# Patient Record
Sex: Male | Born: 1960 | Race: White | Hispanic: No | Marital: Single | Smoking: Current every day smoker
Health system: Southern US, Community
[De-identification: ages and names within clinical notes are randomized; demographics above are authoritative.]

## PROBLEM LIST (undated history)

## (undated) DIAGNOSIS — E785 Hyperlipidemia, unspecified: Secondary | ICD-10-CM

## (undated) DIAGNOSIS — E119 Type 2 diabetes mellitus without complications: Secondary | ICD-10-CM

## (undated) DIAGNOSIS — Z720 Tobacco use: Secondary | ICD-10-CM

## (undated) DIAGNOSIS — R0602 Shortness of breath: Secondary | ICD-10-CM

## (undated) DIAGNOSIS — E162 Hypoglycemia, unspecified: Secondary | ICD-10-CM

## (undated) DIAGNOSIS — I2510 Atherosclerotic heart disease of native coronary artery without angina pectoris: Secondary | ICD-10-CM

## (undated) DIAGNOSIS — I10 Essential (primary) hypertension: Secondary | ICD-10-CM

## (undated) DIAGNOSIS — E559 Vitamin D deficiency, unspecified: Secondary | ICD-10-CM

## (undated) DIAGNOSIS — F329 Major depressive disorder, single episode, unspecified: Secondary | ICD-10-CM

## (undated) DIAGNOSIS — R45851 Suicidal ideations: Secondary | ICD-10-CM

## (undated) DIAGNOSIS — F1911 Other psychoactive substance abuse, in remission: Secondary | ICD-10-CM

## (undated) DIAGNOSIS — F32A Depression, unspecified: Secondary | ICD-10-CM

## (undated) DIAGNOSIS — R609 Edema, unspecified: Secondary | ICD-10-CM

## (undated) DIAGNOSIS — J4489 Other specified chronic obstructive pulmonary disease: Secondary | ICD-10-CM

## (undated) DIAGNOSIS — K219 Gastro-esophageal reflux disease without esophagitis: Secondary | ICD-10-CM

## (undated) DIAGNOSIS — J449 Chronic obstructive pulmonary disease, unspecified: Secondary | ICD-10-CM

## (undated) DIAGNOSIS — R079 Chest pain, unspecified: Secondary | ICD-10-CM

## (undated) HISTORY — DX: Essential (primary) hypertension: I10

## (undated) HISTORY — DX: Shortness of breath: R06.02

## (undated) HISTORY — PX: LOOP RECORDER IMPLANT: SHX5954

## (undated) HISTORY — DX: Edema, unspecified: R60.9

## (undated) HISTORY — DX: Atherosclerotic heart disease of native coronary artery without angina pectoris: I25.10

## (undated) HISTORY — DX: Other psychoactive substance abuse, in remission: F19.11

## (undated) HISTORY — DX: Vitamin D deficiency, unspecified: E55.9

## (undated) HISTORY — PX: OTHER SURGICAL HISTORY: SHX169

## (undated) HISTORY — DX: Suicidal ideations: R45.851

## (undated) HISTORY — DX: Chronic obstructive pulmonary disease, unspecified: J44.9

## (undated) HISTORY — PX: CHOLECYSTECTOMY: SHX55

## (undated) HISTORY — DX: Tobacco use: Z72.0

## (undated) HISTORY — DX: Hyperlipidemia, unspecified: E78.5

## (undated) HISTORY — DX: Chest pain, unspecified: R07.9

## (undated) HISTORY — DX: Major depressive disorder, single episode, unspecified: F32.9

## (undated) HISTORY — DX: Gastro-esophageal reflux disease without esophagitis: K21.9

## (undated) HISTORY — DX: Other specified chronic obstructive pulmonary disease: J44.89

## (undated) HISTORY — DX: Depression, unspecified: F32.A

## (undated) HISTORY — DX: Hypoglycemia, unspecified: E16.2

---

## 1999-05-09 ENCOUNTER — Ambulatory Visit (HOSPITAL_COMMUNITY): Admission: RE | Admit: 1999-05-09 | Discharge: 1999-05-09 | Payer: Self-pay | Admitting: Neurosurgery

## 1999-05-09 ENCOUNTER — Encounter: Payer: Self-pay | Admitting: Neurosurgery

## 1999-05-19 ENCOUNTER — Encounter: Payer: Self-pay | Admitting: Neurosurgery

## 1999-05-21 ENCOUNTER — Encounter: Payer: Self-pay | Admitting: Neurosurgery

## 1999-05-21 ENCOUNTER — Inpatient Hospital Stay (HOSPITAL_COMMUNITY): Admission: RE | Admit: 1999-05-21 | Discharge: 1999-05-22 | Payer: Self-pay | Admitting: Neurosurgery

## 1999-11-20 ENCOUNTER — Ambulatory Visit (HOSPITAL_COMMUNITY): Admission: RE | Admit: 1999-11-20 | Discharge: 1999-11-20 | Payer: Self-pay | Admitting: Neurosurgery

## 1999-11-20 ENCOUNTER — Encounter: Payer: Self-pay | Admitting: Neurosurgery

## 2000-02-23 ENCOUNTER — Ambulatory Visit (HOSPITAL_COMMUNITY): Admission: RE | Admit: 2000-02-23 | Discharge: 2000-02-23 | Payer: Self-pay | Admitting: Neurosurgery

## 2000-02-23 ENCOUNTER — Encounter: Payer: Self-pay | Admitting: Neurosurgery

## 2000-03-07 ENCOUNTER — Encounter: Payer: Self-pay | Admitting: Neurosurgery

## 2000-03-07 ENCOUNTER — Inpatient Hospital Stay (HOSPITAL_COMMUNITY): Admission: RE | Admit: 2000-03-07 | Discharge: 2000-03-10 | Payer: Self-pay | Admitting: Neurosurgery

## 2001-05-25 ENCOUNTER — Emergency Department (HOSPITAL_COMMUNITY): Admission: EM | Admit: 2001-05-25 | Discharge: 2001-05-25 | Payer: Self-pay | Admitting: Emergency Medicine

## 2001-05-25 ENCOUNTER — Encounter: Payer: Self-pay | Admitting: Emergency Medicine

## 2002-01-14 ENCOUNTER — Encounter: Payer: Self-pay | Admitting: *Deleted

## 2002-01-14 ENCOUNTER — Ambulatory Visit (HOSPITAL_COMMUNITY): Admission: RE | Admit: 2002-01-14 | Discharge: 2002-01-14 | Payer: Self-pay | Admitting: *Deleted

## 2002-01-22 ENCOUNTER — Inpatient Hospital Stay (HOSPITAL_COMMUNITY): Admission: EM | Admit: 2002-01-22 | Discharge: 2002-01-25 | Payer: Self-pay | Admitting: Psychiatry

## 2002-03-08 ENCOUNTER — Encounter: Payer: Self-pay | Admitting: Neurosurgery

## 2002-03-12 ENCOUNTER — Inpatient Hospital Stay (HOSPITAL_COMMUNITY): Admission: RE | Admit: 2002-03-12 | Discharge: 2002-03-15 | Payer: Self-pay | Admitting: Neurosurgery

## 2002-03-12 ENCOUNTER — Encounter: Payer: Self-pay | Admitting: Neurosurgery

## 2005-06-28 ENCOUNTER — Emergency Department (HOSPITAL_COMMUNITY): Admission: EM | Admit: 2005-06-28 | Discharge: 2005-06-28 | Payer: Self-pay | Admitting: Emergency Medicine

## 2005-08-21 ENCOUNTER — Emergency Department (HOSPITAL_COMMUNITY): Admission: EM | Admit: 2005-08-21 | Discharge: 2005-08-21 | Payer: Self-pay | Admitting: Emergency Medicine

## 2005-10-01 ENCOUNTER — Ambulatory Visit (HOSPITAL_COMMUNITY): Admission: RE | Admit: 2005-10-01 | Discharge: 2005-10-01 | Payer: Self-pay | Admitting: Neurosurgery

## 2005-12-31 ENCOUNTER — Ambulatory Visit (HOSPITAL_COMMUNITY): Admission: RE | Admit: 2005-12-31 | Discharge: 2005-12-31 | Payer: Self-pay | Admitting: Neurosurgery

## 2006-01-06 ENCOUNTER — Ambulatory Visit (HOSPITAL_BASED_OUTPATIENT_CLINIC_OR_DEPARTMENT_OTHER): Admission: RE | Admit: 2006-01-06 | Discharge: 2006-01-07 | Payer: Self-pay | Admitting: Orthopaedic Surgery

## 2006-07-05 ENCOUNTER — Inpatient Hospital Stay (HOSPITAL_COMMUNITY): Admission: RE | Admit: 2006-07-05 | Discharge: 2006-07-07 | Payer: Self-pay | Admitting: Neurosurgery

## 2006-12-01 ENCOUNTER — Emergency Department (HOSPITAL_COMMUNITY): Admission: EM | Admit: 2006-12-01 | Discharge: 2006-12-01 | Payer: Self-pay | Admitting: Emergency Medicine

## 2006-12-19 ENCOUNTER — Ambulatory Visit (HOSPITAL_COMMUNITY): Admission: RE | Admit: 2006-12-19 | Discharge: 2006-12-19 | Payer: Self-pay | Admitting: *Deleted

## 2006-12-20 ENCOUNTER — Inpatient Hospital Stay (HOSPITAL_COMMUNITY): Admission: EM | Admit: 2006-12-20 | Discharge: 2006-12-23 | Payer: Self-pay | Admitting: Emergency Medicine

## 2006-12-22 ENCOUNTER — Encounter (INDEPENDENT_AMBULATORY_CARE_PROVIDER_SITE_OTHER): Payer: Self-pay | Admitting: General Surgery

## 2007-02-22 ENCOUNTER — Emergency Department (HOSPITAL_COMMUNITY): Admission: EM | Admit: 2007-02-22 | Discharge: 2007-02-22 | Payer: Self-pay | Admitting: Emergency Medicine

## 2007-03-22 ENCOUNTER — Observation Stay (HOSPITAL_COMMUNITY): Admission: EM | Admit: 2007-03-22 | Discharge: 2007-03-23 | Payer: Self-pay | Admitting: Emergency Medicine

## 2007-04-04 ENCOUNTER — Emergency Department (HOSPITAL_COMMUNITY): Admission: EM | Admit: 2007-04-04 | Discharge: 2007-04-04 | Payer: Self-pay | Admitting: Emergency Medicine

## 2007-09-10 ENCOUNTER — Emergency Department (HOSPITAL_COMMUNITY): Admission: EM | Admit: 2007-09-10 | Discharge: 2007-09-10 | Payer: Self-pay | Admitting: Emergency Medicine

## 2007-12-21 ENCOUNTER — Emergency Department (HOSPITAL_COMMUNITY): Admission: EM | Admit: 2007-12-21 | Discharge: 2007-12-21 | Payer: Self-pay | Admitting: Emergency Medicine

## 2008-04-28 ENCOUNTER — Emergency Department (HOSPITAL_COMMUNITY): Admission: EM | Admit: 2008-04-28 | Discharge: 2008-04-29 | Payer: Self-pay | Admitting: Emergency Medicine

## 2008-07-21 ENCOUNTER — Emergency Department (HOSPITAL_COMMUNITY): Admission: EM | Admit: 2008-07-21 | Discharge: 2008-07-21 | Payer: Self-pay | Admitting: Emergency Medicine

## 2010-01-24 ENCOUNTER — Emergency Department (HOSPITAL_COMMUNITY): Admission: EM | Admit: 2010-01-24 | Discharge: 2010-01-24 | Payer: Self-pay | Admitting: Emergency Medicine

## 2010-04-17 ENCOUNTER — Encounter: Payer: Self-pay | Admitting: Neurosurgery

## 2010-04-19 ENCOUNTER — Encounter: Payer: Self-pay | Admitting: *Deleted

## 2010-06-09 LAB — BASIC METABOLIC PANEL
BUN: 8 mg/dL (ref 6–23)
CO2: 22 mEq/L (ref 19–32)
Calcium: 9.1 mg/dL (ref 8.4–10.5)
Chloride: 101 mEq/L (ref 96–112)
Creatinine, Ser: 0.9 mg/dL (ref 0.4–1.5)
GFR calc Af Amer: >60 mL/min (ref >60)
GFR calc non Af Amer: >60 mL/min (ref >60)
Glucose, Bld: 101 mg/dL — ABNORMAL HIGH (ref 70–99)
Potassium: 4 mEq/L (ref 3.5–5.1)
Sodium: 131 mEq/L — ABNORMAL LOW (ref 135–145)

## 2010-06-09 LAB — CBC
HCT: 49.2 % (ref 39.0–52.0)
Hemoglobin: 16.7 g/dL (ref 13.0–17.0)
MCH: 31.2 pg (ref 26.0–34.0)
MCHC: 33.9 g/dL (ref 30.0–36.0)
MCV: 92.2 fL (ref 78.0–100.0)
Platelets: 209 10*3/uL (ref 150–400)
RBC: 5.34 MIL/uL (ref 4.22–5.81)
RDW: 13.2 % (ref 11.5–15.5)
WBC: 11.1 10*3/uL — ABNORMAL HIGH (ref 4.0–10.5)

## 2010-06-09 LAB — TROPONIN I: Troponin I: 0.04 ng/mL (ref 0.00–0.06)

## 2010-06-09 LAB — DIFFERENTIAL
Basophils Absolute: 0.1 10*3/uL (ref 0.0–0.1)
Basophils Relative: 1 % (ref 0–1)
Eosinophils Absolute: 0.2 10*3/uL (ref 0.0–0.7)
Eosinophils Relative: 2 % (ref 0–5)
Lymphocytes Relative: 29 % (ref 12–46)
Lymphs Abs: 3.2 10*3/uL (ref 0.7–4.0)
Monocytes Absolute: 0.9 10*3/uL (ref 0.1–1.0)
Monocytes Relative: 8 % (ref 3–12)
Neutro Abs: 6.7 10*3/uL (ref 1.7–7.7)
Neutrophils Relative %: 61 % (ref 43–77)

## 2010-06-09 LAB — CK TOTAL AND CKMB (NOT AT ARMC)
CK, MB: 2.8 ng/mL (ref 0.3–4.0)
Relative Index: 1.4 (ref 0.0–2.5)
Total CK: 204 U/L (ref 7–232)

## 2010-07-13 LAB — BASIC METABOLIC PANEL
BUN: 8 mg/dL (ref 6–23)
CO2: 26 mEq/L (ref 19–32)
Calcium: 9.4 mg/dL (ref 8.4–10.5)
Chloride: 112 mEq/L (ref 96–112)
Creatinine, Ser: 0.88 mg/dL (ref 0.4–1.5)
GFR calc Af Amer: >60 mL/min (ref >60)
GFR calc non Af Amer: >60 mL/min (ref >60)
Glucose, Bld: 107 mg/dL — ABNORMAL HIGH (ref 70–99)
Potassium: 3.5 mEq/L (ref 3.5–5.1)
Sodium: 143 mEq/L (ref 135–145)

## 2010-07-13 LAB — CBC
HCT: 45.1 % (ref 39.0–52.0)
Hemoglobin: 15.4 g/dL (ref 13.0–17.0)
MCHC: 34.1 g/dL (ref 30.0–36.0)
MCV: 96.3 fL (ref 78.0–100.0)
Platelets: 203 10*3/uL (ref 150–400)
RBC: 4.69 MIL/uL (ref 4.22–5.81)
RDW: 13.6 % (ref 11.5–15.5)
WBC: 10.3 10*3/uL (ref 4.0–10.5)

## 2010-07-13 LAB — DIFFERENTIAL
Basophils Absolute: 0 10*3/uL (ref 0.0–0.1)
Basophils Relative: 0 % (ref 0–1)
Eosinophils Absolute: 0.3 10*3/uL (ref 0.0–0.7)
Eosinophils Relative: 3 % (ref 0–5)
Lymphocytes Relative: 30 % (ref 12–46)
Lymphs Abs: 3.1 10*3/uL (ref 0.7–4.0)
Monocytes Absolute: 0.6 10*3/uL (ref 0.1–1.0)
Monocytes Relative: 6 % (ref 3–12)
Neutro Abs: 6.2 10*3/uL (ref 1.7–7.7)
Neutrophils Relative %: 61 % (ref 43–77)

## 2010-07-13 LAB — POCT CARDIAC MARKERS
CKMB, poc: 1.9 ng/mL (ref 1.0–8.0)
CKMB, poc: 3.5 ng/mL (ref 1.0–8.0)
Myoglobin, poc: 101 ng/mL (ref 12–200)
Myoglobin, poc: 70.3 ng/mL (ref 12–200)
Troponin i, poc: <0.05 ng/mL (ref 0.00–0.09)
Troponin i, poc: <0.05 ng/mL (ref 0.00–0.09)

## 2010-08-10 NOTE — Discharge Summary (Signed)
NAME:  Michael Chambers, Michael Chambers                ACCOUNT NO.:  [id number]   MEDICAL RECORD NO.:  [id number]          PATIENT TYPE:  INP   LOCATION:  A326                          FACILITY:  [facility]   PHYSICIAN:  Barbaraann Barthel, M.D. DATE OF BIRTH:  1961/03/03   DATE OF ADMISSION:  12/20/2006  DATE OF DISCHARGE:  09/27/2008LH                               DISCHARGE SUMMARY   DIAGNOSIS:  Acute cholecystitis with cholelithiasis.   SECONDARY DIAGNOSES:  1. Anxiety.  2. Chronic back pain which is status post multiple back surgeries.      The patient is medically disabled from this.  3. Tobacco abuse.  4. History of asthma.  He uses albuterol inhaler.  5. Hypertension.   PROCEDURE:  Laparoscopic cholecystectomy on December 22, 2006.   HISTORY OF PRESENT ILLNESS:  This is a 50 year old white male who came  into the emergency room with at least a 49-month history of right upper  quadrant pain, nausea and vomiting.  This has been increasing over the  last several weeks.  He had a sonogram prior to his admission which  showed stones, and he had increasing pain which had necessitated his  admission to the emergency room.  He was admitted.  Hydration was begun.  Antibiotics were initiated, and his condition acquiesced enough so that  we took him to surgery with resolving pain on December 22, 2006 at  which time a laparoscopic cholecystectomy was carried out without any  problems.  Postoperatively, his course was completely benign.  At the  time of discharge, he was tolerating p.o. well.  He was moving about  well.  He was voiding without dysuria.  He had no leg pain or shortness  of breath, and he was doing well.   LABORATORY DATA:  White count on December 20, 2006 was 8.6 with an H&H  of 16.2 and 41.1, with 68% neutrophils.  Liver function studies were  grossly within normal limits.  At the time of discharge, his H&H was  14.4 and 43.0.  His liver function studies remained within normal  limits.   Postoperatively, he did quite well and was discharged.   DISCHARGE INSTRUCTIONS:  He was told to resume his preoperative  medications as per the medication reconciliation sheet.  He is  discharged with Darvocet N 100 1 tablet every 4 hours as needed for  pain.  He was told to avoid any aspirin products.  His other medications  include Lexapro 10 mg p.o. daily, Tiazac 300 mg p.o. daily, Zocor 80 mg  1/2 tablet daily, Valium 10 mg daily, Percocet 5/325 (this was held),  and albuterol inhaler 90 mcg inhaler and nebulizers as needed.  He is to  take Darvocet, as mentioned above, instead of Percocet for pain.   We will follow him up perioperatively.  He is told to come to my office  on Thursday, December 28, 2006 at 10 o'clock or go to the emergency room  should there be any acute changes.  Other instructions include cleaning  his wound with alcohol 3 times a day and dressing his drain site as  needed.  He is discharged on a full liquid and soft diet.  He is told  to increase his activity as tolerated.  He is permitted walk indoors and  outdoors and up and down the stairs.  He is told to do no heavy lifting,  no driving, and no vigorous sexual activity.  We will follow him  perioperatively after which he is to return to his medical doctor.      Barbaraann Barthel, M.D.  Electronically Signed     WB/MEDQ  D:  12/23/2006  T:  12/24/2006  Job:  [id number]   cc:   Alvin Critchley, M.D.  [address]  [place], [place] [address]

## 2010-08-10 NOTE — Op Note (Signed)
NAME:  Michael Chambers, Michael Chambers                ACCOUNT NO.:  [id number]   MEDICAL RECORD NO.:  [id number]          PATIENT TYPE:  INP   LOCATION:  A326                          FACILITY:  [facility]   PHYSICIAN:  Barbaraann Barthel, M.D. DATE OF BIRTH:  April 05, 1960   DATE OF PROCEDURE:  12/22/2006  DATE OF DISCHARGE:                               OPERATIVE REPORT   SURGEON:  Dr. Malvin Johns.   PREOPERATIVE DIAGNOSIS:  Acute cholecystitis, cholelithiasis.   POSTOPERATIVE DIAGNOSIS:  Acute cholecystitis, cholelithiasis.   PROCEDURE:  Laparoscopic cholecystectomy.   SPECIMEN:  Gallbladder with stones.   NOTE:  This a 50 year old male who was admitted through the emergency  room with right upper quadrant pain with nausea and vomiting.  Sonogram  revealed the presence of stones.  He was admitted, and he was hydrated,  and antibiotics were initiated.  He was taken to surgery.  After  discussing the complications not limited to but including bleeding,  infection, damage to bile ducts, perforation of organs and transitory  diarrhea, and the possibility that open cholecystectomy may be required.  Informed consent was obtained.   GROSS OPERATIVE FINDINGS:  The patient had a very elongated cystic duct,  very small.  This was not cannulated.  The gallbladder was distended.  At least 4 large stones, one as large as a marble and the other ones a  little less size then this were encountered.  The right upper quadrant  apart from dense adhesions about the Hartmann's pouch was grossly within  normal limits.   TECHNIQUE:  The patient was placed in supine position.  After the  adequate administration of general anesthesia via endotracheal  intubation, his entire abdomen was prepped with Betadine solution and  draped in usual manner.  A Foley catheter was aseptically inserted.  An  incision was carried out over the superior aspect of the umbilicus down  to the fascia which was elevated with a sharp towel clip, and a  Veress  needle was inserted and confirmed in position with a saline drop test.  We then insufflated the abdomen with approximately 4 liters of CO2 and  then grasped the gallbladder, took down its adhesions, identified the  cystic duct and triply silver clipped this as well as the cystic artery  and removed this from the liver bed uneventfully.  There was a small  amount of spillage.  This was easily controlled, and we removed the  gallbladder and the stones with the Endosac device.  We then checked for  hemostasis, irrigated again.  I elected to leave a Jackson-Pratt drain  in the liver bed and Surgicel as well in the liver bed.  We then  desufflated the abdomen and closed the incisions using 0 Polysorb in the  area of the umbilicus and the epigastrium and used 1/2% Sensorcaine for  all port sites.  The drain exited from one of the 5-mm drain sites in  the  right upper quadrant laterally.  The Jackson-Pratt drain was sutured in  place with 3-0 nylon.  A sterile dressing was applied.  Prior to  closure, all sponge, needle  and instrument counts were found to be  correct.  Estimated blood loss was minimal.  The patient received 750 mL  crystalloids intraoperatively.  There were no complications.      Barbaraann Barthel, M.D.  Electronically Signed     WB/MEDQ  D:  12/22/2006  T:  12/22/2006  Job:  [id number]   cc:   Barbaraann Barthel, M.D.  Fax: [phone]   Alvin Critchley, M.D.  [address]  [place], [place] [address]
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
## 2010-08-13 NOTE — Discharge Summary (Signed)
[place]. [facility]  Patient:    Michael Chambers                       MRN: [id number] Adm. Date:  [id number] Disc. Date: [id number] Attending:  Emeterio Reeve                           Discharge Summary  REASON FOR ADMISSION:  Herniated disk L3-4, left.  FINAL DIAGNOSIS:  Herniated disk L3-4, left.  HISTORY OF PRESENT ILLNESS AND HOSPITAL COURSE:  Michael Chambers is a 50 year old man who is a patient of Dr. Rolan Bucco who was found to have a disk herniation at L3-4 on  the left.  He was admitted on same-day-as-procedure basis and underwent uncomplicated L3-4 hemi/semilaminectomy and diskectomy.  Postoperatively, he had good relief of his left leg pain.  He was up and ambulating well and was doing ell on the morning of February 24 and was discharged home in stable and satisfactory condition having tolerated the operation and hospitalization well.  DISCHARGE MEDICATIONS: 1. Vicodin 5/500 one to two every 4 hours as needed for pain. 2. Flexeril 10 mg up to 3 times daily if needed for spasm.  ACTIVITY:  No lifting, bending, twisting, or driving.  WOUND CARE:  Keep wound clean and dry.  Okay to shower but not to soak his incision.  FOLLOWUP:  In 10 to 14 days with Dr. Channing Mutters for suture removal.  Call on Monday for an appointment. DD:  05/22/99 TD:  05/23/99 Job: [id number]
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
## 2010-08-13 NOTE — Op Note (Signed)
[place]. [facility]  Patient:    Michael Chambers, Michael Chambers                       MRN: [id number] Proc. Date: 05/21/99 Adm. Date:  [id number] Disc. Date: [id number] Attending:  Emeterio Reeve                           Operative Report  PREOPERATIVE DIAGNOSIS:  Herniated disk at L3-4, left.  POSTOPERATIVE DIAGNOSIS:  Herniated disk at L3-4, left.  OPERATION PROCEDURE:  L3-4 laminectomy and diskectomy.  SURGEON:  Payton Doughty, M.D.  SERVICE:  Neurosurgery.  COMPLICATIONS:  None.  BODY OF TEXT:  A 50 year old gentleman with a left L4 radiculopathy and herniated disk at L3-4.  He was taken to the operating room.  Smooth anesthesia and intubated.  Placed prone on the operating table.  Following shave, prep, and drape in usual sterile fashion, his old skin incision was extended cephalic by approximately 3 cm.  The lamina of L3 was exposed, and and intraoperative x-ray was obtained to confirm correctness of level.  Hemi/semi-laminectomy of L3 was then  carried out to the top ligamentum flavum, which was removed in a retrograde fashion.  The L4 root was identified, and lateral dissection revealed a large, herniated disk, which was removed without difficulty.  Following removal of the  disk, and the nerve root was carefully explored.  Then the disk space was explored to make sure that there ______  marginally clinging fragments.  Following this,  the wound was irrigated.  Hemostasis assured.  The nerve root was covered with Depo-Medrol soaked fat.  The fascia was reapproximated with #0 Vicryl in interrupted fashion.  The subcutaneous tissue was reapproximated with #0 Vicryl in interrupted fashion.  The subcuticular tissues were reapproximated with 3-0 Vicryl in interrupted fashion.  The skin was closed with 3-0 nylon in a running locking fashion.  Betadine towel dressing was applied and made occlusive with OpSite. he patient was returned to the recovery room in  good condition. DD:  06/25/99 TD:  06/25/99 Job: [id number]

## 2010-08-13 NOTE — Op Note (Signed)
   NAME:  Michael Chambers, Michael Chambers NO.:  [id number]   MEDICAL RECORD NO.:  [id number]                   PATIENT TYPE:  INP   LOCATION:  3031                                 FACILITY:  [facility]   PHYSICIAN:  Payton Doughty, M.D.                   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   OPERATION PERFORMED:  Lumbar 3-4 nonsegmental pedicle screw fixation and  posterolateral arthrodesis.   SURGEON:  Payton Doughty, M.D.   ASSISTANT:  Annie Sable, M.D.   SERVICE:  Neurosurgery.   ANESTHESIA:  General endotracheal.   PREPARATION:  Sterile prep with Betadine and alcohol wipe.   COMPLICATIONS:  None.   DESCRIPTION OF OPERATION:  This is a 50 year old gentleman with a nonunion  at L3-4.  He was brought to the operating room, anesthetized and intubated,  and placed prone on the operating table.  The flank was shaved, prepped and  draped in the usual sterile fashion.  The skin was incised from mid L4 to  mid L2, and the transverse processes of  L3 and L4 were exposed bilaterally  in the subperiosteal plane along with the superior pedicles of L3 and L4.  Using the standard landmarks pedicle screws were placed in the pedicles of  L3 and L4.  Intraoperative x-ray confirmed good placement of pedicle screws.  The linking rods were placed and the caps placed.  The transversae processes  were decorticated with the high-speed drill, and DBX and autologous bone  were mixed and placed over them for the posterolateral arthrodesis.  The  fascia was reapproximated with 0-Vicryl in interrupted fashion.  Subcutaneous tissue was reapproximated with 0-Vicryl in interrupted fashion.  Subcuticular tissues were reapproximated with 2-0 Vicryl in interrupted  fashion and the skin was closed with  3-0 nylon in running locked fashion.  A  bacitracin Telfa dressing was applied and made occlusive with Op-Site.  The  patient was taken to the recovery room in good condition.                                               Payton Doughty, M.D.    MWR/MEDQ  D:  03/12/2002  T:  03/12/2002  Job:  [id number]
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
## 2010-08-13 NOTE — Op Note (Signed)
NAME:  Michael Chambers, Michael Chambers NO.:  [id number]   MEDICAL RECORD NO.:  [id number]          PATIENT TYPE:  INP   LOCATION:  3004                         FACILITY:  [facility]   PHYSICIAN:  Payton Doughty, M.D.      DATE OF BIRTH:  1960/07/15   DATE OF PROCEDURE:  07/05/2006  DATE OF DISCHARGE:                               OPERATIVE REPORT   PREOPERATIVE DIAGNOSIS:  Spondylosis, L2-3.   POSTOPERATIVE DIAGNOSIS:  Spondylosis, L2-3.   OPERATIVE PROCEDURE:  Bilateral L2-3 laminotomy and foraminotomy.   SURGEON:  Payton Doughty, M.D.   ANESTHESIA:  General endotracheal ________.   COMPLICATIONS:  None.   NURSE ASSISTANT:  [place].   DOCTOR ASSISTANT:  Coletta Memos, M.D.   This is a 50 year old gentleman who has had prior fusions from L3 to S1  and now has spondylosis at L2-3, taken to the operating room and  sequentially anesthetized and intubated, placed prone on the operating  room table.  Following standard prep and drape in the usual sterile  fashion.  The skin was incised in the midline starting to middle of L1  down to the middle of L3 and the lamina of L2 were exposed bilaterally  in the subperiosteal plane out to the pedicle screws that were in the  pedicle of L2.  Intraoperative x-ray confirmed correct level.  Laminotomy and foraminotomy was carried out with a high speed drill and  the [place].  This was carried out to the top of the ligamentum flavum.  It was removed in a retrograde fashion.  A large portion of the pars  intra-articularis was left to assure that would not fracture.  This  allowed decompression of the 2 and 3 roots as they traversed this area.  The 2 foramen was opened.  The L3 nerve root was decompressed in the  lateral recess.  Following complete decompression, the wound was  irrigated and hemostasis assured, and the laminotomy defect filled with  Depo-Medrol subfat.  Successive layers of 0 Vicryl, 2-0 Vicryl and 3-0  nylon was used to close.   Betadine and Telfa dressing was applied, re-  occlusive to the operative site, and the patient returned to the  recovery room in good condition.           ______________________________  Payton Doughty, M.D.     MWR/MEDQ  D:  07/05/2006  T:  07/05/2006  Job:  [id number]
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
## 2010-08-13 NOTE — Op Note (Signed)
NAME:  Michael Chambers, Michael Chambers                ACCOUNT NO.:  [id number]   MEDICAL RECORD NO.:  [id number]          PATIENT TYPE:  AMB   LOCATION:  DSC                          FACILITY:  [facility]   PHYSICIAN:  Claude Manges. Whitfield, M.D.DATE OF BIRTH:  1960-05-18   DATE OF PROCEDURE:  01/06/2006  DATE OF DISCHARGE:  01/06/2006                                 OPERATIVE REPORT   PREOPERATIVE DIAGNOSES:  1. Impingement of left shoulder with rotator cuff tear.  2. Degenerative joint disease of acromioclavicular joint.   POSTOPERATIVE DIAGNOSES:  1. Impingement of left shoulder with rotator cuff tear.  2. Degenerative joint disease of acromioclavicular joint.   PROCEDURE:  1,  Arthroscopic debridement of left shoulder.  1. Arthroscopic subacromial decompression.  2. Mini open rotator cuff tear repair.  3. Arthroscopic distal left clavicle resection.   SURGEON:  Claude Manges. Cleophas Dunker, M.D.   ASSISTANT:  Arlys John D. Petrarca, P.A.-C.   ANESTHESIA:  General orotracheal without interscalene nerve block.   COMPLICATIONS:  None.   HISTORY:  A 50 year old gentleman who was recently seen the office for  evaluation of left shoulder pain.  He had positive impingement and  tenderness over the greater tuberosity.  He had not responded the time and  anti-inflammatory medicines.  An MRI scan was performed revealing a rotator  cuff tear involving the supraspinatus with retraction.  There was a type II  acromion with clinical diagnosis of impingement.  He is now to have an  arthroscopic evaluation.  He does have some mild degenerative change at the  [facility] joint that is to be evaluated at the time of surgery.   PROCEDURE:  With the patient comfortable on the operating table and under  general orotracheal anesthesia, the patient was placed in a semi-sitting  position with the shoulder frame.  The left shoulder was then prepped with  DuraPrep from the base of the neck circumferentially below the elbow.  Sterile draping  was performed.   A marking pen was used to outline the [facility] joint, the coracoid and the  acromion.  At a point a fingerbreadth posterior and medial to the posterior  angle of the acromion, a small stab wound was made.  The arthroscope was  then easily placed into the shoulder joint posteriorly.  Diagnostic  arthroscopy revealed a diffuse beefy-red synovitis.  There appeared to be  some tearing of the biceps tendon with some frayed fibers and there was an  obvious rotator cuff tear; it was such that I could visualize through the  cuff tear.  The labrum appeared to be intact; I did not see any loose  bodies.   A second portal was then established anteriorly with the ribbed cannula.  Shaving of the synovitis was performed and then completed with the  Arthrocare wand; any frayed portions of the biceps tendon were easily  debrided.  At least 75% of the tendon was intact.  There was very minimal  chondromalacia and I again did not see any loose bodies.   The arthroscope was then placed in the subacromial space posteriorly with a  cannula in  the subacromial space anteriorly and an arthroscopic subacromial  decompression was performed using the Arthrocare wand, the shaver and the 6-  mm bur through a third portal established in the lateral subacromial space.  There was abundant bursal tissue.  As I evaluated the distal clavicle, it  appeared to be impinging.  The majority of it seemed to be below the level  of the acromion and with some areas of chondromalacia, I felt the best  approach was a distal clavicle resection; this was accomplished with a 6-mm  bur with a very nice resection.   There was obvious overhang of the anterior acromion impinging on the cuff  and this had a very nice decompression with a 6-mm bur.  Any bleeding was  controlled with the Arthrocare wand.  At that point, I could visualize a  rotator cuff tear, but there did not appear to be any impingement.   The arthroscope was  then removed, as was the cannula, and I proceeded with a  mini open rotator cuff tear repair.   Just over an inch incision was made along the anterolateral aspect of the  shoulder and via sharp dissection, carried down through subcutaneous tissue.  The deltoid fascia was incised and the fibers were separated along their  longitudinal line bluntly and such that I could enter the subacromial space.  A self-retaining retractor was inserted.  There was still some bursal tissue  remaining which I resected, at which point I could more easily evaluate the  rotator cuff tear; it did involve the supraspinatus.  It was torn about a  centimeter and a half along the humeral head and extended back about the  same distance in a V-shaped fashion.  The edges were sharply debrided.  I  visualize the biceps tendon; there were some areas that were frayed, which I  debrided, but the majority was intact.  I established some bleeding bone  along the humeral head in the area of the cuff tear and then performed a  rotator cuff repair with interrupted 0 Ethibond and then a single Mitek  anchor with an excellent repair and again, no evidence of impingement to  finger palpation.  The wound was then copiously irrigated with saline  solution.  Marcaine with epinephrine was injected into the wound edges.  The  deltoid fascia was closed with a running 0 Vicryl, the subcuticular with 2-0  Vicryl, the skin closed with Steri-Strips.  A sterile bulky dressing was  applied followed by sling.   PLAN:  Recovery Care, Percocet for pain, office in 1 week.      Claude Manges. Cleophas Dunker, M.D.  Electronically Signed     PWW/MEDQ  D:  01/06/2006  T:  01/09/2006  Job:  [id number]

## 2010-08-13 NOTE — Op Note (Signed)
[place]  Patient:    Michael Chambers, Michael Chambers                       MRN: [id number] Proc. Date: 03/07/00 Adm. Date:  [id number] Attending:  Emeterio Reeve                           Operative Report  PREOPERATIVE DIAGNOSIS:  Spondylosis at L3-4.  POSTOPERATIVE DIAGNOSIS:  Spondylosis at L3-4.  PROCEDURE:  L3-4 laminectomy and diskectomy, posterior lumbar interbody fusion with Ray threaded fusion cage.  SURGEON:  Payton Doughty, M.D.  ANESTHESIA:  General endotracheal.  PREPARATION:  Sterile Betadine prep with alcohol wipe.  COMPLICATIONS:  None.  INDICATION:  A 50 year old gentleman with severe spondylosis at L3-4 and positive discography.  DESCRIPTION OF PROCEDURE:  He was taken to the operating room, smoothly anesthetized and intubated, and placed prone on the operating table. Following shave, prep, and drape in the usual sterile fashion, skin was infiltrated with 1% lidocaine with 1:400,000 epinephrine, and the L3-4 diskectomy incision was reopened.  The lamina of L3 was identified bilaterally by its laminectomy defect on the left side.  Intraoperative x-ray was taken to confirm correctness of the level.  The pars interarticularis and remaining lamina on the left as well as the superior facet and the pars interarticularis, lamina, and inferior facet on the right were removed.  The superior facet of L4 was also removed with a drill and the bone set aside for grafting.  The 3 root and the 4 root were dissected free bilaterally.  On the right side, there was a lot of scarring around the 3 root and the 4 root and a large annular defect right in front of it.  Diskectomy was carried out.  Ray threaded fusion cages were then placed, 14 x 26 mm, bilaterally. Intraoperative x-ray showed good placement of cages.  The wound was irrigated and hemostasis assured and the cages packed with bone harvested from the facet joints.  They were capped.  The fascia  was reapproximated with 0 Vicryl in interrupted fashion, subcutaneous tissues reapproximated with 0 Vicryl in interrupted fashion.  Subcuticular tissues reapproximated with 3-0 Vicryl in interrupted fashion, and the skin was closed with 3-0 nylon in a running locked fashion.  Betadine and Telfa dressing was applied and made occlusive with OpSite.  The patient then returned to the recovery room in good condition. DD:  03/07/00 TD:  03/07/00 Job: [id number]
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
## 2010-08-16 ENCOUNTER — Encounter: Payer: Self-pay | Admitting: Cardiology

## 2010-08-16 DIAGNOSIS — I2510 Atherosclerotic heart disease of native coronary artery without angina pectoris: Secondary | ICD-10-CM

## 2010-08-17 ENCOUNTER — Encounter: Payer: Self-pay | Admitting: Cardiology

## 2010-08-17 ENCOUNTER — Ambulatory Visit (INDEPENDENT_AMBULATORY_CARE_PROVIDER_SITE_OTHER): Payer: 59 | Admitting: Cardiology

## 2010-08-17 ENCOUNTER — Encounter: Payer: Self-pay | Admitting: *Deleted

## 2010-08-17 VITALS — BP 91/55 | HR 71 | Resp 18 | Ht 69.0 in | Wt 212.4 lb

## 2010-08-17 DIAGNOSIS — F17200 Nicotine dependence, unspecified, uncomplicated: Secondary | ICD-10-CM | POA: Insufficient documentation

## 2010-08-17 DIAGNOSIS — Z720 Tobacco use: Secondary | ICD-10-CM | POA: Insufficient documentation

## 2010-08-17 DIAGNOSIS — R079 Chest pain, unspecified: Secondary | ICD-10-CM | POA: Insufficient documentation

## 2010-08-17 DIAGNOSIS — I10 Essential (primary) hypertension: Secondary | ICD-10-CM | POA: Insufficient documentation

## 2010-08-17 DIAGNOSIS — E785 Hyperlipidemia, unspecified: Secondary | ICD-10-CM | POA: Insufficient documentation

## 2010-08-17 DIAGNOSIS — R0602 Shortness of breath: Secondary | ICD-10-CM | POA: Insufficient documentation

## 2010-08-17 MED ORDER — AMLODIPINE BESYLATE 10 MG PO TABS: ORAL_TABLET | ORAL | Status: DC

## 2010-08-17 MED ORDER — NICOTINE 14 MG/24HR TD PT24: 1.0000 | MEDICATED_PATCH | TRANSDERMAL | Status: AC

## 2010-08-17 MED ORDER — NICOTINE 14 MG/24HR TD PT24: 1.0000 | MEDICATED_PATCH | TRANSDERMAL | Status: DC

## 2010-08-17 NOTE — Assessment & Plan Note (Signed)
His blood pressure is actually running low and he will reduce his amlodipine 5 mg daily.

## 2010-08-17 NOTE — Assessment & Plan Note (Signed)
Greater than 3 minutes of counseling. I will write a prescription for nicotine patches.

## 2010-08-17 NOTE — Assessment & Plan Note (Signed)
The patient has chest pain with coronary calcification advanced for his age and an abnormal stress test. Cardiac catheterization is indicated. I reviewed extensively the risks and benefits of this and he agrees to proceed.
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
## 2010-08-17 NOTE — Assessment & Plan Note (Signed)
He will need a fasting lipid profile.  However, I will wait until he has been on pravastatin for about 3 months.

## 2010-08-17 NOTE — Patient Instructions (Signed)
   Left heart catherization scheduled for Thursday, June 7   14mg  Nicotine patch   Decrease Amlodipine 5mg  daily Follow up will be given at time of discharge from cath

## 2010-08-26 LAB — PROTIME-INR

## 2010-09-02 ENCOUNTER — Inpatient Hospital Stay (HOSPITAL_BASED_OUTPATIENT_CLINIC_OR_DEPARTMENT_OTHER)
Admission: RE | Admit: 2010-09-02 | Discharge: 2010-09-02 | Disposition: A | Discharge status: Home / Self Care | Payer: PRIVATE HEALTH INSURANCE | Source: Ambulatory Visit | Attending: Cardiology | Admitting: Cardiology

## 2010-09-02 ENCOUNTER — Encounter: Payer: Self-pay | Admitting: *Deleted

## 2010-09-02 DIAGNOSIS — I2510 Atherosclerotic heart disease of native coronary artery without angina pectoris: Secondary | ICD-10-CM | POA: Insufficient documentation

## 2010-09-02 DIAGNOSIS — R079 Chest pain, unspecified: Secondary | ICD-10-CM | POA: Insufficient documentation

## 2010-09-02 DIAGNOSIS — R9430 Abnormal result of cardiovascular function study, unspecified: Secondary | ICD-10-CM

## 2010-09-02 HISTORY — PX: CARDIAC CATHETERIZATION: SHX172

## 2010-09-13 ENCOUNTER — Other Ambulatory Visit: Payer: Self-pay | Admitting: *Deleted

## 2010-09-13 ENCOUNTER — Encounter: Payer: Self-pay | Admitting: Cardiovascular Disease

## 2010-09-13 ENCOUNTER — Ambulatory Visit (INDEPENDENT_AMBULATORY_CARE_PROVIDER_SITE_OTHER): Payer: PRIVATE HEALTH INSURANCE | Admitting: Cardiovascular Disease

## 2010-09-13 DIAGNOSIS — Z720 Tobacco use: Secondary | ICD-10-CM

## 2010-09-13 DIAGNOSIS — F17200 Nicotine dependence, unspecified, uncomplicated: Secondary | ICD-10-CM

## 2010-09-13 DIAGNOSIS — E785 Hyperlipidemia, unspecified: Secondary | ICD-10-CM

## 2010-09-13 DIAGNOSIS — Z79899 Other long term (current) drug therapy: Secondary | ICD-10-CM

## 2010-09-13 DIAGNOSIS — R599 Enlarged lymph nodes, unspecified: Secondary | ICD-10-CM

## 2010-09-13 DIAGNOSIS — R591 Generalized enlarged lymph nodes: Secondary | ICD-10-CM

## 2010-09-13 DIAGNOSIS — R079 Chest pain, unspecified: Secondary | ICD-10-CM

## 2010-09-13 NOTE — Patient Instructions (Addendum)
Your physician wants you to follow-up in: 4 months. You will receive a reminder letter in the mail one-two months in advance. If you don't receive a letter, please call our office to schedule the follow-up appointment. Your physician recommends that you continue on your current medications as directed. Please refer to the Current Medication list given to you today. Your physician recommends that you go to the [facility] for a FASTING lipid profile and liver function labs. Do not eat or drink after midnight. If the results of your test are normal or stable, you will receive a letter. If they are abnormal, the nurse will contact you by phone.

## 2010-09-14 ENCOUNTER — Encounter: Payer: Self-pay | Admitting: Cardiovascular Disease

## 2010-09-14 DIAGNOSIS — R591 Generalized enlarged lymph nodes: Secondary | ICD-10-CM | POA: Insufficient documentation

## 2010-09-14 NOTE — Assessment & Plan Note (Signed)
The patient had an incidental finding on CT of the chest which showed thoracic lymphadenopathy with no masses. A followup CT scan was recommended in 3-6 months. I will have the patient come back for followup in 4 months from now. A CT scan of the chest will need to be ordered at that time. The patient is a smoker and have family history of cancer

## 2010-09-14 NOTE — Assessment & Plan Note (Signed)
Recent cardiac catheterization showed no evidence of obstructive coronary artery disease. He hasn't had any further chest pain. He was found moderate three-vessel coronary artery disease with significant plaque burden and calcifications. Thus, aggressive medical therapy is recommended. I had a prolonged discussion with him about this. Lifestyle changes including diet and exercise were discussed. Continue aspirin 81 mg once daily.

## 2010-09-14 NOTE — Assessment & Plan Note (Signed)
I had  a discussion with him about the importance of smoking cessation. He is trying to quit and using a patch.
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
## 2010-09-14 NOTE — Assessment & Plan Note (Signed)
Patient will need aggressive treatment of his lipids. He probably will need a more potent statin. Will request fasting lipid and liver profile.

## 2010-09-15 ENCOUNTER — Telehealth: Payer: Self-pay | Admitting: *Deleted

## 2010-09-15 NOTE — Telephone Encounter (Signed)
Message left on nurse voicemail to call. Nurse returned call and patient stated that he needed prescription for chantix sent to his pharmacy because the smoking patches he has now are breaking him out in a rash. Please advise.

## 2010-09-15 NOTE — Cardiovascular Report (Signed)
  NAME:  Michael Chambers, SENSING NO.:  [id number]  MEDICAL RECORD NO.:  [id number]  LOCATION:                                 FACILITY:  PHYSICIAN:  Rollene Rotunda, MD, FACCDATE OF BIRTH:  06/06/1970  DATE OF PROCEDURE:  09/02/2010 DATE OF DISCHARGE:                           CARDIAC CATHETERIZATION   PRIMARY CARE PHYSICIAN:  None.  CARDIOLOGIST:  Rollene Rotunda, MD, [facility]  PROCEDURE:  Left heart catheterization/coronary arteriography.  INDICATIONS:  The patient with chest pain.  He did have some questionable mild ischemia versus attenuation artifact in the inferior wall and basilar anterior septum.  PROCEDURE NOTE:  Left heart catheterization performed with right femoral artery.  The area was cannulated using the anterior wall puncture.  A number 4-French arterial sheath was inserted via the modified Seldinger technique.  Preformed Judkins and pigtail catheter were utilized.  The patient tolerated the procedure well and left the lab in stable condition.  HEMODYNAMICS:  Left main normal, LAD heavy calcification.  There was long proximal 40-50% stenosis and mid 40% stenosis.  This was a large vessel wrapping the apex.  There was a mid-diagonal, which was small and normal.  Ramus intermediate was small diffuse disease.  There was 30-40% stenosis.  The circumflex in the AV groove had moderate calcification with luminal irregularities.  Obtuse marginal 1 was small with ostial 40% stenosis, obtuse marginal 2 was moderate size and normal.  Obtuse marginal 3 was small with a long mid 50% stenosis.  Posterolateral had luminal irregularities.  The right coronary artery had mid 60% stenosis. The PDA was moderate size and normal.  LEFT VENTRICULOGRAM:  The left ventriculogram was obtained in the RAO projection.  The EF was 65% with normal wall motion.  CONCLUSION:  Nonobstructive coronary artery disease.  Preserved ejection fraction.  Diffuse plaque with  calcification.  PLAN:  The patient does not need acute intervention at this point. However, he needs aggressive primary risk reduction.  In particular, I discussed with him the need to stop smoking.     Rollene Rotunda, MD, [facility]     JH/MEDQ  D:  09/02/2010  T:  09/03/2010  Job:  [id number]  Electronically Signed by Rollene Rotunda MD [facility] on 09/15/2010 11:45:44 AM

## 2010-09-15 NOTE — Telephone Encounter (Signed)
Pt notified to d/w primary MD. Pt verbalized understanding.

## 2010-09-21 DIAGNOSIS — R079 Chest pain, unspecified: Secondary | ICD-10-CM

## 2010-09-28 ENCOUNTER — Encounter: Payer: Self-pay | Admitting: *Deleted

## 2010-10-04 ENCOUNTER — Telehealth: Payer: Self-pay | Admitting: *Deleted

## 2010-10-04 MED ORDER — ATORVASTATIN CALCIUM 40 MG PO TABS: 40.0000 mg | ORAL_TABLET | Freq: Every day | ORAL | Status: DC

## 2010-10-04 NOTE — Telephone Encounter (Signed)
Pt notified of results and verbalized understanding  

## 2010-10-04 NOTE — Telephone Encounter (Signed)
Message copied by Arlyss Gandy on Mon Oct 04, 2010  4:03 PM ------      Message from: Arlyss Gandy      Created: Wed Sep 22, 2010  3:53 PM       Pt currently inpt at [facility]

## 2010-10-04 NOTE — Telephone Encounter (Signed)
See handwritten note on report....  Left message to call back on voicemail.

## 2010-10-04 NOTE — Telephone Encounter (Signed)
Message copied by Arlyss Gandy on Mon Oct 04, 2010  3:39 PM ------      Message from: Arlyss Gandy      Created: Wed Sep 22, 2010  3:53 PM       Pt currently inpt at [facility]

## 2010-12-15 LAB — DIFFERENTIAL
Basophils Absolute: 0
Basophils Relative: 0
Eosinophils Absolute: 0.1
Eosinophils Relative: 1
Lymphocytes Relative: 24
Lymphs Abs: 2.9
Monocytes Absolute: 1
Monocytes Relative: 9
Neutro Abs: 7.9 — ABNORMAL HIGH
Neutrophils Relative %: 66

## 2010-12-15 LAB — B-NATRIURETIC PEPTIDE (CONVERTED LAB): Pro B Natriuretic peptide (BNP): <30

## 2010-12-15 LAB — BLOOD GAS, ARTERIAL
Acid-base deficit: 0.6
Bicarbonate: 23.6
FIO2: 21
O2 Saturation: 97.4
Patient temperature: 37
TCO2: 20.1
pCO2 arterial: 39.1
pH, Arterial: 7.399
pO2, Arterial: 90

## 2010-12-15 LAB — CBC
HCT: 48.7
Hemoglobin: 16.2
MCHC: 33.3
MCV: 95.2
Platelets: 201
RBC: 5.12
RDW: 14.2
WBC: 11.9 — ABNORMAL HIGH

## 2010-12-15 LAB — D-DIMER, QUANTITATIVE: D-Dimer, Quant: 0.26

## 2010-12-15 LAB — POCT CARDIAC MARKERS
CKMB, poc: 1.3
Myoglobin, poc: 51.9
Operator id: 207241
Troponin i, poc: <0.05

## 2010-12-31 LAB — POCT CARDIAC MARKERS
CKMB, poc: 1.3
CKMB, poc: 1.7
Myoglobin, poc: 48.1
Myoglobin, poc: 57.2
Operator id: 207241
Operator id: 207241
Troponin i, poc: <0.05
Troponin i, poc: <0.05

## 2010-12-31 LAB — DIFFERENTIAL
Basophils Absolute: 0
Basophils Relative: 0
Eosinophils Absolute: 0.2
Eosinophils Relative: 2
Lymphocytes Relative: 33
Lymphs Abs: 3.7
Monocytes Absolute: 0.6
Monocytes Relative: 5
Neutro Abs: 6.7
Neutrophils Relative %: 60

## 2010-12-31 LAB — CK TOTAL AND CKMB (NOT AT ARMC)
CK, MB: 2.9
CK, MB: 4.6 — ABNORMAL HIGH
Relative Index: 2.4
Relative Index: 2.6 — ABNORMAL HIGH
Total CK: 120
Total CK: 178

## 2010-12-31 LAB — HEPATIC FUNCTION PANEL
ALT: 36
AST: 46 — ABNORMAL HIGH
Albumin: 3.8
Alkaline Phosphatase: 59
Bilirubin, Direct: 0.1
Indirect Bilirubin: 0.6
Total Bilirubin: 0.7
Total Protein: 6

## 2010-12-31 LAB — HEMOGLOBIN A1C
Hgb A1c MFr Bld: 5.4
Mean Plasma Glucose: 115

## 2010-12-31 LAB — PROTIME-INR
INR: 1
Prothrombin Time: 13.3

## 2010-12-31 LAB — CBC
HCT: 47.7
Hemoglobin: 15.9
MCHC: 33.2
MCV: 95.6
Platelets: 214
RBC: 4.99
RDW: 13.3
WBC: 11.3 — ABNORMAL HIGH

## 2010-12-31 LAB — BASIC METABOLIC PANEL
BUN: 7
CO2: 26
Calcium: 8.7
Chloride: 106
Creatinine, Ser: 0.96
GFR calc Af Amer: >60
GFR calc non Af Amer: >60
Glucose, Bld: 90
Potassium: 3.9
Sodium: 137

## 2010-12-31 LAB — APTT: aPTT: 27

## 2010-12-31 LAB — LIPASE, BLOOD: Lipase: 21

## 2010-12-31 LAB — TROPONIN I
Troponin I: 0.03
Troponin I: 0.04

## 2010-12-31 LAB — TSH: TSH: 0.757

## 2010-12-31 LAB — AMYLASE: Amylase: 73

## 2011-01-06 LAB — DIFFERENTIAL
Basophils Absolute: 0
Basophils Absolute: 0
Basophils Relative: 0
Basophils Relative: 0
Eosinophils Absolute: 0.1
Eosinophils Absolute: 0.1
Eosinophils Relative: 1
Eosinophils Relative: 2
Lymphocytes Relative: 23
Lymphocytes Relative: 24
Lymphs Abs: 2.1
Lymphs Abs: 2.3
Monocytes Absolute: 0.5
Monocytes Absolute: 0.7
Monocytes Relative: 6
Monocytes Relative: 7
Neutro Abs: 5.8
Neutro Abs: 6.8
Neutrophils Relative %: 68
Neutrophils Relative %: 68

## 2011-01-06 LAB — CBC
HCT: 43
HCT: 47.1
Hemoglobin: 14.4
Hemoglobin: 16.2
MCHC: 33.3
MCHC: 34.4
MCV: 91.6
MCV: 93.6
Platelets: 172
Platelets: 208
RBC: 4.6
RBC: 5.14
RDW: 14.1 — ABNORMAL HIGH
RDW: 14.4 — ABNORMAL HIGH
WBC: 8.6
WBC: 9.9

## 2011-01-06 LAB — HEPATIC FUNCTION PANEL
ALT: 30
AST: 27
Albumin: 3.1 — ABNORMAL LOW
Alkaline Phosphatase: 59
Bilirubin, Direct: 0.1
Indirect Bilirubin: 0.5
Total Bilirubin: 0.6
Total Protein: 4.7 — ABNORMAL LOW

## 2011-01-06 LAB — COMPREHENSIVE METABOLIC PANEL
ALT: 19
AST: 17
Albumin: 3.7
Alkaline Phosphatase: 78
BUN: 5 — ABNORMAL LOW
CO2: 26
Calcium: 8.6
Chloride: 107
Creatinine, Ser: 0.82
GFR calc Af Amer: >60
GFR calc non Af Amer: >60
Glucose, Bld: 87
Potassium: 3.7
Sodium: 140
Total Bilirubin: 0.8
Total Protein: 6

## 2011-01-06 LAB — BASIC METABOLIC PANEL
BUN: 3 — ABNORMAL LOW
CO2: 27
Calcium: 8.4
Chloride: 108
Creatinine, Ser: 0.79
GFR calc Af Amer: >60
GFR calc non Af Amer: >60
Glucose, Bld: 113 — ABNORMAL HIGH
Potassium: 4
Sodium: 140

## 2011-01-06 LAB — AMYLASE: Amylase: 60

## 2011-01-06 LAB — LIPASE, BLOOD: Lipase: 27

## 2011-01-07 LAB — DIFFERENTIAL
Basophils Absolute: 0
Basophils Relative: 0
Eosinophils Absolute: 0.1
Eosinophils Relative: 1
Lymphocytes Relative: 34
Lymphs Abs: 3.6 — ABNORMAL HIGH
Monocytes Absolute: 0.6
Monocytes Relative: 6
Neutro Abs: 6.3
Neutrophils Relative %: 59

## 2011-01-07 LAB — URINALYSIS, ROUTINE W REFLEX MICROSCOPIC
Bilirubin Urine: NEGATIVE
Glucose, UA: NEGATIVE
Hgb urine dipstick: NEGATIVE
Ketones, ur: NEGATIVE
Nitrite: NEGATIVE
Protein, ur: NEGATIVE
Specific Gravity, Urine: 1.01
Urobilinogen, UA: 1
pH: 7

## 2011-01-07 LAB — BASIC METABOLIC PANEL WITH GFR
BUN: 8
CO2: 26
Calcium: 8.8
Chloride: 108
Creatinine, Ser: 0.86
GFR calc non Af Amer: >60
Glucose, Bld: 111 — ABNORMAL HIGH
Potassium: 4.2
Sodium: 139

## 2011-01-07 LAB — CBC
HCT: 45.4
Hemoglobin: 15.4
MCHC: 33.8
MCV: 92.3
Platelets: 213
RBC: 4.92
RDW: 14.3 — ABNORMAL HIGH
WBC: 10.7 — ABNORMAL HIGH

## 2011-01-07 LAB — POCT CARDIAC MARKERS
CKMB, poc: 2.3
Myoglobin, poc: 69
Operator id: 132501
Troponin i, poc: <0.05

## 2011-01-18 ENCOUNTER — Ambulatory Visit (INDEPENDENT_AMBULATORY_CARE_PROVIDER_SITE_OTHER): Payer: PRIVATE HEALTH INSURANCE | Admitting: Cardiovascular Disease

## 2011-01-18 ENCOUNTER — Encounter: Payer: Self-pay | Admitting: Cardiovascular Disease

## 2011-01-18 DIAGNOSIS — Z79899 Other long term (current) drug therapy: Secondary | ICD-10-CM

## 2011-01-18 DIAGNOSIS — R591 Generalized enlarged lymph nodes: Secondary | ICD-10-CM

## 2011-01-18 DIAGNOSIS — F17200 Nicotine dependence, unspecified, uncomplicated: Secondary | ICD-10-CM

## 2011-01-18 DIAGNOSIS — Z720 Tobacco use: Secondary | ICD-10-CM

## 2011-01-18 DIAGNOSIS — I2510 Atherosclerotic heart disease of native coronary artery without angina pectoris: Secondary | ICD-10-CM | POA: Insufficient documentation

## 2011-01-18 DIAGNOSIS — R0602 Shortness of breath: Secondary | ICD-10-CM

## 2011-01-18 DIAGNOSIS — E785 Hyperlipidemia, unspecified: Secondary | ICD-10-CM

## 2011-01-18 DIAGNOSIS — R599 Enlarged lymph nodes, unspecified: Secondary | ICD-10-CM

## 2011-01-18 MED ORDER — ATORVASTATIN CALCIUM 40 MG PO TABS: 40.0000 mg | ORAL_TABLET | Freq: Every day | ORAL | Status: DC

## 2011-01-18 NOTE — Assessment & Plan Note (Signed)
Continue medical therapy. He is currently not having any chest pain. I discussed with him the importance of lifestyle changes and especially smoking cessation. He is having headache likely do to isosorbide. This will be stopped today. Continue other medications.

## 2011-01-18 NOTE — Patient Instructions (Signed)
Your physician wants you to follow-up in: 6 months. You will receive a reminder letter in the mail one-two months in advance. If you don't receive a letter, please call our office to schedule the follow-up appointment. Stop Imdur (isosorbide) Your physician recommends that you go to the [facility] for a FASTING lipid profile and liver function labs. Do not eat or drink after midnight.  If the results of your test are normal or stable, you will receive a letter. If they are abnormal, the nurse will contact you by phone.

## 2011-01-18 NOTE — Assessment & Plan Note (Signed)
The patient was noted to have thoracic lymphadenopathy and a followup CT scan was recommended. Will request this especially that he has a prolonged history of smoking.
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
## 2011-01-18 NOTE — Assessment & Plan Note (Signed)
Discussed with the patient the importance of smoking cessation.

## 2011-01-18 NOTE — Assessment & Plan Note (Signed)
He is now on atorvastatin 40 mg once daily. Will request a fasting lipid and liver profile. Goal LDL should be less than 70.

## 2011-01-20 ENCOUNTER — Telehealth: Payer: Self-pay | Admitting: *Deleted

## 2011-01-20 DIAGNOSIS — Z79899 Other long term (current) drug therapy: Secondary | ICD-10-CM

## 2011-01-20 DIAGNOSIS — E785 Hyperlipidemia, unspecified: Secondary | ICD-10-CM

## 2011-01-20 NOTE — Telephone Encounter (Signed)
Pt notified of results and verbalized understanding. Pt is compliant with Lipitor. Dr. Kirke Corin will be notified.

## 2011-01-20 NOTE — Telephone Encounter (Signed)
Message copied by Arlyss Gandy on Thu Jan 20, 2011  4:15 PM ------      Message from: Lorine Bears A      Created: Thu Jan 20, 2011  3:19 PM       His cholesterol is terrible. Has he really been taking Lipitor regularly. I doubt that. Please check with him.

## 2011-01-20 NOTE — Telephone Encounter (Signed)
Left message to call back on voicemail regarding lab results. 

## 2011-01-21 ENCOUNTER — Other Ambulatory Visit: Payer: Self-pay | Admitting: Cardiovascular Disease

## 2011-01-21 ENCOUNTER — Telehealth: Payer: Self-pay | Admitting: *Deleted

## 2011-01-21 DIAGNOSIS — R599 Enlarged lymph nodes, unspecified: Secondary | ICD-10-CM

## 2011-01-21 NOTE — Telephone Encounter (Signed)
Auth # [id number]  Exp 03/07/11

## 2011-01-21 NOTE — Telephone Encounter (Signed)
CT CHEST w contrast set for 01-27-2011 @ [facility] Checking percert

## 2011-01-21 NOTE — Telephone Encounter (Signed)
CT Chest w Contrast has been scheduled for Thursday, January 27, 2011 @ [facility]. Tried Mr.Carnero telephone # with no answer.  Copy of order was mailed to his home address today.

## 2011-01-21 NOTE — Telephone Encounter (Signed)
Michael Chambers called the office and I verified with him the date and time of his CT Chest.

## 2011-01-26 MED ORDER — ATORVASTATIN CALCIUM 80 MG PO TABS: 80.0000 mg | ORAL_TABLET | Freq: Every day | ORAL | Status: DC

## 2011-01-26 NOTE — Telephone Encounter (Signed)
Per Dr. Kirke Corin: Increase Lipitor to 80 mg daily. Ask him to follow a healthy diet. Repeat fasting lipid and liver profile in 6-8 weeks  Pt notified and verbalized understanding.

## 2011-04-01 ENCOUNTER — Encounter: Payer: Self-pay | Admitting: *Deleted

## 2011-04-06 ENCOUNTER — Telehealth: Payer: Self-pay | Admitting: *Deleted

## 2011-04-06 ENCOUNTER — Other Ambulatory Visit: Payer: Self-pay | Admitting: Cardiovascular Disease

## 2011-04-06 DIAGNOSIS — R599 Enlarged lymph nodes, unspecified: Secondary | ICD-10-CM

## 2011-04-06 DIAGNOSIS — R0602 Shortness of breath: Secondary | ICD-10-CM

## 2011-04-06 NOTE — Telephone Encounter (Signed)
CT OF CHEST without contrast scheduled for 04-07-2011 @ [facility] Checking percert

## 2011-04-06 NOTE — Telephone Encounter (Signed)
Auth # [id number] exp 05/21/11

## 2011-04-12 ENCOUNTER — Telehealth: Payer: Self-pay | Admitting: *Deleted

## 2011-04-12 NOTE — Telephone Encounter (Signed)
Pt notified of results and verbalized understanding.  Pt also notified to have labs done for FLP/LFT. Pt verbalized understanding.

## 2011-04-12 NOTE — Telephone Encounter (Signed)
Message copied by Arlyss Gandy on Tue Apr 12, 2011 11:35 AM ------      Message from: Lorine Bears A      Created: Fri Apr 08, 2011 11:02 AM       Inform patient that CT scan of chest was ok. No masses. The lymph nodes are unchanged.

## 2011-04-26 ENCOUNTER — Telehealth: Payer: Self-pay | Admitting: *Deleted

## 2011-04-26 DIAGNOSIS — E785 Hyperlipidemia, unspecified: Secondary | ICD-10-CM

## 2011-04-26 DIAGNOSIS — Z79899 Other long term (current) drug therapy: Secondary | ICD-10-CM

## 2011-04-26 NOTE — Telephone Encounter (Signed)
Message copied by Arlyss Gandy on Tue Apr 26, 2011  4:17 PM ------      Message from: Rande Brunt      Created: Tue Apr 26, 2011 11:49 AM       LDL currently 119, down from 141 in Oct. Is pt on Lipitor 40 or 80 mg daily? Dr Jari Sportsman office note indicated 40. Please verify. If only taking 40, then increase to 80 mg daily. If already taking 80 daily, then continue. In either case, plan will be to repeat a FLP/LFT profile in 12 weeks from now, to reassess.

## 2011-04-26 NOTE — Telephone Encounter (Signed)
Left message to call back on voicemail regarding results.  

## 2011-05-03 NOTE — Telephone Encounter (Signed)
Left message on sister's voicemail asking for return call.

## 2011-05-03 NOTE — Telephone Encounter (Signed)
Notes Recorded by Gene Serpe, PA on 04/28/2011 at 1:21 PM Suggest starting Crestor 20 mg daily, then check FLP/LFTs in 12 weeks. Notes Recorded by Hoover Brunette, LPN on 04/27/8655 at 9:17 AM Patient walked into office. Notified of below. Stated he had to stop that medicine due to nausea & vomiting. Stopped about 1 month ago. Will call sister Gerrit Heck) [phone] with any further instructions as he does not have anymore minutes on his cell phone.

## 2011-05-10 NOTE — Telephone Encounter (Signed)
Left message on sister's voicemail for pt to call back regarding results.

## 2011-05-12 MED ORDER — ROSUVASTATIN CALCIUM 20 MG PO TABS: 20.0000 mg | ORAL_TABLET | Freq: Every day | ORAL | Status: DC

## 2011-05-12 NOTE — Telephone Encounter (Signed)
Pt's sister notified and verbalized understanding.

## 2011-11-21 DIAGNOSIS — R079 Chest pain, unspecified: Secondary | ICD-10-CM

## 2015-07-02 DIAGNOSIS — E785 Hyperlipidemia, unspecified: Secondary | ICD-10-CM | POA: Diagnosis not present

## 2015-07-02 DIAGNOSIS — F329 Major depressive disorder, single episode, unspecified: Secondary | ICD-10-CM | POA: Diagnosis not present

## 2015-07-02 DIAGNOSIS — E559 Vitamin D deficiency, unspecified: Secondary | ICD-10-CM | POA: Diagnosis not present

## 2015-07-02 DIAGNOSIS — Z720 Tobacco use: Secondary | ICD-10-CM | POA: Diagnosis not present

## 2015-07-02 DIAGNOSIS — R739 Hyperglycemia, unspecified: Secondary | ICD-10-CM | POA: Diagnosis not present

## 2015-07-02 DIAGNOSIS — N400 Enlarged prostate without lower urinary tract symptoms: Secondary | ICD-10-CM | POA: Diagnosis not present

## 2015-08-07 ENCOUNTER — Emergency Department (HOSPITAL_COMMUNITY)
Admission: EM | Admit: 2015-08-07 | Discharge: 2015-08-07 | Disposition: A | Discharge status: Home / Self Care | Payer: Medicare Other | Attending: Emergency Medicine | Admitting: Emergency Medicine

## 2015-08-07 ENCOUNTER — Emergency Department (HOSPITAL_COMMUNITY): Payer: Medicare Other

## 2015-08-07 ENCOUNTER — Encounter (HOSPITAL_COMMUNITY): Payer: Self-pay | Admitting: Emergency Medicine

## 2015-08-07 DIAGNOSIS — E785 Hyperlipidemia, unspecified: Secondary | ICD-10-CM | POA: Insufficient documentation

## 2015-08-07 DIAGNOSIS — I2510 Atherosclerotic heart disease of native coronary artery without angina pectoris: Secondary | ICD-10-CM | POA: Insufficient documentation

## 2015-08-07 DIAGNOSIS — M6281 Muscle weakness (generalized): Secondary | ICD-10-CM | POA: Diagnosis not present

## 2015-08-07 DIAGNOSIS — F329 Major depressive disorder, single episode, unspecified: Secondary | ICD-10-CM | POA: Insufficient documentation

## 2015-08-07 DIAGNOSIS — J449 Chronic obstructive pulmonary disease, unspecified: Secondary | ICD-10-CM | POA: Diagnosis not present

## 2015-08-07 DIAGNOSIS — F17210 Nicotine dependence, cigarettes, uncomplicated: Secondary | ICD-10-CM | POA: Diagnosis not present

## 2015-08-07 DIAGNOSIS — M545 Low back pain: Secondary | ICD-10-CM | POA: Diagnosis not present

## 2015-08-07 DIAGNOSIS — M7989 Other specified soft tissue disorders: Secondary | ICD-10-CM | POA: Diagnosis not present

## 2015-08-07 DIAGNOSIS — Z716 Tobacco abuse counseling: Secondary | ICD-10-CM | POA: Insufficient documentation

## 2015-08-07 DIAGNOSIS — M5432 Sciatica, left side: Secondary | ICD-10-CM | POA: Diagnosis not present

## 2015-08-07 DIAGNOSIS — I10 Essential (primary) hypertension: Secondary | ICD-10-CM | POA: Insufficient documentation

## 2015-08-07 DIAGNOSIS — R29898 Other symptoms and signs involving the musculoskeletal system: Secondary | ICD-10-CM

## 2015-08-07 DIAGNOSIS — R531 Weakness: Secondary | ICD-10-CM | POA: Diagnosis not present

## 2015-08-07 DIAGNOSIS — Z79899 Other long term (current) drug therapy: Secondary | ICD-10-CM | POA: Diagnosis not present

## 2015-08-07 DIAGNOSIS — M5442 Lumbago with sciatica, left side: Secondary | ICD-10-CM | POA: Diagnosis not present

## 2015-08-07 DIAGNOSIS — M79605 Pain in left leg: Secondary | ICD-10-CM | POA: Diagnosis not present

## 2015-08-07 LAB — CBC WITH DIFFERENTIAL/PLATELET
Basophils Absolute: 0 10*3/uL (ref 0.0–0.1)
Basophils Relative: 0 %
Eosinophils Absolute: 0.3 10*3/uL (ref 0.0–0.7)
Eosinophils Relative: 3 %
HCT: 45.7 % (ref 39.0–52.0)
Hemoglobin: 15.2 g/dL (ref 13.0–17.0)
Lymphocytes Relative: 28 %
Lymphs Abs: 2.5 10*3/uL (ref 0.7–4.0)
MCH: 31.5 pg (ref 26.0–34.0)
MCHC: 33.3 g/dL (ref 30.0–36.0)
MCV: 94.8 fL (ref 78.0–100.0)
Monocytes Absolute: 0.9 10*3/uL (ref 0.1–1.0)
Monocytes Relative: 10 %
Neutro Abs: 5.4 10*3/uL (ref 1.7–7.7)
Neutrophils Relative %: 59 %
Platelets: 225 10*3/uL (ref 150–400)
RBC: 4.82 MIL/uL (ref 4.22–5.81)
RDW: 13.9 % (ref 11.5–15.5)
WBC: 9.1 10*3/uL (ref 4.0–10.5)

## 2015-08-07 LAB — URINALYSIS, ROUTINE W REFLEX MICROSCOPIC
Bilirubin Urine: NEGATIVE
Glucose, UA: NEGATIVE mg/dL
Hgb urine dipstick: NEGATIVE
Ketones, ur: NEGATIVE mg/dL
Leukocytes, UA: NEGATIVE
Nitrite: NEGATIVE
Protein, ur: NEGATIVE mg/dL
Specific Gravity, Urine: 1.025 (ref 1.005–1.030)
pH: 5.5 (ref 5.0–8.0)

## 2015-08-07 LAB — BASIC METABOLIC PANEL
Anion gap: 6 (ref 5–15)
BUN: 22 mg/dL — ABNORMAL HIGH (ref 6–20)
CO2: 22 mmol/L (ref 22–32)
Calcium: 8.4 mg/dL — ABNORMAL LOW (ref 8.9–10.3)
Chloride: 107 mmol/L (ref 101–111)
Creatinine, Ser: 1.03 mg/dL (ref 0.61–1.24)
GFR calc Af Amer: >60 mL/min (ref >60)
GFR calc non Af Amer: >60 mL/min (ref >60)
Glucose, Bld: 108 mg/dL — ABNORMAL HIGH (ref 65–99)
Potassium: 4.2 mmol/L (ref 3.5–5.1)
Sodium: 135 mmol/L (ref 135–145)

## 2015-08-07 MED ORDER — METHYLPREDNISOLONE 4 MG PO TBPK: ORAL_TABLET | ORAL | Status: DC

## 2015-08-07 MED ORDER — HYDROCODONE-ACETAMINOPHEN 5-325 MG PO TABS
1.0000 | ORAL_TABLET | Freq: Once | ORAL | Status: AC
  Administered 2015-08-07: 1 via ORAL
  Filled 2015-08-07: qty 1

## 2015-08-07 MED ORDER — IBUPROFEN 400 MG PO TABS: 400.0000 mg | ORAL_TABLET | Freq: Three times a day (TID) | ORAL | Status: DC

## 2015-08-07 NOTE — ED Notes (Signed)
Unable to locate handheld doppler. Dr Wyvonnia Dusky notified

## 2015-08-07 NOTE — ED Notes (Signed)
Pt states his left leg has been hurting and swelling for about 3 days.

## 2015-08-07 NOTE — ED Notes (Signed)
Patient walked to bathroom. Patient said he feels well. No complaints or concerns.

## 2015-08-07 NOTE — Discharge Instructions (Signed)
Sciatica There is no evidence of stroke or blood clot in the leg. Follow up with the neurosurgeon regarding the pinched nerve in your back. Return to the ED if you develop new or worsening symptoms. Sciatica is pain, weakness, numbness, or tingling along the path of the sciatic nerve. The nerve starts in the lower back and runs down the back of each leg. The nerve controls the muscles in the lower leg and in the back of the knee, while also providing sensation to the back of the thigh, lower leg, and the sole of your foot. Sciatica is a symptom of another medical condition. For instance, nerve damage or certain conditions, such as a herniated disk or bone spur on the spine, pinch or put pressure on the sciatic nerve. This causes the pain, weakness, or other sensations normally associated with sciatica. Generally, sciatica only affects one side of the body. CAUSES   Herniated or slipped disc.  Degenerative disk disease.  A pain disorder involving the narrow muscle in the buttocks (piriformis syndrome).  Pelvic injury or fracture.  Pregnancy.  Tumor (rare). SYMPTOMS  Symptoms can vary from mild to very severe. The symptoms usually travel from the low back to the buttocks and down the back of the leg. Symptoms can include:  Mild tingling or dull aches in the lower back, leg, or hip.  Numbness in the back of the calf or sole of the foot.  Burning sensations in the lower back, leg, or hip.  Sharp pains in the lower back, leg, or hip.  Leg weakness.  Severe back pain inhibiting movement. These symptoms may get worse with coughing, sneezing, laughing, or prolonged sitting or standing. Also, being overweight may worsen symptoms. DIAGNOSIS  Your caregiver will perform a physical exam to look for common symptoms of sciatica. He or she may ask you to do certain movements or activities that would trigger sciatic nerve pain. Other tests may be performed to find the cause of the sciatica. These may  include:  Blood tests.  X-rays.  Imaging tests, such as an MRI or CT scan. TREATMENT  Treatment is directed at the cause of the sciatic pain. Sometimes, treatment is not necessary and the pain and discomfort goes away on its own. If treatment is needed, your caregiver may suggest:  Over-the-counter medicines to relieve pain.  Prescription medicines, such as anti-inflammatory medicine, muscle relaxants, or narcotics.  Applying heat or ice to the painful area.  Steroid injections to lessen pain, irritation, and inflammation around the nerve.  Reducing activity during periods of pain.  Exercising and stretching to strengthen your abdomen and improve flexibility of your spine. Your caregiver may suggest losing weight if the extra weight makes the back pain worse.  Physical therapy.  Surgery to eliminate what is pressing or pinching the nerve, such as a bone spur or part of a herniated disk. HOME CARE INSTRUCTIONS   Only take over-the-counter or prescription medicines for pain or discomfort as directed by your caregiver.  Apply ice to the affected area for 20 minutes, 3-4 times a day for the first 48-72 hours. Then try heat in the same way.  Exercise, stretch, or perform your usual activities if these do not aggravate your pain.  Attend physical therapy sessions as directed by your caregiver.  Keep all follow-up appointments as directed by your caregiver.  Do not wear high heels or shoes that do not provide proper support.  Check your mattress to see if it is too soft. A  firm mattress may lessen your pain and discomfort. SEEK IMMEDIATE MEDICAL CARE IF:   You lose control of your bowel or bladder (incontinence).  You have increasing weakness in the lower back, pelvis, buttocks, or legs.  You have redness or swelling of your back.  You have a burning sensation when you urinate.  You have pain that gets worse when you lie down or awakens you at night.  Your pain is worse  than you have experienced in the past.  Your pain is lasting longer than 4 weeks.  You are suddenly losing weight without reason. MAKE SURE YOU:  Understand these instructions.  Will watch your condition.  Will get help right away if you are not doing well or get worse.   This information is not intended to replace advice given to you by your health care provider. Make sure you discuss any questions you have with your health care provider.   Document Released: 03/08/2001 Document Revised: 12/03/2014 Document Reviewed: 07/24/2011 Elsevier Interactive Patient Education [employer].
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
## 2015-08-11 DIAGNOSIS — M5126 Other intervertebral disc displacement, lumbar region: Secondary | ICD-10-CM | POA: Diagnosis not present

## 2015-09-08 ENCOUNTER — Emergency Department (HOSPITAL_COMMUNITY)
Admission: EM | Admit: 2015-09-08 | Discharge: 2015-09-08 | Disposition: A | Discharge status: Home / Self Care | Payer: Medicare Other | Attending: Emergency Medicine | Admitting: Emergency Medicine

## 2015-09-08 ENCOUNTER — Emergency Department (HOSPITAL_COMMUNITY): Payer: Medicare Other

## 2015-09-08 ENCOUNTER — Encounter (HOSPITAL_COMMUNITY): Payer: Self-pay | Admitting: Emergency Medicine

## 2015-09-08 DIAGNOSIS — E785 Hyperlipidemia, unspecified: Secondary | ICD-10-CM | POA: Diagnosis not present

## 2015-09-08 DIAGNOSIS — I2510 Atherosclerotic heart disease of native coronary artery without angina pectoris: Secondary | ICD-10-CM | POA: Insufficient documentation

## 2015-09-08 DIAGNOSIS — I10 Essential (primary) hypertension: Secondary | ICD-10-CM | POA: Diagnosis not present

## 2015-09-08 DIAGNOSIS — R42 Dizziness and giddiness: Secondary | ICD-10-CM | POA: Diagnosis not present

## 2015-09-08 DIAGNOSIS — R55 Syncope and collapse: Secondary | ICD-10-CM | POA: Diagnosis present

## 2015-09-08 DIAGNOSIS — J449 Chronic obstructive pulmonary disease, unspecified: Secondary | ICD-10-CM | POA: Diagnosis not present

## 2015-09-08 DIAGNOSIS — F329 Major depressive disorder, single episode, unspecified: Secondary | ICD-10-CM | POA: Insufficient documentation

## 2015-09-08 DIAGNOSIS — T670XXA Heatstroke and sunstroke, initial encounter: Secondary | ICD-10-CM | POA: Insufficient documentation

## 2015-09-08 DIAGNOSIS — Z79899 Other long term (current) drug therapy: Secondary | ICD-10-CM | POA: Insufficient documentation

## 2015-09-08 DIAGNOSIS — I9589 Other hypotension: Secondary | ICD-10-CM | POA: Insufficient documentation

## 2015-09-08 DIAGNOSIS — F17210 Nicotine dependence, cigarettes, uncomplicated: Secondary | ICD-10-CM | POA: Diagnosis not present

## 2015-09-08 DIAGNOSIS — R404 Transient alteration of awareness: Secondary | ICD-10-CM | POA: Diagnosis not present

## 2015-09-08 DIAGNOSIS — I959 Hypotension, unspecified: Secondary | ICD-10-CM | POA: Diagnosis not present

## 2015-09-08 DIAGNOSIS — T6701XA Heatstroke and sunstroke, initial encounter: Secondary | ICD-10-CM

## 2015-09-08 LAB — BASIC METABOLIC PANEL
Anion gap: 5 (ref 5–15)
BUN: 18 mg/dL (ref 6–20)
CO2: 23 mmol/L (ref 22–32)
Calcium: 8.5 mg/dL — ABNORMAL LOW (ref 8.9–10.3)
Chloride: 109 mmol/L (ref 101–111)
Creatinine, Ser: 0.99 mg/dL (ref 0.61–1.24)
GFR calc Af Amer: >60 mL/min (ref >60)
GFR calc non Af Amer: >60 mL/min (ref >60)
Glucose, Bld: 109 mg/dL — ABNORMAL HIGH (ref 65–99)
Potassium: 3.8 mmol/L (ref 3.5–5.1)
Sodium: 137 mmol/L (ref 135–145)

## 2015-09-08 LAB — URINALYSIS, ROUTINE W REFLEX MICROSCOPIC
Bilirubin Urine: NEGATIVE
Glucose, UA: NEGATIVE mg/dL
Ketones, ur: NEGATIVE mg/dL
Leukocytes, UA: NEGATIVE
Nitrite: NEGATIVE
Protein, ur: NEGATIVE mg/dL
Specific Gravity, Urine: 1.02 (ref 1.005–1.030)
pH: 5.5 (ref 5.0–8.0)

## 2015-09-08 LAB — CBC
HCT: 42.8 % (ref 39.0–52.0)
Hemoglobin: 14.3 g/dL (ref 13.0–17.0)
MCH: 31.7 pg (ref 26.0–34.0)
MCHC: 33.4 g/dL (ref 30.0–36.0)
MCV: 94.9 fL (ref 78.0–100.0)
Platelets: 222 10*3/uL (ref 150–400)
RBC: 4.51 MIL/uL (ref 4.22–5.81)
RDW: 13.8 % (ref 11.5–15.5)
WBC: 10.3 10*3/uL (ref 4.0–10.5)

## 2015-09-08 LAB — I-STAT TROPONIN, ED: Troponin i, poc: 0.01 ng/mL (ref 0.00–0.08)

## 2015-09-08 LAB — URINE MICROSCOPIC-ADD ON

## 2015-09-08 LAB — I-STAT CG4 LACTIC ACID, ED: Lactic Acid, Venous: 1.17 mmol/L (ref 0.5–2.0)

## 2015-09-08 LAB — TROPONIN I: Troponin I: <0.03 ng/mL (ref <0.031)

## 2015-09-08 LAB — CBG MONITORING, ED: Glucose-Capillary: 104 mg/dL — ABNORMAL HIGH (ref 65–99)

## 2015-09-08 MED ORDER — SODIUM CHLORIDE 0.9 % IV BOLUS (SEPSIS)
1000.0000 mL | Freq: Once | INTRAVENOUS | Status: AC
  Administered 2015-09-08: 1000 mL via INTRAVENOUS

## 2015-09-08 NOTE — ED Notes (Signed)
Ambulated Pt walked around room fine did ok standing on his own
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
## 2015-09-08 NOTE — ED Notes (Signed)
Patient stood at bedside to use urinal with this nurse at his side. Patient denies dizziness or any other symptoms. Patient steady upon standing.

## 2015-09-08 NOTE — ED Notes (Signed)
Patient's family member called inquiring about patient's status. Patient states family member is to be called when ready to go home to transport him home. Name is Eustaquio Maize, number is [phone].

## 2015-09-08 NOTE — Discharge Instructions (Signed)
Stay well hydrated with water. Return to the emergency department if you develop chest pain, shortness of breath, you pass out, fevers or other concerns. Follow up for ultrasound of your heart with your primary doctor.   If you were given medicines take as directed.  If you are on coumadin or contraceptives realize their levels and effectiveness is altered by many different medicines.  If you have any reaction (rash, tongues swelling, other) to the medicines stop taking and see a physician.    If your blood pressure was elevated in the ER make sure you follow up for management with a primary doctor or return for chest pain, shortness of breath or stroke symptoms.  Please follow up as directed and return to the ER or see a physician for new or worsening symptoms.  Thank you. Filed Vitals:   09/08/15 1330 09/08/15 1400 09/08/15 1415 09/08/15 1430  BP: 129/84 150/86 147/83 134/83  Pulse: 83 82 85 84  Temp:      TempSrc:      Resp: 14 19 21 16   Height:      Weight:      SpO2: 98% 98% 95% 96%

## 2015-09-08 NOTE — ED Notes (Signed)
Per EMS: Pt found collapsed in his garden, disoriented on ems arrival, pt has only had coffee to drink this morning.  Pt diaphoretic on arrival, alert and oriented.   166/92, 102hr, 96%,

## 2015-10-26 DIAGNOSIS — F17210 Nicotine dependence, cigarettes, uncomplicated: Secondary | ICD-10-CM | POA: Diagnosis not present

## 2015-10-26 DIAGNOSIS — Z6838 Body mass index (BMI) 38.0-38.9, adult: Secondary | ICD-10-CM | POA: Diagnosis not present

## 2015-10-26 DIAGNOSIS — I10 Essential (primary) hypertension: Secondary | ICD-10-CM | POA: Diagnosis not present

## 2015-10-26 DIAGNOSIS — F329 Major depressive disorder, single episode, unspecified: Secondary | ICD-10-CM | POA: Diagnosis not present

## 2015-10-26 DIAGNOSIS — Z299 Encounter for prophylactic measures, unspecified: Secondary | ICD-10-CM | POA: Diagnosis not present

## 2015-10-26 DIAGNOSIS — Z720 Tobacco use: Secondary | ICD-10-CM | POA: Diagnosis not present

## 2015-10-26 DIAGNOSIS — G459 Transient cerebral ischemic attack, unspecified: Secondary | ICD-10-CM | POA: Diagnosis not present

## 2015-10-26 DIAGNOSIS — M549 Dorsalgia, unspecified: Secondary | ICD-10-CM | POA: Diagnosis not present

## 2015-11-23 DIAGNOSIS — M5126 Other intervertebral disc displacement, lumbar region: Secondary | ICD-10-CM | POA: Diagnosis not present

## 2015-11-26 ENCOUNTER — Other Ambulatory Visit: Payer: Self-pay | Admitting: Neurosurgery

## 2015-11-26 DIAGNOSIS — M5126 Other intervertebral disc displacement, lumbar region: Secondary | ICD-10-CM

## 2015-12-11 ENCOUNTER — Ambulatory Visit
Admission: RE | Admit: 2015-12-11 | Discharge: 2015-12-11 | Disposition: A | Discharge status: Home / Self Care | Payer: Medicare Other | Source: Ambulatory Visit | Attending: Neurosurgery | Admitting: Neurosurgery

## 2015-12-11 DIAGNOSIS — M5126 Other intervertebral disc displacement, lumbar region: Secondary | ICD-10-CM

## 2015-12-11 DIAGNOSIS — M545 Low back pain: Secondary | ICD-10-CM | POA: Diagnosis not present

## 2015-12-11 MED ORDER — METHYLPREDNISOLONE ACETATE 40 MG/ML INJ SUSP (RADIOLOG
120.0000 mg | Freq: Once | INTRAMUSCULAR | Status: AC
  Administered 2015-12-11: 120 mg via EPIDURAL

## 2015-12-11 MED ORDER — IOPAMIDOL (ISOVUE-M 200) INJECTION 41%
1.0000 mL | Freq: Once | INTRAMUSCULAR | Status: AC
  Administered 2015-12-11: 1 mL via EPIDURAL

## 2015-12-11 NOTE — Discharge Instructions (Signed)

## 2015-12-25 DIAGNOSIS — Z1211 Encounter for screening for malignant neoplasm of colon: Secondary | ICD-10-CM | POA: Diagnosis not present

## 2015-12-25 DIAGNOSIS — Z6837 Body mass index (BMI) 37.0-37.9, adult: Secondary | ICD-10-CM | POA: Diagnosis not present

## 2015-12-25 DIAGNOSIS — E7800 Pure hypercholesterolemia, unspecified: Secondary | ICD-10-CM | POA: Diagnosis not present

## 2015-12-25 DIAGNOSIS — Z7189 Other specified counseling: Secondary | ICD-10-CM | POA: Diagnosis not present

## 2015-12-25 DIAGNOSIS — Z125 Encounter for screening for malignant neoplasm of prostate: Secondary | ICD-10-CM | POA: Diagnosis not present

## 2015-12-25 DIAGNOSIS — R5383 Other fatigue: Secondary | ICD-10-CM | POA: Diagnosis not present

## 2015-12-25 DIAGNOSIS — Z79899 Other long term (current) drug therapy: Secondary | ICD-10-CM | POA: Diagnosis not present

## 2015-12-25 DIAGNOSIS — Z299 Encounter for prophylactic measures, unspecified: Secondary | ICD-10-CM | POA: Diagnosis not present

## 2015-12-25 DIAGNOSIS — Z0000 Encounter for general adult medical examination without abnormal findings: Secondary | ICD-10-CM | POA: Diagnosis not present

## 2015-12-25 DIAGNOSIS — Z1389 Encounter for screening for other disorder: Secondary | ICD-10-CM | POA: Diagnosis not present

## 2016-01-22 DIAGNOSIS — M5126 Other intervertebral disc displacement, lumbar region: Secondary | ICD-10-CM | POA: Diagnosis not present

## 2016-01-26 ENCOUNTER — Other Ambulatory Visit: Payer: Self-pay | Admitting: Nurse Practitioner

## 2016-01-26 DIAGNOSIS — M5126 Other intervertebral disc displacement, lumbar region: Secondary | ICD-10-CM

## 2016-02-05 DIAGNOSIS — Z6838 Body mass index (BMI) 38.0-38.9, adult: Secondary | ICD-10-CM | POA: Diagnosis not present

## 2016-02-05 DIAGNOSIS — F329 Major depressive disorder, single episode, unspecified: Secondary | ICD-10-CM | POA: Diagnosis not present

## 2016-02-05 DIAGNOSIS — Z299 Encounter for prophylactic measures, unspecified: Secondary | ICD-10-CM | POA: Diagnosis not present

## 2016-02-05 DIAGNOSIS — M7732 Calcaneal spur, left foot: Secondary | ICD-10-CM | POA: Diagnosis not present

## 2016-02-05 DIAGNOSIS — I10 Essential (primary) hypertension: Secondary | ICD-10-CM | POA: Diagnosis not present

## 2016-02-05 DIAGNOSIS — R195 Other fecal abnormalities: Secondary | ICD-10-CM | POA: Diagnosis not present

## 2016-02-12 ENCOUNTER — Other Ambulatory Visit: Payer: Self-pay | Admitting: Nurse Practitioner

## 2016-02-12 ENCOUNTER — Ambulatory Visit
Admission: RE | Admit: 2016-02-12 | Discharge: 2016-02-12 | Disposition: A | Discharge status: Home / Self Care | Payer: Medicare Other | Source: Ambulatory Visit | Attending: Nurse Practitioner | Admitting: Nurse Practitioner

## 2016-02-12 DIAGNOSIS — M5126 Other intervertebral disc displacement, lumbar region: Secondary | ICD-10-CM

## 2016-02-12 DIAGNOSIS — M545 Low back pain: Secondary | ICD-10-CM | POA: Diagnosis not present

## 2016-02-12 MED ORDER — METHYLPREDNISOLONE ACETATE 40 MG/ML INJ SUSP (RADIOLOG
120.0000 mg | Freq: Once | INTRAMUSCULAR | Status: AC
  Administered 2016-02-12: 120 mg via EPIDURAL

## 2016-02-12 MED ORDER — IOPAMIDOL (ISOVUE-M 200) INJECTION 41%
1.0000 mL | Freq: Once | INTRAMUSCULAR | Status: AC
  Administered 2016-02-12: 1 mL via EPIDURAL

## 2016-02-12 NOTE — Discharge Instructions (Signed)

## 2016-02-16 ENCOUNTER — Encounter (INDEPENDENT_AMBULATORY_CARE_PROVIDER_SITE_OTHER): Payer: Self-pay | Admitting: Internal Medicine

## 2016-02-29 ENCOUNTER — Encounter (INDEPENDENT_AMBULATORY_CARE_PROVIDER_SITE_OTHER): Payer: Self-pay | Admitting: Internal Medicine

## 2016-02-29 ENCOUNTER — Encounter (INDEPENDENT_AMBULATORY_CARE_PROVIDER_SITE_OTHER): Payer: Self-pay | Admitting: *Deleted

## 2016-02-29 ENCOUNTER — Other Ambulatory Visit (INDEPENDENT_AMBULATORY_CARE_PROVIDER_SITE_OTHER): Payer: Self-pay | Admitting: Internal Medicine

## 2016-02-29 ENCOUNTER — Ambulatory Visit (INDEPENDENT_AMBULATORY_CARE_PROVIDER_SITE_OTHER): Payer: Medicare Other | Admitting: Internal Medicine

## 2016-02-29 VITALS — BP 122/82 | HR 88 | Temp 98.7°F | Ht 69.0 in | Wt 246.9 lb

## 2016-02-29 DIAGNOSIS — R195 Other fecal abnormalities: Secondary | ICD-10-CM

## 2016-02-29 NOTE — Patient Instructions (Signed)
Colonoscopy.  The risks and benefits such as perforation, bleeding, and infection were reviewed with the patient and is agreeable. 
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
## 2016-03-01 ENCOUNTER — Encounter (INDEPENDENT_AMBULATORY_CARE_PROVIDER_SITE_OTHER): Payer: Self-pay | Admitting: *Deleted

## 2016-03-01 DIAGNOSIS — R195 Other fecal abnormalities: Secondary | ICD-10-CM | POA: Insufficient documentation

## 2016-03-04 DIAGNOSIS — M5126 Other intervertebral disc displacement, lumbar region: Secondary | ICD-10-CM | POA: Diagnosis not present

## 2016-03-11 ENCOUNTER — Encounter (INDEPENDENT_AMBULATORY_CARE_PROVIDER_SITE_OTHER): Payer: Self-pay

## 2016-03-14 ENCOUNTER — Encounter (HOSPITAL_COMMUNITY)
Admission: RE | Disposition: A | Discharge status: Home / Self Care | Payer: Self-pay | Source: Ambulatory Visit | Attending: Internal Medicine

## 2016-03-14 ENCOUNTER — Ambulatory Visit (HOSPITAL_COMMUNITY)
Admission: RE | Admit: 2016-03-14 | Discharge: 2016-03-14 | Disposition: A | Discharge status: Home / Self Care | Payer: Medicare Other | Source: Ambulatory Visit | Attending: Internal Medicine | Admitting: Internal Medicine

## 2016-03-14 ENCOUNTER — Encounter (HOSPITAL_COMMUNITY): Payer: Self-pay

## 2016-03-14 DIAGNOSIS — D125 Benign neoplasm of sigmoid colon: Secondary | ICD-10-CM | POA: Insufficient documentation

## 2016-03-14 DIAGNOSIS — D122 Benign neoplasm of ascending colon: Secondary | ICD-10-CM | POA: Insufficient documentation

## 2016-03-14 DIAGNOSIS — Z79899 Other long term (current) drug therapy: Secondary | ICD-10-CM | POA: Diagnosis not present

## 2016-03-14 DIAGNOSIS — M545 Low back pain: Secondary | ICD-10-CM | POA: Diagnosis not present

## 2016-03-14 DIAGNOSIS — R109 Unspecified abdominal pain: Secondary | ICD-10-CM | POA: Diagnosis not present

## 2016-03-14 DIAGNOSIS — I10 Essential (primary) hypertension: Secondary | ICD-10-CM | POA: Insufficient documentation

## 2016-03-14 DIAGNOSIS — R195 Other fecal abnormalities: Secondary | ICD-10-CM | POA: Insufficient documentation

## 2016-03-14 DIAGNOSIS — K5730 Diverticulosis of large intestine without perforation or abscess without bleeding: Secondary | ICD-10-CM | POA: Insufficient documentation

## 2016-03-14 DIAGNOSIS — K644 Residual hemorrhoidal skin tags: Secondary | ICD-10-CM | POA: Insufficient documentation

## 2016-03-14 DIAGNOSIS — F329 Major depressive disorder, single episode, unspecified: Secondary | ICD-10-CM | POA: Insufficient documentation

## 2016-03-14 DIAGNOSIS — G8929 Other chronic pain: Secondary | ICD-10-CM | POA: Diagnosis not present

## 2016-03-14 DIAGNOSIS — I2510 Atherosclerotic heart disease of native coronary artery without angina pectoris: Secondary | ICD-10-CM | POA: Insufficient documentation

## 2016-03-14 DIAGNOSIS — F17210 Nicotine dependence, cigarettes, uncomplicated: Secondary | ICD-10-CM | POA: Insufficient documentation

## 2016-03-14 HISTORY — PX: COLONOSCOPY: SHX5424

## 2016-03-14 HISTORY — PX: POLYPECTOMY: SHX5525

## 2016-03-14 SURGERY — COLONOSCOPY
Anesthesia: Moderate Sedation

## 2016-03-14 MED ORDER — MIDAZOLAM HCL 5 MG/5ML IJ SOLN
INTRAMUSCULAR | Status: AC
  Filled 2016-03-14: qty 10

## 2016-03-14 MED ORDER — SODIUM CHLORIDE 0.9 % IV SOLN
INTRAVENOUS | Status: DC
  Administered 2016-03-14: 1000 mL via INTRAVENOUS

## 2016-03-14 MED ORDER — MIDAZOLAM HCL 5 MG/5ML IJ SOLN
INTRAMUSCULAR | Status: DC | PRN
  Administered 2016-03-14 (×4): 2 mg via INTRAVENOUS

## 2016-03-14 MED ORDER — PROMETHAZINE HCL 25 MG/ML IJ SOLN
INTRAMUSCULAR | Status: AC
  Filled 2016-03-14: qty 1

## 2016-03-14 MED ORDER — MEPERIDINE HCL 50 MG/ML IJ SOLN
INTRAMUSCULAR | Status: AC
  Filled 2016-03-14: qty 1

## 2016-03-14 MED ORDER — STERILE WATER FOR IRRIGATION IR SOLN
Status: DC | PRN
  Administered 2016-03-14: 09:00:00

## 2016-03-14 MED ORDER — PROMETHAZINE HCL 25 MG/ML IJ SOLN
INTRAMUSCULAR | Status: DC | PRN
  Administered 2016-03-14 (×2): 12.5 mg via INTRAVENOUS

## 2016-03-14 MED ORDER — MEPERIDINE HCL 50 MG/ML IJ SOLN
INTRAMUSCULAR | Status: DC | PRN
Start: 2016-03-14 — End: 2016-03-14
  Administered 2016-03-14 (×3): 25 mg via INTRAVENOUS

## 2016-03-14 NOTE — Discharge Instructions (Signed)
No aspirin or NSAIDs for 24 hours. Resume other medications and diet as before. No driving for 24 hours. Physician will call with biopsy results.   Colonoscopy, Adult, Care After This sheet gives you information about how to care for yourself after your procedure. Your doctor may also give you more specific instructions. If you have problems or questions, call your doctor. Follow these instructions at home: General instructions  For the first 24 hours after the procedure:  Do not drive or use machinery.  Do not sign important documents.  Do not drink alcohol.  Do your daily activities more slowly than normal.  Eat foods that are soft and easy to digest.  Rest often.  Take over-the-counter or prescription medicines only as told by your doctor.  It is up to you to get the results of your procedure. Ask your doctor, or the department performing the procedure, when your results will be ready. To help cramping and bloating:  Try walking around.  Put heat on your belly (abdomen) as told by your doctor. Use a heat source that your doctor recommends, such as a moist heat pack or a heating pad.  Put a towel between your skin and the heat source.  Leave the heat on for 20-30 minutes.  Remove the heat if your skin turns bright red. This is especially important if you cannot feel pain, heat, or cold. You can get burned. Eating and drinking  Drink enough fluid to keep your pee (urine) clear or pale yellow.  Return to your normal diet as told by your doctor. Avoid heavy or fried foods that are hard to digest.  Avoid drinking alcohol for as long as told by your doctor. Contact a doctor if:  You have blood in your poop (stool) 2-3 days after the procedure. Get help right away if:  You have more than a small amount of blood in your poop.  You see large clumps of tissue (blood clots) in your poop.  Your belly is swollen.  You feel sick to your stomach (nauseous).  You throw  up (vomit).  You have a fever.  You have belly pain that gets worse, and medicine does not help your pain. This information is not intended to replace advice given to you by your health care provider. Make sure you discuss any questions you have with your health care provider. Document Released: 04/16/2010 Document Revised: 12/07/2015 Document Reviewed: 12/07/2015 Elsevier Interactive Patient Education  2017 [employer]    Diverticulosis Diverticulosis is the condition that develops when small pouches (diverticula) form in the wall of your colon. Your colon, or large intestine, is where water is absorbed and stool is formed. The pouches form when the inside layer of your colon pushes through weak spots in the outer layers of your colon. CAUSES  No one knows exactly what causes diverticulosis. RISK FACTORS  Being older than 51. Your risk for this condition increases with age. Diverticulosis is rare in people younger than 40 years. By age 51, almost everyone has it.  Eating a low-fiber diet.  Being frequently constipated.  Being overweight.  Not getting enough exercise.  Smoking.  Taking over-the-counter pain medicines, like aspirin and ibuprofen. SYMPTOMS  Most people with diverticulosis do not have symptoms. DIAGNOSIS  Because diverticulosis often has no symptoms, health care providers often discover the condition during an exam for other colon problems. In many cases, a health care provider will diagnose diverticulosis while using a flexible scope to examine the colon (colonoscopy).  TREATMENT  If you have never developed an infection related to diverticulosis, you may not need treatment. If you have had an infection before, treatment may include:  Eating more fruits, vegetables, and grains.  Taking a fiber supplement.  Taking a live bacteria supplement (probiotic).  Taking medicine to relax your colon. HOME CARE INSTRUCTIONS   Drink at least 6-8 glasses of water each  day to prevent constipation.  Try not to strain when you have a bowel movement.  Keep all follow-up appointments. If you have had an infection before:  Increase the fiber in your diet as directed by your health care provider or dietitian.  Take a dietary fiber supplement if your health care provider approves.  Only take medicines as directed by your health care provider. SEEK MEDICAL CARE IF:   You have abdominal pain.  You have bloating.  You have cramps.  You have not gone to the bathroom in 3 days. SEEK IMMEDIATE MEDICAL CARE IF:   Your pain gets worse.  Yourbloating becomes very bad.  You have a fever or chills, and your symptoms suddenly get worse.  You begin vomiting.  You have bowel movements that are bloody or black. MAKE SURE YOU:  Understand these instructions.  Will watch your condition.  Will get help right away if you are not doing well or get worse. This information is not intended to replace advice given to you by your health care provider. Make sure you discuss any questions you have with your health care provider. Document Released: 12/10/2003 Document Revised: 03/19/2013 Document Reviewed: 02/06/2013 Elsevier Interactive Patient Education  2017 [employer].    Hemorrhoids Hemorrhoids are swollen veins in and around the rectum or anus. There are two types of hemorrhoids:  Internal hemorrhoids. These occur in the veins that are just inside the rectum. They may poke through to the outside and become irritated and painful.  External hemorrhoids. These occur in the veins that are outside of the anus and can be felt as a painful swelling or hard lump near the anus. Most hemorrhoids do not cause serious problems, and they can be managed with home treatments such as diet and lifestyle changes. If home treatments do not help your symptoms, procedures can be done to shrink or remove the hemorrhoids. What are the causes? This condition is caused by  increased pressure in the anal area. This pressure may result from various things, including:  Constipation.  Straining to have a bowel movement.  Diarrhea.  Pregnancy.  Obesity.  Sitting for long periods of time.  Heavy lifting or other activity that causes you to strain.  Anal sex. What are the signs or symptoms? Symptoms of this condition include:  Pain.  Anal itching or irritation.  Rectal bleeding.  Leakage of stool (feces).  Anal swelling.  One or more lumps around the anus. How is this diagnosed? This condition can often be diagnosed through a visual exam. Other exams or tests may also be done, such as:  Examination of the rectal area with a gloved hand (digital rectal exam).  Examination of the anal canal using a small tube (anoscope).  A blood test, if you have lost a significant amount of blood.  A test to look inside the colon (sigmoidoscopy or colonoscopy). How is this treated? This condition can usually be treated at home. However, various procedures may be done if dietary changes, lifestyle changes, and other home treatments do not help your symptoms. These procedures can help make the hemorrhoids  smaller or remove them completely. Some of these procedures involve surgery, and others do not. Common procedures include:  Rubber band ligation. Rubber bands are placed at the base of the hemorrhoids to cut off the blood supply to them.  Sclerotherapy. Medicine is injected into the hemorrhoids to shrink them.  Infrared coagulation. A type of light energy is used to get rid of the hemorrhoids.  Hemorrhoidectomy surgery. The hemorrhoids are surgically removed, and the veins that supply them are tied off.  Stapled hemorrhoidopexy surgery. A circular stapling device is used to remove the hemorrhoids and use staples to cut off the blood supply to them. Follow these instructions at home: Eating and drinking  Eat foods that have a lot of fiber in them, such as  whole grains, beans, nuts, fruits, and vegetables. Ask your health care provider about taking products that have added fiber (fiber supplements).  Drink enough fluid to keep your urine clear or pale yellow. Managing pain and swelling  Take warm sitz baths for 20 minutes, 3-4 times a day to ease pain and discomfort.  If directed, apply ice to the affected area. Using ice packs between sitz baths may be helpful.  Put ice in a plastic bag.  Place a towel between your skin and the bag.  Leave the ice on for 20 minutes, 2-3 times a day. General instructions  Take over-the-counter and prescription medicines only as told by your health care provider.  Use medicated creams or suppositories as told.  Exercise regularly.  Go to the bathroom when you have the urge to have a bowel movement. Do not wait.  Avoid straining to have bowel movements.  Keep the anal area dry and clean. Use wet toilet paper or moist towelettes after a bowel movement.  Do not sit on the toilet for long periods of time. This increases blood pooling and pain. Contact a health care provider if:  You have increasing pain and swelling that are not controlled by treatment or medicine.  You have uncontrolled bleeding.  You have difficulty having a bowel movement, or you are unable to have a bowel movement.  You have pain or inflammation outside the area of the hemorrhoids. This information is not intended to replace advice given to you by your health care provider. Make sure you discuss any questions you have with your health care provider. Document Released: 03/11/2000 Document Revised: 08/12/2015 Document Reviewed: 11/26/2014 Elsevier Interactive Patient Education  2017 [employer].

## 2016-03-14 NOTE — Op Note (Signed)
[facility] [name] Procedure Date: 03/14/2016 8:59 AM MRN: [id number] Date of Birth: [birth year] Attending MD: Hildred Laser , MD CSN: [id number] Age: 55 Admit Type: Outpatient Procedure:                Colonoscopy Indications:              Heme positive stool Providers:                Hildred Laser, MD, Otis Peak B. Sharon Seller, RN, Aram Candela Referring MD:             Monico Blitz, MD Medicines:                Promethazine 25 mg IV, Meperidine 75 mg IV,                            Midazolam 8 mg IV Complications:            No immediate complications. Estimated Blood Loss:     Estimated blood loss: none. Estimated blood loss                            was minimal. Procedure:                Pre-Anesthesia Assessment:                           - Prior to the procedure, a History and Physical                            was performed, and patient medications and                            allergies were reviewed. The patient's tolerance of                            previous anesthesia was also reviewed. The risks                            and benefits of the procedure and the sedation                            options and risks were discussed with the patient.                            All questions were answered, and informed consent                            was obtained. Prior Anticoagulants: The patient                            last took aspirin 1 day prior to the procedure. ASA  Grade Assessment: II - A patient with mild systemic                            disease. After reviewing the risks and benefits,                            the patient was deemed in satisfactory condition to                            undergo the procedure.                           After obtaining informed consent, the colonoscope                            was passed under direct vision. Throughout the      procedure, the patient's blood pressure, pulse, and                            oxygen saturations were monitored continuously. The                            [id number]) scope was introduced through                            the anus and advanced to the the cecum, identified                            by appendiceal orifice and ileocecal valve. The                            colonoscopy was somewhat difficult due to a                            tortuous colon. Successful completion of the                            procedure was aided by increasing the dose of                            sedation medication and changing the patient to a                            supine position. The patient tolerated the                            procedure well. The quality of the bowel                            preparation was adequate to identify polyps. The                            ileocecal valve, appendiceal orifice, and rectum  were photographed. Scope In: 9:36:57 AM Scope Out: 10:07:28 AM Scope Withdrawal Time: 0 hours 11 minutes 57 seconds  Total Procedure Duration: 0 hours 30 minutes 31 seconds  Findings:      The perianal and digital rectal examinations were normal.      A 7 mm polyp was found in the ascending colon. The polyp was       semi-sessile. The polyp was removed with a cold snare. Resection and       retrieval were complete. The pathology specimen was placed into Bottle       Number 1.      A 4 mm polyp was found in the distal sigmoid colon. The polyp was       sessile. The polyp was removed with a cold snare. Resection and       retrieval were complete. The pathology specimen was placed into Bottle       Number 1.      External hemorrhoids were found during retroflexion. The hemorrhoids       were small.      A few small-mouthed diverticula were found in the sigmoid colon. Impression:               - One 7 mm polyp in the ascending colon,  removed                            with a cold snare. Resected and retrieved.                           - One 4 mm polyp in the distal sigmoid colon,                            removed with a cold snare. Resected and retrieved.                           - Few small diverticula at sigmoid colon.                           - External hemorrhoids. Moderate Sedation:      Moderate (conscious) sedation was administered by the endoscopy nurse       and supervised by the endoscopist. The following parameters were       monitored: oxygen saturation, heart rate, blood pressure, CO2       capnography and response to care. Total physician intraservice time was       41 minutes. Recommendation:           - Patient has a contact number available for                            emergencies. The signs and symptoms of potential                            delayed complications were discussed with the                            patient. Return to normal activities tomorrow.  Written discharge instructions were provided to the                            patient.                           - High fiber diet today.                           - Continue present medications.                           - No aspirin, ibuprofen, naproxen, or other                            non-steroidal anti-inflammatory drugs for 1 day.                           - Await pathology results.                           - Repeat colonoscopy for surveillance based on                            pathology results. Procedure Code(s):        --- Professional ---                           [id number], Colonoscopy, flexible; with removal of                            tumor(s), polyp(s), or other lesion(s) by snare                            technique                           99152, Moderate sedation services provided by the                            same physician or other qualified health care                             professional performing the diagnostic or                            therapeutic service that the sedation supports,                            requiring the presence of an independent trained                            observer to assist in the monitoring of the                            patient's level of consciousness and physiological  status; initial 15 minutes of intraservice time,                            patient age 73 years or older                           [phone], Moderate sedation services; each additional                            15 minutes intraservice time                           [phone], Moderate sedation services; each additional                            15 minutes intraservice time Diagnosis Code(s):        --- Professional ---                           D12.2, Benign neoplasm of ascending colon                           D12.5, Benign neoplasm of sigmoid colon                           K64.4, Residual hemorrhoidal skin tags                           R19.5, Other fecal abnormalities CPT copyright 2016 American Medical Association. All rights reserved. The codes documented in this report are preliminary and upon coder review may  be revised to meet current compliance requirements. Hildred Laser, MD Hildred Laser, MD 03/14/2016 10:18:13 AM This report has been signed electronically. Number of Addenda: 0
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
## 2016-03-16 ENCOUNTER — Encounter (HOSPITAL_COMMUNITY): Payer: Self-pay | Admitting: Emergency Medicine

## 2016-03-16 ENCOUNTER — Emergency Department (HOSPITAL_COMMUNITY): Payer: Medicare Other

## 2016-03-16 ENCOUNTER — Emergency Department (HOSPITAL_COMMUNITY)
Admission: EM | Admit: 2016-03-16 | Discharge: 2016-03-16 | Disposition: A | Discharge status: Home / Self Care | Payer: Medicare Other | Attending: Emergency Medicine | Admitting: Emergency Medicine

## 2016-03-16 DIAGNOSIS — I2510 Atherosclerotic heart disease of native coronary artery without angina pectoris: Secondary | ICD-10-CM | POA: Diagnosis not present

## 2016-03-16 DIAGNOSIS — F17210 Nicotine dependence, cigarettes, uncomplicated: Secondary | ICD-10-CM | POA: Insufficient documentation

## 2016-03-16 DIAGNOSIS — K529 Noninfective gastroenteritis and colitis, unspecified: Secondary | ICD-10-CM | POA: Diagnosis not present

## 2016-03-16 DIAGNOSIS — J449 Chronic obstructive pulmonary disease, unspecified: Secondary | ICD-10-CM | POA: Insufficient documentation

## 2016-03-16 DIAGNOSIS — R109 Unspecified abdominal pain: Secondary | ICD-10-CM | POA: Diagnosis present

## 2016-03-16 DIAGNOSIS — R1031 Right lower quadrant pain: Secondary | ICD-10-CM | POA: Diagnosis not present

## 2016-03-16 DIAGNOSIS — Z79899 Other long term (current) drug therapy: Secondary | ICD-10-CM | POA: Insufficient documentation

## 2016-03-16 LAB — URINALYSIS, ROUTINE W REFLEX MICROSCOPIC
Bacteria, UA: NONE SEEN
Bilirubin Urine: NEGATIVE
Glucose, UA: NEGATIVE mg/dL
Hgb urine dipstick: NEGATIVE
Ketones, ur: NEGATIVE mg/dL
Leukocytes, UA: NEGATIVE
Nitrite: NEGATIVE
Protein, ur: 30 mg/dL — AB
RBC / HPF: NONE SEEN RBC/hpf (ref 0–5)
Specific Gravity, Urine: 1.02 (ref 1.005–1.030)
pH: 6 (ref 5.0–8.0)

## 2016-03-16 LAB — CBC WITH DIFFERENTIAL/PLATELET
Basophils Absolute: 0 10*3/uL (ref 0.0–0.1)
Basophils Relative: 0 %
Eosinophils Absolute: 0.2 10*3/uL (ref 0.0–0.7)
Eosinophils Relative: 1 %
HCT: 48.8 % (ref 39.0–52.0)
Hemoglobin: 16.3 g/dL (ref 13.0–17.0)
Lymphocytes Relative: 27 %
Lymphs Abs: 3 10*3/uL (ref 0.7–4.0)
MCH: 30.8 pg (ref 26.0–34.0)
MCHC: 33.4 g/dL (ref 30.0–36.0)
MCV: 92.2 fL (ref 78.0–100.0)
Monocytes Absolute: 0.7 10*3/uL (ref 0.1–1.0)
Monocytes Relative: 6 %
Neutro Abs: 7 10*3/uL (ref 1.7–7.7)
Neutrophils Relative %: 66 %
Platelets: 237 10*3/uL (ref 150–400)
RBC: 5.29 MIL/uL (ref 4.22–5.81)
RDW: 14.2 % (ref 11.5–15.5)
WBC: 10.9 10*3/uL — ABNORMAL HIGH (ref 4.0–10.5)

## 2016-03-16 LAB — COMPREHENSIVE METABOLIC PANEL
ALT: 55 U/L (ref 17–63)
AST: 36 U/L (ref 15–41)
Albumin: 4 g/dL (ref 3.5–5.0)
Alkaline Phosphatase: 78 U/L (ref 38–126)
Anion gap: 7 (ref 5–15)
BUN: 10 mg/dL (ref 6–20)
CO2: 23 mmol/L (ref 22–32)
Calcium: 9.4 mg/dL (ref 8.9–10.3)
Chloride: 106 mmol/L (ref 101–111)
Creatinine, Ser: 0.93 mg/dL (ref 0.61–1.24)
GFR calc Af Amer: >60 mL/min (ref >60)
GFR calc non Af Amer: >60 mL/min (ref >60)
Glucose, Bld: 127 mg/dL — ABNORMAL HIGH (ref 65–99)
Potassium: 4.3 mmol/L (ref 3.5–5.1)
Sodium: 136 mmol/L (ref 135–145)
Total Bilirubin: 0.5 mg/dL (ref 0.3–1.2)
Total Protein: 7 g/dL (ref 6.5–8.1)

## 2016-03-16 LAB — LIPASE, BLOOD: Lipase: 33 U/L (ref 11–51)

## 2016-03-16 MED ORDER — HYDROCODONE-ACETAMINOPHEN 5-325 MG PO TABS: 1.0000 | ORAL_TABLET | ORAL | 0 refills | Status: DC | PRN

## 2016-03-16 MED ORDER — MORPHINE SULFATE (PF) 4 MG/ML IV SOLN
4.0000 mg | Freq: Once | INTRAVENOUS | Status: AC
  Administered 2016-03-16: 4 mg via INTRAVENOUS
  Filled 2016-03-16: qty 1

## 2016-03-16 MED ORDER — ONDANSETRON HCL 4 MG/2ML IJ SOLN
4.0000 mg | Freq: Once | INTRAMUSCULAR | Status: AC
  Administered 2016-03-16: 4 mg via INTRAVENOUS
  Filled 2016-03-16: qty 2

## 2016-03-16 NOTE — Discharge Instructions (Signed)
You may take the hydrocodone prescribed for pain relief.  This will make you drowsy - do not drive within 4 hours of taking this medication. ° °
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
## 2016-03-16 NOTE — ED Triage Notes (Signed)
Patient complaining of right sided abdominal pain since yesterday. States he had colonoscopy on Monday with polyps removed. Denies vomiting or diarrhea.

## 2016-03-17 ENCOUNTER — Encounter (HOSPITAL_COMMUNITY): Payer: Self-pay | Admitting: Internal Medicine

## 2016-04-08 DIAGNOSIS — I10 Essential (primary) hypertension: Secondary | ICD-10-CM | POA: Diagnosis not present

## 2016-04-08 DIAGNOSIS — Z299 Encounter for prophylactic measures, unspecified: Secondary | ICD-10-CM | POA: Diagnosis not present

## 2016-04-08 DIAGNOSIS — Z713 Dietary counseling and surveillance: Secondary | ICD-10-CM | POA: Diagnosis not present

## 2016-04-08 DIAGNOSIS — F329 Major depressive disorder, single episode, unspecified: Secondary | ICD-10-CM | POA: Diagnosis not present

## 2016-04-08 DIAGNOSIS — Z6837 Body mass index (BMI) 37.0-37.9, adult: Secondary | ICD-10-CM | POA: Diagnosis not present

## 2016-04-08 DIAGNOSIS — Z720 Tobacco use: Secondary | ICD-10-CM | POA: Diagnosis not present

## 2016-04-08 DIAGNOSIS — F17200 Nicotine dependence, unspecified, uncomplicated: Secondary | ICD-10-CM | POA: Diagnosis not present

## 2016-04-08 DIAGNOSIS — F17210 Nicotine dependence, cigarettes, uncomplicated: Secondary | ICD-10-CM | POA: Diagnosis not present

## 2016-06-23 DIAGNOSIS — M5126 Other intervertebral disc displacement, lumbar region: Secondary | ICD-10-CM | POA: Diagnosis not present

## 2016-09-16 DIAGNOSIS — M5126 Other intervertebral disc displacement, lumbar region: Secondary | ICD-10-CM | POA: Diagnosis not present

## 2016-10-07 DIAGNOSIS — Z713 Dietary counseling and surveillance: Secondary | ICD-10-CM | POA: Diagnosis not present

## 2016-10-07 DIAGNOSIS — I10 Essential (primary) hypertension: Secondary | ICD-10-CM | POA: Diagnosis not present

## 2016-10-07 DIAGNOSIS — Z6835 Body mass index (BMI) 35.0-35.9, adult: Secondary | ICD-10-CM | POA: Diagnosis not present

## 2016-10-07 DIAGNOSIS — Z299 Encounter for prophylactic measures, unspecified: Secondary | ICD-10-CM | POA: Diagnosis not present

## 2016-10-07 DIAGNOSIS — F329 Major depressive disorder, single episode, unspecified: Secondary | ICD-10-CM | POA: Diagnosis not present

## 2016-10-07 DIAGNOSIS — I2510 Atherosclerotic heart disease of native coronary artery without angina pectoris: Secondary | ICD-10-CM | POA: Diagnosis not present

## 2016-10-07 DIAGNOSIS — E7800 Pure hypercholesterolemia, unspecified: Secondary | ICD-10-CM | POA: Diagnosis not present

## 2016-10-07 DIAGNOSIS — I878 Other specified disorders of veins: Secondary | ICD-10-CM | POA: Diagnosis not present

## 2016-12-16 DIAGNOSIS — M5126 Other intervertebral disc displacement, lumbar region: Secondary | ICD-10-CM | POA: Insufficient documentation

## 2016-12-30 DIAGNOSIS — Z0000 Encounter for general adult medical examination without abnormal findings: Secondary | ICD-10-CM | POA: Diagnosis not present

## 2016-12-30 DIAGNOSIS — Z1331 Encounter for screening for depression: Secondary | ICD-10-CM | POA: Diagnosis not present

## 2016-12-30 DIAGNOSIS — Z125 Encounter for screening for malignant neoplasm of prostate: Secondary | ICD-10-CM | POA: Diagnosis not present

## 2016-12-30 DIAGNOSIS — I2510 Atherosclerotic heart disease of native coronary artery without angina pectoris: Secondary | ICD-10-CM | POA: Diagnosis not present

## 2016-12-30 DIAGNOSIS — Z7189 Other specified counseling: Secondary | ICD-10-CM | POA: Diagnosis not present

## 2016-12-30 DIAGNOSIS — Z79899 Other long term (current) drug therapy: Secondary | ICD-10-CM | POA: Diagnosis not present

## 2016-12-30 DIAGNOSIS — I878 Other specified disorders of veins: Secondary | ICD-10-CM | POA: Diagnosis not present

## 2016-12-30 DIAGNOSIS — I10 Essential (primary) hypertension: Secondary | ICD-10-CM | POA: Diagnosis not present

## 2016-12-30 DIAGNOSIS — Z1211 Encounter for screening for malignant neoplasm of colon: Secondary | ICD-10-CM | POA: Diagnosis not present

## 2016-12-30 DIAGNOSIS — Z299 Encounter for prophylactic measures, unspecified: Secondary | ICD-10-CM | POA: Diagnosis not present

## 2016-12-30 DIAGNOSIS — Z6835 Body mass index (BMI) 35.0-35.9, adult: Secondary | ICD-10-CM | POA: Diagnosis not present

## 2016-12-30 DIAGNOSIS — Z1339 Encounter for screening examination for other mental health and behavioral disorders: Secondary | ICD-10-CM | POA: Diagnosis not present

## 2016-12-30 DIAGNOSIS — R5383 Other fatigue: Secondary | ICD-10-CM | POA: Diagnosis not present

## 2016-12-30 DIAGNOSIS — F17210 Nicotine dependence, cigarettes, uncomplicated: Secondary | ICD-10-CM | POA: Diagnosis not present

## 2016-12-30 DIAGNOSIS — E7800 Pure hypercholesterolemia, unspecified: Secondary | ICD-10-CM | POA: Diagnosis not present

## 2017-03-17 DIAGNOSIS — M5126 Other intervertebral disc displacement, lumbar region: Secondary | ICD-10-CM | POA: Diagnosis not present

## 2017-05-19 DIAGNOSIS — I878 Other specified disorders of veins: Secondary | ICD-10-CM | POA: Diagnosis not present

## 2017-05-19 DIAGNOSIS — Z6837 Body mass index (BMI) 37.0-37.9, adult: Secondary | ICD-10-CM | POA: Diagnosis not present

## 2017-05-19 DIAGNOSIS — F17210 Nicotine dependence, cigarettes, uncomplicated: Secondary | ICD-10-CM | POA: Diagnosis not present

## 2017-05-19 DIAGNOSIS — Z299 Encounter for prophylactic measures, unspecified: Secondary | ICD-10-CM | POA: Diagnosis not present

## 2017-05-19 DIAGNOSIS — F329 Major depressive disorder, single episode, unspecified: Secondary | ICD-10-CM | POA: Diagnosis not present

## 2017-05-19 DIAGNOSIS — I10 Essential (primary) hypertension: Secondary | ICD-10-CM | POA: Diagnosis not present

## 2017-06-09 DIAGNOSIS — M5126 Other intervertebral disc displacement, lumbar region: Secondary | ICD-10-CM | POA: Diagnosis not present

## 2017-07-18 DIAGNOSIS — R1031 Right lower quadrant pain: Secondary | ICD-10-CM | POA: Diagnosis not present

## 2017-07-18 DIAGNOSIS — I10 Essential (primary) hypertension: Secondary | ICD-10-CM | POA: Diagnosis not present

## 2017-07-18 DIAGNOSIS — R112 Nausea with vomiting, unspecified: Secondary | ICD-10-CM | POA: Diagnosis not present

## 2017-07-18 DIAGNOSIS — F17200 Nicotine dependence, unspecified, uncomplicated: Secondary | ICD-10-CM | POA: Diagnosis not present

## 2017-07-18 DIAGNOSIS — K429 Umbilical hernia without obstruction or gangrene: Secondary | ICD-10-CM | POA: Diagnosis not present

## 2017-07-18 DIAGNOSIS — Z79899 Other long term (current) drug therapy: Secondary | ICD-10-CM | POA: Diagnosis not present

## 2017-10-06 DIAGNOSIS — M5126 Other intervertebral disc displacement, lumbar region: Secondary | ICD-10-CM | POA: Diagnosis not present

## 2018-01-05 DIAGNOSIS — R5383 Other fatigue: Secondary | ICD-10-CM | POA: Diagnosis not present

## 2018-01-05 DIAGNOSIS — Z0000 Encounter for general adult medical examination without abnormal findings: Secondary | ICD-10-CM | POA: Diagnosis not present

## 2018-01-05 DIAGNOSIS — E7800 Pure hypercholesterolemia, unspecified: Secondary | ICD-10-CM | POA: Diagnosis not present

## 2018-01-05 DIAGNOSIS — Z7189 Other specified counseling: Secondary | ICD-10-CM | POA: Diagnosis not present

## 2018-01-05 DIAGNOSIS — Z125 Encounter for screening for malignant neoplasm of prostate: Secondary | ICD-10-CM | POA: Diagnosis not present

## 2018-01-05 DIAGNOSIS — Z6836 Body mass index (BMI) 36.0-36.9, adult: Secondary | ICD-10-CM | POA: Diagnosis not present

## 2018-01-05 DIAGNOSIS — Z1339 Encounter for screening examination for other mental health and behavioral disorders: Secondary | ICD-10-CM | POA: Diagnosis not present

## 2018-01-05 DIAGNOSIS — Z1211 Encounter for screening for malignant neoplasm of colon: Secondary | ICD-10-CM | POA: Diagnosis not present

## 2018-01-05 DIAGNOSIS — Z299 Encounter for prophylactic measures, unspecified: Secondary | ICD-10-CM | POA: Diagnosis not present

## 2018-01-05 DIAGNOSIS — Z1331 Encounter for screening for depression: Secondary | ICD-10-CM | POA: Diagnosis not present

## 2018-01-05 DIAGNOSIS — Z79899 Other long term (current) drug therapy: Secondary | ICD-10-CM | POA: Diagnosis not present

## 2018-01-08 DIAGNOSIS — M5126 Other intervertebral disc displacement, lumbar region: Secondary | ICD-10-CM | POA: Diagnosis not present

## 2018-01-08 DIAGNOSIS — K429 Umbilical hernia without obstruction or gangrene: Secondary | ICD-10-CM | POA: Diagnosis not present

## 2018-01-08 DIAGNOSIS — M545 Low back pain: Secondary | ICD-10-CM | POA: Diagnosis not present

## 2018-01-12 DIAGNOSIS — Z809 Family history of malignant neoplasm, unspecified: Secondary | ICD-10-CM | POA: Diagnosis not present

## 2018-01-12 DIAGNOSIS — E7800 Pure hypercholesterolemia, unspecified: Secondary | ICD-10-CM | POA: Diagnosis not present

## 2018-01-12 DIAGNOSIS — Z888 Allergy status to other drugs, medicaments and biological substances status: Secondary | ICD-10-CM | POA: Diagnosis not present

## 2018-01-12 DIAGNOSIS — Z79899 Other long term (current) drug therapy: Secondary | ICD-10-CM | POA: Diagnosis not present

## 2018-01-12 DIAGNOSIS — K429 Umbilical hernia without obstruction or gangrene: Secondary | ICD-10-CM | POA: Diagnosis not present

## 2018-01-12 DIAGNOSIS — F329 Major depressive disorder, single episode, unspecified: Secondary | ICD-10-CM | POA: Diagnosis not present

## 2018-01-12 DIAGNOSIS — F419 Anxiety disorder, unspecified: Secondary | ICD-10-CM | POA: Diagnosis not present

## 2018-01-12 DIAGNOSIS — Z886 Allergy status to analgesic agent status: Secondary | ICD-10-CM | POA: Diagnosis not present

## 2018-01-12 DIAGNOSIS — F17200 Nicotine dependence, unspecified, uncomplicated: Secondary | ICD-10-CM | POA: Diagnosis not present

## 2018-01-12 DIAGNOSIS — Z8249 Family history of ischemic heart disease and other diseases of the circulatory system: Secondary | ICD-10-CM | POA: Diagnosis not present

## 2018-01-12 DIAGNOSIS — J449 Chronic obstructive pulmonary disease, unspecified: Secondary | ICD-10-CM | POA: Diagnosis not present

## 2018-01-12 DIAGNOSIS — I10 Essential (primary) hypertension: Secondary | ICD-10-CM | POA: Diagnosis not present

## 2018-01-12 DIAGNOSIS — Z981 Arthrodesis status: Secondary | ICD-10-CM | POA: Diagnosis not present

## 2018-01-15 DIAGNOSIS — K429 Umbilical hernia without obstruction or gangrene: Secondary | ICD-10-CM | POA: Diagnosis not present

## 2018-01-15 DIAGNOSIS — F419 Anxiety disorder, unspecified: Secondary | ICD-10-CM | POA: Diagnosis not present

## 2018-01-15 DIAGNOSIS — I10 Essential (primary) hypertension: Secondary | ICD-10-CM | POA: Diagnosis not present

## 2018-01-15 DIAGNOSIS — J449 Chronic obstructive pulmonary disease, unspecified: Secondary | ICD-10-CM | POA: Diagnosis not present

## 2018-01-15 DIAGNOSIS — F329 Major depressive disorder, single episode, unspecified: Secondary | ICD-10-CM | POA: Diagnosis not present

## 2018-01-15 DIAGNOSIS — E7800 Pure hypercholesterolemia, unspecified: Secondary | ICD-10-CM | POA: Diagnosis not present

## 2018-02-05 DIAGNOSIS — I10 Essential (primary) hypertension: Secondary | ICD-10-CM | POA: Diagnosis not present

## 2018-02-05 DIAGNOSIS — Z6836 Body mass index (BMI) 36.0-36.9, adult: Secondary | ICD-10-CM | POA: Diagnosis not present

## 2018-02-05 DIAGNOSIS — L309 Dermatitis, unspecified: Secondary | ICD-10-CM | POA: Diagnosis not present

## 2018-02-05 DIAGNOSIS — D1722 Benign lipomatous neoplasm of skin and subcutaneous tissue of left arm: Secondary | ICD-10-CM | POA: Diagnosis not present

## 2018-02-05 DIAGNOSIS — I2510 Atherosclerotic heart disease of native coronary artery without angina pectoris: Secondary | ICD-10-CM | POA: Diagnosis not present

## 2018-02-05 DIAGNOSIS — Z299 Encounter for prophylactic measures, unspecified: Secondary | ICD-10-CM | POA: Diagnosis not present

## 2018-05-16 DIAGNOSIS — Z6836 Body mass index (BMI) 36.0-36.9, adult: Secondary | ICD-10-CM | POA: Diagnosis not present

## 2018-05-16 DIAGNOSIS — F17210 Nicotine dependence, cigarettes, uncomplicated: Secondary | ICD-10-CM | POA: Diagnosis not present

## 2018-05-16 DIAGNOSIS — I10 Essential (primary) hypertension: Secondary | ICD-10-CM | POA: Diagnosis not present

## 2018-05-16 DIAGNOSIS — F329 Major depressive disorder, single episode, unspecified: Secondary | ICD-10-CM | POA: Diagnosis not present

## 2018-05-16 DIAGNOSIS — Z299 Encounter for prophylactic measures, unspecified: Secondary | ICD-10-CM | POA: Diagnosis not present

## 2018-05-16 DIAGNOSIS — M549 Dorsalgia, unspecified: Secondary | ICD-10-CM | POA: Diagnosis not present

## 2018-06-14 DIAGNOSIS — I2510 Atherosclerotic heart disease of native coronary artery without angina pectoris: Secondary | ICD-10-CM | POA: Diagnosis not present

## 2018-06-14 DIAGNOSIS — I10 Essential (primary) hypertension: Secondary | ICD-10-CM | POA: Diagnosis not present

## 2018-06-14 DIAGNOSIS — Z6835 Body mass index (BMI) 35.0-35.9, adult: Secondary | ICD-10-CM | POA: Diagnosis not present

## 2018-06-14 DIAGNOSIS — M549 Dorsalgia, unspecified: Secondary | ICD-10-CM | POA: Diagnosis not present

## 2018-06-14 DIAGNOSIS — Z299 Encounter for prophylactic measures, unspecified: Secondary | ICD-10-CM | POA: Diagnosis not present

## 2018-07-11 IMAGING — CT CT ABD-PELV W/O CM
2 of 4 series · 16 of 46 positions shown, 18 images · non-contrast
Comparison: 05/17/2013

CLINICAL DATA: Abdominal pain, right lower quadrant pain starting
03/15/2016, colonoscopy 03/14/2016

EXAM:
CT ABDOMEN AND PELVIS WITHOUT CONTRAST
TECHNIQUE: Multidetector CT imaging of the abdomen and pelvis was performed
following the standard protocol without IV contrast.

[Series 2: axial st · axial · 0.90mm/px · z∈[+933,+1358]mm · 13 of 93 slices shown, 15 images]
[im 4/93  soft-tissue]
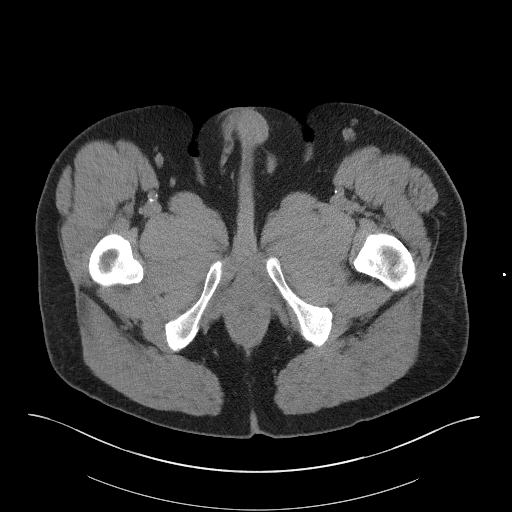
[im 4/93  bone]
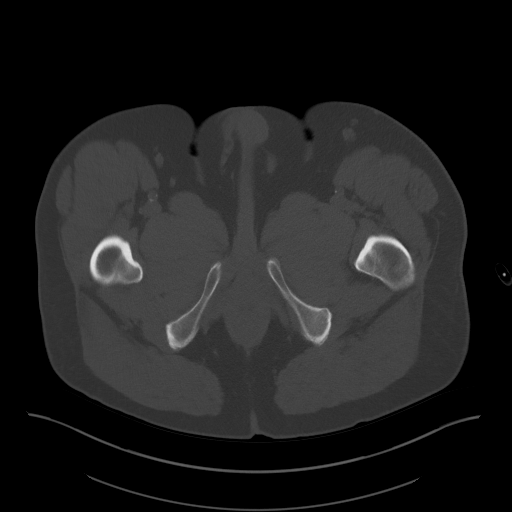
[im 12/93  soft-tissue]
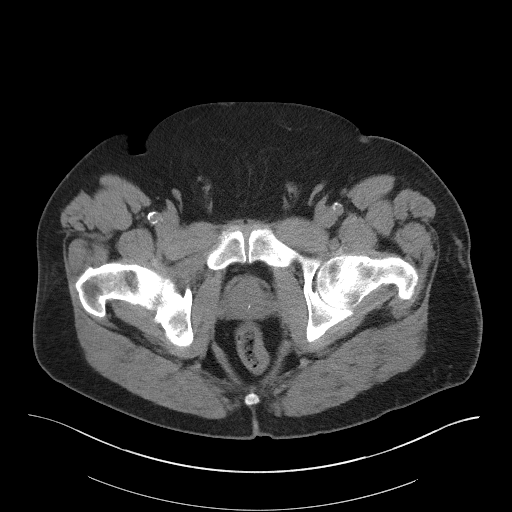
[im 20/93  soft-tissue]
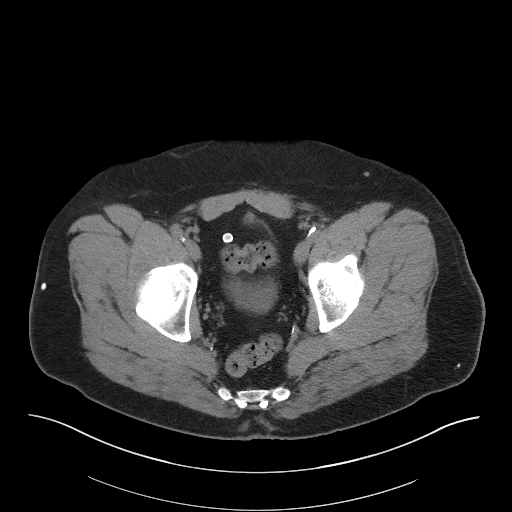
[im 27/93  soft-tissue]
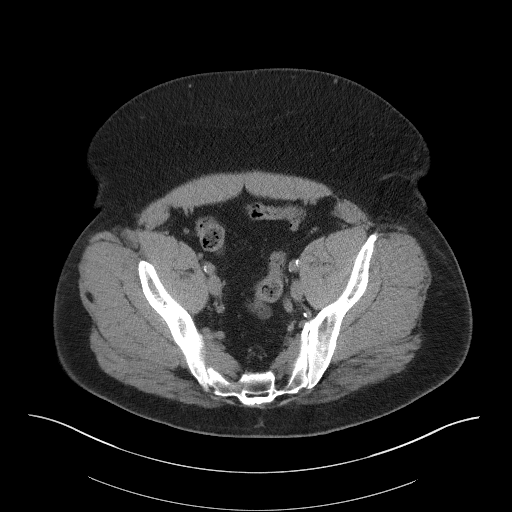
[im 31/93  soft-tissue]
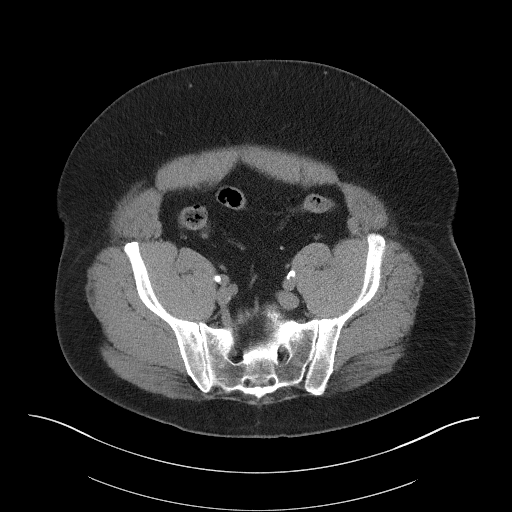
[im 39/93  soft-tissue]
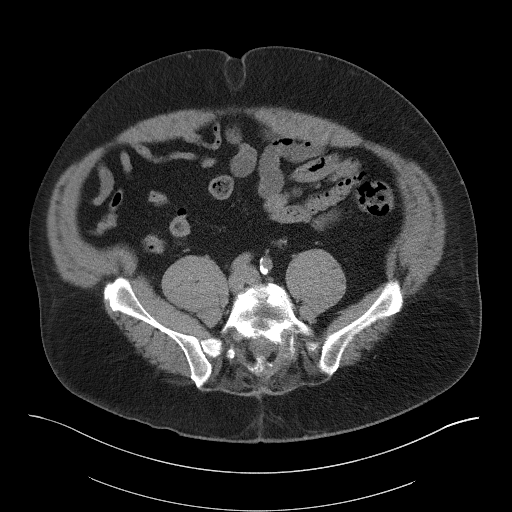
[im 47/93  soft-tissue]
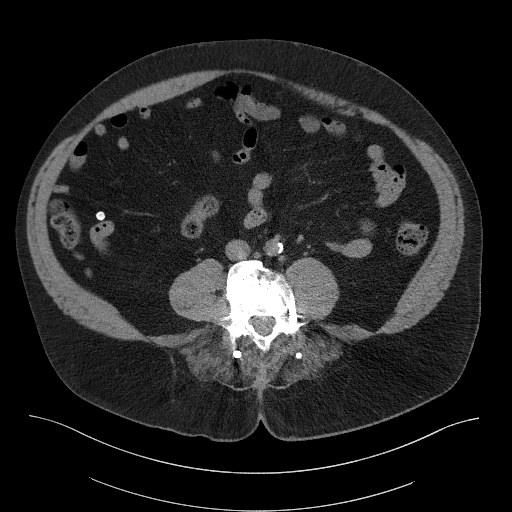
[im 54/93  soft-tissue]
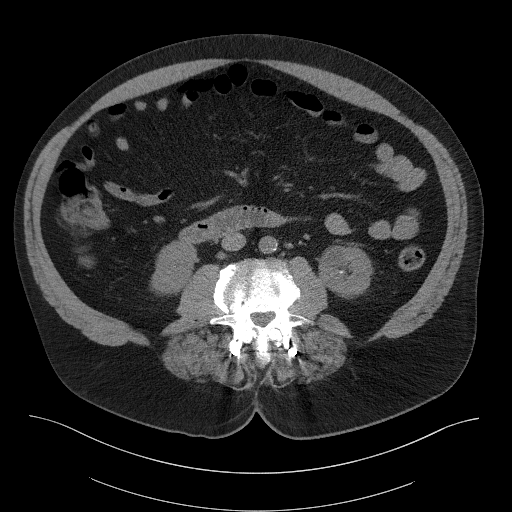
[im 62/93  soft-tissue]
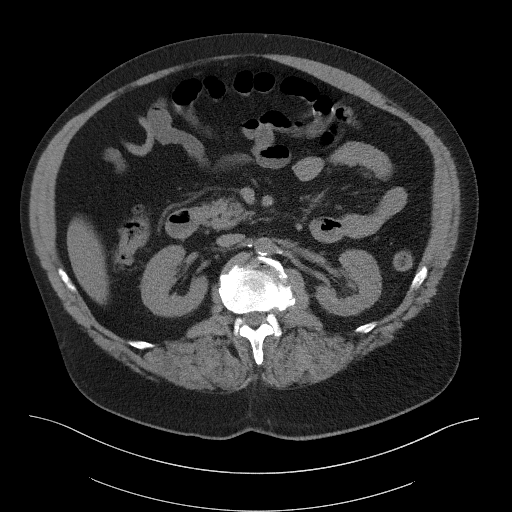
[im 62/93  bone]
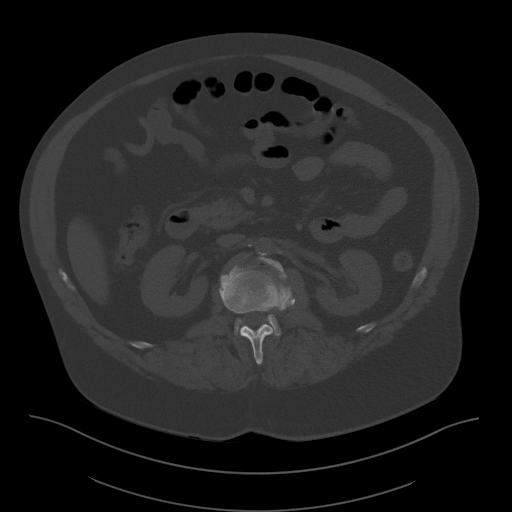
[im 66/93  soft-tissue]
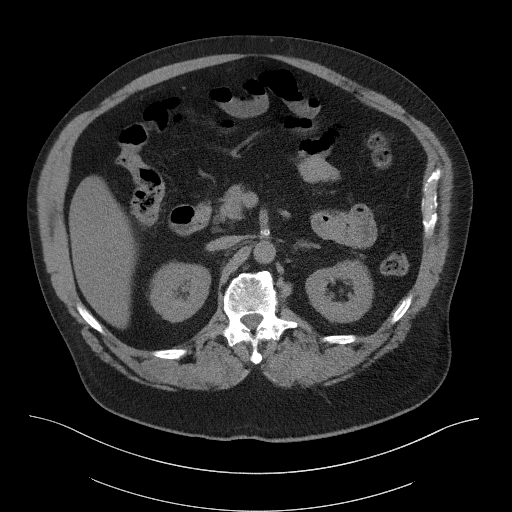
[im 73/93  soft-tissue]
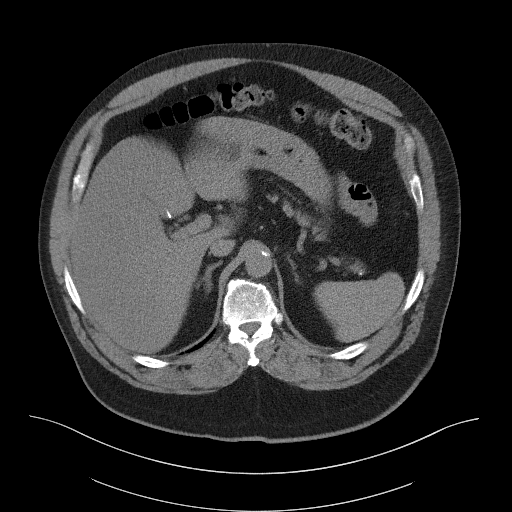
[im 81/93  soft-tissue]
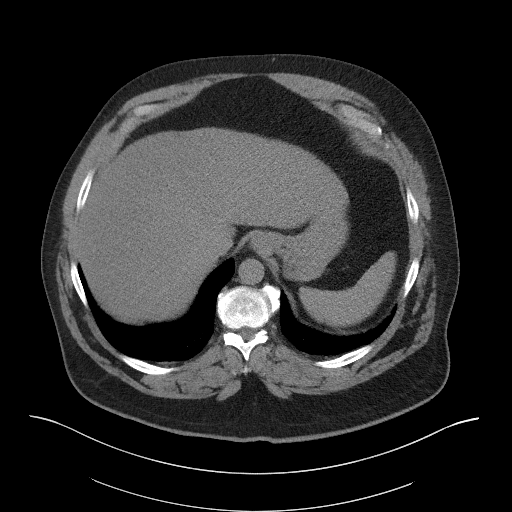
[im 89/93  soft-tissue]
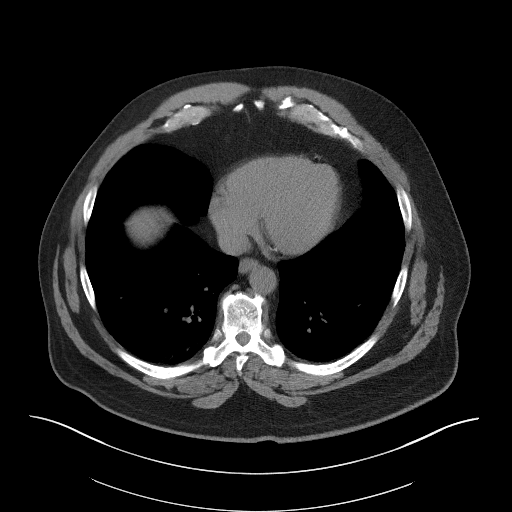

[Series 4: coronal st · coronal · 1.00mm/px · 3 of 136 slices shown]
[im 46/136  soft-tissue]
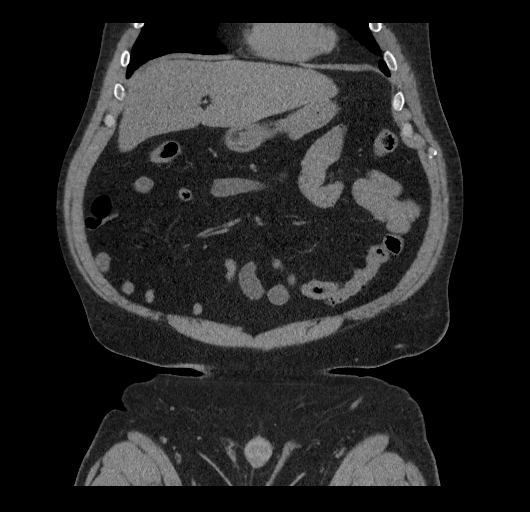
[im 61/136  soft-tissue]
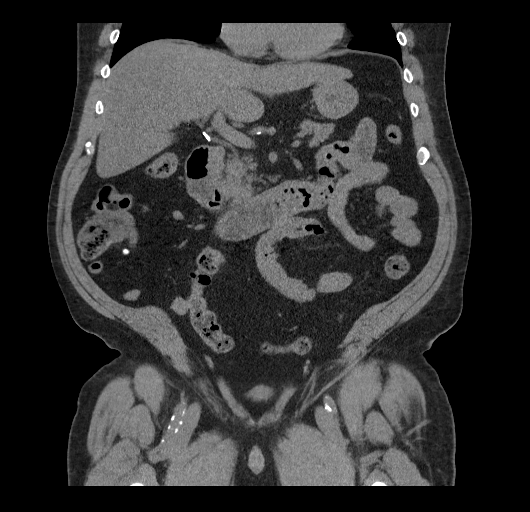
[im 76/136  soft-tissue]
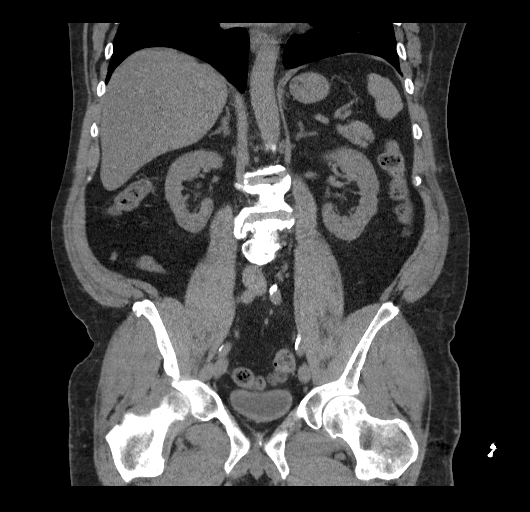

[16 of 46 positions shown; findings below may reference images not displayed]

FINDINGS: Lower chest: The study is limited without IV or oral contrast. Mild
bandlike atelectasis noted in right base posteriorly.

Hepatobiliary: Unenhanced liver shows no biliary ductal dilatation.
Status post cholecystectomy.

Pancreas: Unenhanced pancreas is normal.

Spleen: Unenhanced spleen is normal.

Adrenals/Urinary Tract: No adrenal gland mass. Unenhanced kidneys
are symmetrical in size. No hydronephrosis or hydroureter. No
calcified ureteral calculi. No calcified calculi are noted within
under distended urinary bladder.

Stomach/Bowel: There is no small bowel obstruction. No thickened or
dilated small bowel loops. No pericecal inflammation. Normal
appendix is noted in axial image 48.

There is some colonic stool within descending colon and sigmoid
colon. Few diverticula are noted sigmoid colon. There is a redundant
sigmoid colon. Axial image 53 there is mild focal stranding of
pericolonic fat in mid sigmoid colon just anterior to right psoas
muscle. Findings suspicious for mild focal panniculitis or colitis.
No pericolonic abscess or fluid collection. No pericolonic hematoma
or fluid collection. No distal colonic obstruction. Some colonic
stool noted in rectosigmoid colon.

Vascular/Lymphatic: Atherosclerotic calcifications of abdominal
aorta, iliac artery, celiac trunk and SMA origin. No adenopathy.

Reproductive: Prostate gland seminal vesicles are unremarkable.

Other: No ascites or free abdominal air. There is a umbilical hernia
containing fat without evidence of acute complication measures
cm.

Musculoskeletal: No destructive bony lesions are noted. There are
degenerative changes thoracolumbar spine. Postsurgical changes with
posterior fusion noted at L3-L4 level. Postsurgical changes are
noted L3-L4-L4-L5 and L5-S1 disc space.
IMPRESSION: 1. Axial image 53 there is mild focal stranding of pericolonic fat
in mid sigmoid colon just anterior to right psoas muscle. Findings
suspicious for mild focal panniculitis or colitis. No pericolonic
abscess or fluid collection. No pericolonic hematoma or fluid
collection.
2. Status post cholecystectomy.
3. Normal appendix.  No pericecal inflammation.
4. No small bowel obstruction.
5. Small umbilical hernia containing fat without evidence of acute
complication.
6. No hydronephrosis or hydroureter.
7. Postsurgical changes and degenerative changes lumbar spine.
8. No ascites or free abdominal air.

## 2018-07-16 DIAGNOSIS — M549 Dorsalgia, unspecified: Secondary | ICD-10-CM | POA: Diagnosis not present

## 2018-07-16 DIAGNOSIS — I2510 Atherosclerotic heart disease of native coronary artery without angina pectoris: Secondary | ICD-10-CM | POA: Diagnosis not present

## 2018-07-16 DIAGNOSIS — Z6836 Body mass index (BMI) 36.0-36.9, adult: Secondary | ICD-10-CM | POA: Diagnosis not present

## 2018-07-16 DIAGNOSIS — I10 Essential (primary) hypertension: Secondary | ICD-10-CM | POA: Diagnosis not present

## 2018-07-16 DIAGNOSIS — Z299 Encounter for prophylactic measures, unspecified: Secondary | ICD-10-CM | POA: Diagnosis not present

## 2018-08-15 DIAGNOSIS — Z6836 Body mass index (BMI) 36.0-36.9, adult: Secondary | ICD-10-CM | POA: Diagnosis not present

## 2018-08-15 DIAGNOSIS — M549 Dorsalgia, unspecified: Secondary | ICD-10-CM | POA: Diagnosis not present

## 2018-08-15 DIAGNOSIS — Z79899 Other long term (current) drug therapy: Secondary | ICD-10-CM | POA: Diagnosis not present

## 2018-08-15 DIAGNOSIS — I10 Essential (primary) hypertension: Secondary | ICD-10-CM | POA: Diagnosis not present

## 2018-08-15 DIAGNOSIS — Z299 Encounter for prophylactic measures, unspecified: Secondary | ICD-10-CM | POA: Diagnosis not present

## 2018-09-17 DIAGNOSIS — I2510 Atherosclerotic heart disease of native coronary artery without angina pectoris: Secondary | ICD-10-CM | POA: Diagnosis not present

## 2018-09-17 DIAGNOSIS — Z299 Encounter for prophylactic measures, unspecified: Secondary | ICD-10-CM | POA: Diagnosis not present

## 2018-09-17 DIAGNOSIS — Z6836 Body mass index (BMI) 36.0-36.9, adult: Secondary | ICD-10-CM | POA: Diagnosis not present

## 2018-09-17 DIAGNOSIS — I10 Essential (primary) hypertension: Secondary | ICD-10-CM | POA: Diagnosis not present

## 2018-09-17 DIAGNOSIS — M549 Dorsalgia, unspecified: Secondary | ICD-10-CM | POA: Diagnosis not present

## 2018-10-16 DIAGNOSIS — Z6836 Body mass index (BMI) 36.0-36.9, adult: Secondary | ICD-10-CM | POA: Diagnosis not present

## 2018-10-16 DIAGNOSIS — Z299 Encounter for prophylactic measures, unspecified: Secondary | ICD-10-CM | POA: Diagnosis not present

## 2018-10-16 DIAGNOSIS — Z79899 Other long term (current) drug therapy: Secondary | ICD-10-CM | POA: Diagnosis not present

## 2018-10-16 DIAGNOSIS — I10 Essential (primary) hypertension: Secondary | ICD-10-CM | POA: Diagnosis not present

## 2018-10-16 DIAGNOSIS — I2510 Atherosclerotic heart disease of native coronary artery without angina pectoris: Secondary | ICD-10-CM | POA: Diagnosis not present

## 2018-10-16 DIAGNOSIS — I878 Other specified disorders of veins: Secondary | ICD-10-CM | POA: Diagnosis not present

## 2018-11-14 DIAGNOSIS — Z6836 Body mass index (BMI) 36.0-36.9, adult: Secondary | ICD-10-CM | POA: Diagnosis not present

## 2018-11-14 DIAGNOSIS — I878 Other specified disorders of veins: Secondary | ICD-10-CM | POA: Diagnosis not present

## 2018-11-14 DIAGNOSIS — Z299 Encounter for prophylactic measures, unspecified: Secondary | ICD-10-CM | POA: Diagnosis not present

## 2018-11-14 DIAGNOSIS — F17210 Nicotine dependence, cigarettes, uncomplicated: Secondary | ICD-10-CM | POA: Diagnosis not present

## 2018-11-14 DIAGNOSIS — M549 Dorsalgia, unspecified: Secondary | ICD-10-CM | POA: Diagnosis not present

## 2018-12-13 DIAGNOSIS — Z6836 Body mass index (BMI) 36.0-36.9, adult: Secondary | ICD-10-CM | POA: Diagnosis not present

## 2018-12-13 DIAGNOSIS — I2510 Atherosclerotic heart disease of native coronary artery without angina pectoris: Secondary | ICD-10-CM | POA: Diagnosis not present

## 2018-12-13 DIAGNOSIS — I10 Essential (primary) hypertension: Secondary | ICD-10-CM | POA: Diagnosis not present

## 2018-12-13 DIAGNOSIS — Z299 Encounter for prophylactic measures, unspecified: Secondary | ICD-10-CM | POA: Diagnosis not present

## 2018-12-13 DIAGNOSIS — M549 Dorsalgia, unspecified: Secondary | ICD-10-CM | POA: Diagnosis not present

## 2018-12-13 DIAGNOSIS — I878 Other specified disorders of veins: Secondary | ICD-10-CM | POA: Diagnosis not present

## 2019-01-14 DIAGNOSIS — Z7189 Other specified counseling: Secondary | ICD-10-CM | POA: Diagnosis not present

## 2019-01-14 DIAGNOSIS — Z125 Encounter for screening for malignant neoplasm of prostate: Secondary | ICD-10-CM | POA: Diagnosis not present

## 2019-01-14 DIAGNOSIS — Z6835 Body mass index (BMI) 35.0-35.9, adult: Secondary | ICD-10-CM | POA: Diagnosis not present

## 2019-01-14 DIAGNOSIS — Z79899 Other long term (current) drug therapy: Secondary | ICD-10-CM | POA: Diagnosis not present

## 2019-01-14 DIAGNOSIS — I10 Essential (primary) hypertension: Secondary | ICD-10-CM | POA: Diagnosis not present

## 2019-01-14 DIAGNOSIS — Z1211 Encounter for screening for malignant neoplasm of colon: Secondary | ICD-10-CM | POA: Diagnosis not present

## 2019-01-14 DIAGNOSIS — R5383 Other fatigue: Secondary | ICD-10-CM | POA: Diagnosis not present

## 2019-01-14 DIAGNOSIS — Z1331 Encounter for screening for depression: Secondary | ICD-10-CM | POA: Diagnosis not present

## 2019-01-14 DIAGNOSIS — Z1339 Encounter for screening examination for other mental health and behavioral disorders: Secondary | ICD-10-CM | POA: Diagnosis not present

## 2019-01-14 DIAGNOSIS — E7800 Pure hypercholesterolemia, unspecified: Secondary | ICD-10-CM | POA: Diagnosis not present

## 2019-01-14 DIAGNOSIS — Z0000 Encounter for general adult medical examination without abnormal findings: Secondary | ICD-10-CM | POA: Diagnosis not present

## 2019-01-14 DIAGNOSIS — Z299 Encounter for prophylactic measures, unspecified: Secondary | ICD-10-CM | POA: Diagnosis not present

## 2019-05-15 DIAGNOSIS — Z713 Dietary counseling and surveillance: Secondary | ICD-10-CM | POA: Diagnosis not present

## 2019-05-15 DIAGNOSIS — F17210 Nicotine dependence, cigarettes, uncomplicated: Secondary | ICD-10-CM | POA: Diagnosis not present

## 2019-05-15 DIAGNOSIS — Z299 Encounter for prophylactic measures, unspecified: Secondary | ICD-10-CM | POA: Diagnosis not present

## 2019-05-15 DIAGNOSIS — M549 Dorsalgia, unspecified: Secondary | ICD-10-CM | POA: Diagnosis not present

## 2019-05-15 DIAGNOSIS — L03113 Cellulitis of right upper limb: Secondary | ICD-10-CM | POA: Diagnosis not present

## 2019-06-13 DIAGNOSIS — M549 Dorsalgia, unspecified: Secondary | ICD-10-CM | POA: Diagnosis not present

## 2019-06-13 DIAGNOSIS — Z299 Encounter for prophylactic measures, unspecified: Secondary | ICD-10-CM | POA: Diagnosis not present

## 2019-06-13 DIAGNOSIS — F329 Major depressive disorder, single episode, unspecified: Secondary | ICD-10-CM | POA: Diagnosis not present

## 2019-06-13 DIAGNOSIS — F17210 Nicotine dependence, cigarettes, uncomplicated: Secondary | ICD-10-CM | POA: Diagnosis not present

## 2019-06-13 DIAGNOSIS — Z6839 Body mass index (BMI) 39.0-39.9, adult: Secondary | ICD-10-CM | POA: Diagnosis not present

## 2019-06-13 DIAGNOSIS — I10 Essential (primary) hypertension: Secondary | ICD-10-CM | POA: Diagnosis not present

## 2019-07-17 DIAGNOSIS — I2510 Atherosclerotic heart disease of native coronary artery without angina pectoris: Secondary | ICD-10-CM | POA: Diagnosis not present

## 2019-07-17 DIAGNOSIS — M549 Dorsalgia, unspecified: Secondary | ICD-10-CM | POA: Diagnosis not present

## 2019-07-17 DIAGNOSIS — Z299 Encounter for prophylactic measures, unspecified: Secondary | ICD-10-CM | POA: Diagnosis not present

## 2019-07-17 DIAGNOSIS — F17210 Nicotine dependence, cigarettes, uncomplicated: Secondary | ICD-10-CM | POA: Diagnosis not present

## 2019-08-15 DIAGNOSIS — F17210 Nicotine dependence, cigarettes, uncomplicated: Secondary | ICD-10-CM | POA: Diagnosis not present

## 2019-08-15 DIAGNOSIS — I10 Essential (primary) hypertension: Secondary | ICD-10-CM | POA: Diagnosis not present

## 2019-08-15 DIAGNOSIS — I2510 Atherosclerotic heart disease of native coronary artery without angina pectoris: Secondary | ICD-10-CM | POA: Diagnosis not present

## 2019-08-15 DIAGNOSIS — M549 Dorsalgia, unspecified: Secondary | ICD-10-CM | POA: Diagnosis not present

## 2019-08-15 DIAGNOSIS — Z299 Encounter for prophylactic measures, unspecified: Secondary | ICD-10-CM | POA: Diagnosis not present

## 2019-09-16 DIAGNOSIS — I480 Paroxysmal atrial fibrillation: Secondary | ICD-10-CM | POA: Diagnosis not present

## 2019-09-17 DIAGNOSIS — R002 Palpitations: Secondary | ICD-10-CM | POA: Diagnosis not present

## 2019-11-05 DIAGNOSIS — R29818 Other symptoms and signs involving the nervous system: Secondary | ICD-10-CM | POA: Insufficient documentation

## 2019-11-05 DIAGNOSIS — I10 Essential (primary) hypertension: Secondary | ICD-10-CM | POA: Diagnosis not present

## 2019-11-05 DIAGNOSIS — I484 Atypical atrial flutter: Secondary | ICD-10-CM | POA: Diagnosis not present

## 2019-11-20 DIAGNOSIS — R29818 Other symptoms and signs involving the nervous system: Secondary | ICD-10-CM | POA: Diagnosis not present

## 2019-11-20 DIAGNOSIS — I484 Atypical atrial flutter: Secondary | ICD-10-CM | POA: Diagnosis not present

## 2019-11-20 DIAGNOSIS — I059 Rheumatic mitral valve disease, unspecified: Secondary | ICD-10-CM | POA: Diagnosis not present

## 2019-11-20 DIAGNOSIS — I10 Essential (primary) hypertension: Secondary | ICD-10-CM | POA: Diagnosis not present

## 2019-11-21 NOTE — Progress Notes (Signed)
Zio patch outpatient extended telemetry 09/16/2019 for 7 days: Minimum heart rate 58, maximum heart rate 128 bpm.  Predominant rhythm sinus rhythm. 1 run of SVT for 9 beats with a maximum rate of 119 bpm.  Occasional atrial couplets and triplets and isolated PVCs noted.  1 patient triggered event revealed normal sinus rhythm.

## 2019-12-20 ENCOUNTER — Encounter: Payer: Self-pay | Admitting: Cardiology

## 2019-12-20 ENCOUNTER — Ambulatory Visit: Payer: Medicare Other | Admitting: Cardiology

## 2019-12-20 ENCOUNTER — Other Ambulatory Visit: Payer: Self-pay

## 2019-12-20 VITALS — BP 134/86 | HR 74 | Resp 16 | Ht 69.0 in | Wt 246.0 lb

## 2019-12-20 VITALS — BP 134/86 | HR 70 | Resp 16 | Ht 69.0 in | Wt 246.0 lb

## 2019-12-20 DIAGNOSIS — I498 Other specified cardiac arrhythmias: Secondary | ICD-10-CM

## 2019-12-20 DIAGNOSIS — I483 Typical atrial flutter: Secondary | ICD-10-CM | POA: Diagnosis not present

## 2019-12-20 DIAGNOSIS — I471 Supraventricular tachycardia, unspecified: Secondary | ICD-10-CM

## 2019-12-20 DIAGNOSIS — Z720 Tobacco use: Secondary | ICD-10-CM | POA: Diagnosis not present

## 2019-12-20 DIAGNOSIS — I739 Peripheral vascular disease, unspecified: Secondary | ICD-10-CM

## 2019-12-20 DIAGNOSIS — Z4509 Encounter for adjustment and management of other cardiac device: Secondary | ICD-10-CM | POA: Diagnosis not present

## 2019-12-20 DIAGNOSIS — Z95818 Presence of other cardiac implants and grafts: Secondary | ICD-10-CM | POA: Diagnosis not present

## 2019-12-20 NOTE — Patient Instructions (Signed)
Implantable Loop Recorder Placement, Care After This sheet gives you information about how to care for yourself after your procedure. Your health care provider may also give you more specific instructions. If you have problems or questions, contact your health care provider. What can I expect after the procedure? After the procedure, it is common to have:  Soreness or discomfort near the incision.  Some swelling or bruising near the incision. Follow these instructions at home: Incision care   Follow instructions from your health care provider about how to take care of your incision. Make sure you: ? Wash your hands with soap and water before you change your bandage (dressing). If soap and water are not available, use hand sanitizer. ? Change your dressing as told by your health care provider. ? Keep your dressing dry. ? Leave stitches (sutures), skin glue, or adhesive strips in place. These skin closures may need to stay in place for 2 weeks or longer. If adhesive strip edges start to loosen and curl up, you may trim the loose edges. Do not remove adhesive strips completely unless your health care provider tells you to do that.  Check your incision area every day for signs of infection. Check for: ? Redness, swelling, or pain. ? Fluid or blood. ? Warmth. ? Pus or a bad smell.  Do not take baths, swim, or use a hot tub until your health care provider approves. Ask your health care provider if you can take showers. Activity   Return to your normal activities as told by your health care provider. Ask your health care provider what activities are safe for you.  Do not drive for 24 hours if you were given a sedative during your procedure. General instructions  Follow instructions from your health care provider about how to manage your implantable loop recorder and transmit the information. Learn how to activate a recording if this is necessary for your type of device.  Do not go through  a metal detection gate, and do not let someone hold a metal detector over your chest. Show your ID card.  Do not have an MRI unless you check with your health care provider first.  Take over-the-counter and prescription medicines only as told by your health care provider.  Keep all follow-up visits as told by your health care provider. This is important. Contact a health care provider if:  You have redness, swelling, or pain around your incision.  You have a fever.  You have pain that is not relieved by your pain medicine.  You have triggered your device because of fainting (syncope) or because of a heartbeat that feels like it is racing, slow, fluttering, or skipping (palpitations). Get help right away if you have:  Chest pain.  Difficulty breathing. Summary  After the procedure, it is common to have soreness or discomfort near the incision.  Change your dressing as told by your health care provider.  Follow instructions from your health care provider about how to manage your implantable loop recorder and transmit the information.  Keep all follow-up visits as told by your health care provider. This is important. This information is not intended to replace advice given to you by your health care provider. Make sure you discuss any questions you have with your health care provider. Document Revised: 04/29/2017 Document Reviewed: 04/29/2017 Elsevier Patient Education  2020 [employer].

## 2019-12-20 NOTE — Progress Notes (Signed)
  Prodedure performed: Insertion of Biotronik Loop Recorder. Serial # [id number], REF [id number]  Indication: Atrial arrhythmia, atrial flutter, SVT  Blood pressure 134/86, pulse 70, resp. rate 16, height 5\' 9"  (1.753 m), weight 246 lb (111.6 kg), SpO2 98 %.   After obtaining informed consent, explaining the procedure to the patient, under sterile precautions, local anesthesia with 1% lidocaine with epinephrine was injected subcutaneously in the left parasternal region.  20 mL utilized.  A small nick was made in the left paraspinal region at the 3rd intercostal space. Biotronik loop recorder was inserted without any complications.  Patient tolerated the procedure well.  The incision was closed with Steri-Strips.  There is no blood loss, no hematoma.  Written instrtuctions regarding wound care given to the patient.  Device setting:  R wave amplitude 0.44mV.  Programmed to AF detection to Medium.  HVR 160/min. Bradycardia 30 BPM Asystole 5 Sec.    ICD-10-CM   1. Typical atrial flutter (HCC)  I48.3   2. PSVT (paroxysmal supraventricular tachycardia) (HCC)  I47.1   3. Status post placement of implantable loop recorder  Z95.818   4. Encounter for loop recorder check  Z45.09       Recommendation: Steri-Strips have been applied, patient instructions given, he can take off of the Steri-Strips in about a week.  He will follow up with his local cardiologist for wound check in a week.  We will continue to monitor him remotely.   Adrian Prows, MD, [facility] 12/20/2019, 9:49 AM Office: [phone]
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
## 2020-01-12 ENCOUNTER — Telehealth: Payer: Self-pay | Admitting: Cardiology

## 2020-01-12 NOTE — Telephone Encounter (Signed)
Remote loop recorder check 01/01/2020: Represents unremarkable loop recorder evaluation. Continue to monitor. No A. Fib or SVT.

## 2020-01-13 NOTE — Telephone Encounter (Signed)
I called patient number on file. A male answered and said I had the wrong number.

## 2020-01-16 DIAGNOSIS — R5383 Other fatigue: Secondary | ICD-10-CM | POA: Diagnosis not present

## 2020-01-16 DIAGNOSIS — Z0000 Encounter for general adult medical examination without abnormal findings: Secondary | ICD-10-CM | POA: Diagnosis not present

## 2020-01-16 DIAGNOSIS — Z299 Encounter for prophylactic measures, unspecified: Secondary | ICD-10-CM | POA: Diagnosis not present

## 2020-01-16 DIAGNOSIS — Z1331 Encounter for screening for depression: Secondary | ICD-10-CM | POA: Diagnosis not present

## 2020-01-16 DIAGNOSIS — Z1339 Encounter for screening examination for other mental health and behavioral disorders: Secondary | ICD-10-CM | POA: Diagnosis not present

## 2020-01-16 DIAGNOSIS — Z7189 Other specified counseling: Secondary | ICD-10-CM | POA: Diagnosis not present

## 2020-01-16 DIAGNOSIS — Z79899 Other long term (current) drug therapy: Secondary | ICD-10-CM | POA: Diagnosis not present

## 2020-01-16 DIAGNOSIS — E7800 Pure hypercholesterolemia, unspecified: Secondary | ICD-10-CM | POA: Diagnosis not present

## 2020-01-16 DIAGNOSIS — Z6831 Body mass index (BMI) 31.0-31.9, adult: Secondary | ICD-10-CM | POA: Diagnosis not present

## 2020-01-16 DIAGNOSIS — Z125 Encounter for screening for malignant neoplasm of prostate: Secondary | ICD-10-CM | POA: Diagnosis not present

## 2020-01-20 DIAGNOSIS — Z4509 Encounter for adjustment and management of other cardiac device: Secondary | ICD-10-CM | POA: Diagnosis not present

## 2020-01-20 DIAGNOSIS — I471 Supraventricular tachycardia: Secondary | ICD-10-CM | POA: Diagnosis not present

## 2020-01-20 DIAGNOSIS — I483 Typical atrial flutter: Secondary | ICD-10-CM | POA: Diagnosis not present

## 2020-01-20 DIAGNOSIS — R002 Palpitations: Secondary | ICD-10-CM | POA: Diagnosis not present

## 2020-01-20 DIAGNOSIS — Z95818 Presence of other cardiac implants and grafts: Secondary | ICD-10-CM | POA: Diagnosis not present

## 2020-01-23 DIAGNOSIS — N183 Chronic kidney disease, stage 3 unspecified: Secondary | ICD-10-CM | POA: Diagnosis not present

## 2020-01-23 DIAGNOSIS — Z299 Encounter for prophylactic measures, unspecified: Secondary | ICD-10-CM | POA: Diagnosis not present

## 2020-01-23 DIAGNOSIS — F329 Major depressive disorder, single episode, unspecified: Secondary | ICD-10-CM | POA: Diagnosis not present

## 2020-01-23 DIAGNOSIS — Z6832 Body mass index (BMI) 32.0-32.9, adult: Secondary | ICD-10-CM | POA: Diagnosis not present

## 2020-02-06 ENCOUNTER — Telehealth: Payer: Self-pay | Admitting: Cardiology

## 2020-02-06 DIAGNOSIS — R29818 Other symptoms and signs involving the nervous system: Secondary | ICD-10-CM | POA: Diagnosis not present

## 2020-02-06 DIAGNOSIS — I10 Essential (primary) hypertension: Secondary | ICD-10-CM | POA: Diagnosis not present

## 2020-02-06 DIAGNOSIS — I484 Atypical atrial flutter: Secondary | ICD-10-CM | POA: Diagnosis not present

## 2020-02-06 DIAGNOSIS — N183 Chronic kidney disease, stage 3 unspecified: Secondary | ICD-10-CM | POA: Diagnosis not present

## 2020-02-10 NOTE — Telephone Encounter (Signed)
Called and spoke with patient regarding his remote loop recorder check results.

## 2020-02-12 DIAGNOSIS — E7800 Pure hypercholesterolemia, unspecified: Secondary | ICD-10-CM | POA: Diagnosis not present

## 2020-02-12 DIAGNOSIS — E119 Type 2 diabetes mellitus without complications: Secondary | ICD-10-CM | POA: Diagnosis not present

## 2020-02-12 DIAGNOSIS — T148XXA Other injury of unspecified body region, initial encounter: Secondary | ICD-10-CM | POA: Diagnosis not present

## 2020-02-12 DIAGNOSIS — Z2821 Immunization not carried out because of patient refusal: Secondary | ICD-10-CM | POA: Diagnosis not present

## 2020-02-12 DIAGNOSIS — Z6834 Body mass index (BMI) 34.0-34.9, adult: Secondary | ICD-10-CM | POA: Diagnosis not present

## 2020-02-12 DIAGNOSIS — Z299 Encounter for prophylactic measures, unspecified: Secondary | ICD-10-CM | POA: Diagnosis not present

## 2020-02-12 DIAGNOSIS — E1165 Type 2 diabetes mellitus with hyperglycemia: Secondary | ICD-10-CM | POA: Diagnosis not present

## 2020-02-12 DIAGNOSIS — I10 Essential (primary) hypertension: Secondary | ICD-10-CM | POA: Diagnosis not present

## 2020-02-12 DIAGNOSIS — M549 Dorsalgia, unspecified: Secondary | ICD-10-CM | POA: Diagnosis not present

## 2020-02-20 DIAGNOSIS — Z95818 Presence of other cardiac implants and grafts: Secondary | ICD-10-CM | POA: Diagnosis not present

## 2020-02-20 DIAGNOSIS — I483 Typical atrial flutter: Secondary | ICD-10-CM | POA: Diagnosis not present

## 2020-02-20 DIAGNOSIS — I471 Supraventricular tachycardia: Secondary | ICD-10-CM | POA: Diagnosis not present

## 2020-02-20 DIAGNOSIS — R002 Palpitations: Secondary | ICD-10-CM | POA: Diagnosis not present

## 2020-02-20 DIAGNOSIS — Z4509 Encounter for adjustment and management of other cardiac device: Secondary | ICD-10-CM | POA: Diagnosis not present

## 2020-03-13 DIAGNOSIS — M549 Dorsalgia, unspecified: Secondary | ICD-10-CM | POA: Diagnosis not present

## 2020-03-13 DIAGNOSIS — I10 Essential (primary) hypertension: Secondary | ICD-10-CM | POA: Diagnosis not present

## 2020-03-13 DIAGNOSIS — E1165 Type 2 diabetes mellitus with hyperglycemia: Secondary | ICD-10-CM | POA: Diagnosis not present

## 2020-03-13 DIAGNOSIS — G8194 Hemiplegia, unspecified affecting left nondominant side: Secondary | ICD-10-CM | POA: Diagnosis not present

## 2020-03-13 DIAGNOSIS — Z79899 Other long term (current) drug therapy: Secondary | ICD-10-CM | POA: Diagnosis not present

## 2020-03-13 DIAGNOSIS — Z299 Encounter for prophylactic measures, unspecified: Secondary | ICD-10-CM | POA: Diagnosis not present

## 2020-03-13 DIAGNOSIS — F17210 Nicotine dependence, cigarettes, uncomplicated: Secondary | ICD-10-CM | POA: Diagnosis not present

## 2020-03-13 DIAGNOSIS — Z6834 Body mass index (BMI) 34.0-34.9, adult: Secondary | ICD-10-CM | POA: Diagnosis not present

## 2020-03-15 ENCOUNTER — Telehealth: Payer: Self-pay | Admitting: Cardiology

## 2020-03-15 NOTE — Telephone Encounter (Signed)
Remote loop recorder check 03/03/2020:  Presenting sinus with IVCD. No symptoms. No AF or SVT episodes.  No symptomatic or automatic episodes recorded.   Adrian Prows, MD, [facility] 03/15/2020, 3:55 PM Office: [phone] Pager: [phone]

## 2020-03-17 NOTE — Telephone Encounter (Signed)
Called and spoke with patient regarding his remote loop recorder results.

## 2020-03-18 DIAGNOSIS — M545 Low back pain, unspecified: Secondary | ICD-10-CM | POA: Diagnosis not present

## 2020-03-18 DIAGNOSIS — Z043 Encounter for examination and observation following other accident: Secondary | ICD-10-CM | POA: Diagnosis not present

## 2020-03-18 DIAGNOSIS — F17200 Nicotine dependence, unspecified, uncomplicated: Secondary | ICD-10-CM | POA: Diagnosis not present

## 2020-03-18 DIAGNOSIS — G8929 Other chronic pain: Secondary | ICD-10-CM | POA: Diagnosis not present

## 2020-03-18 DIAGNOSIS — I10 Essential (primary) hypertension: Secondary | ICD-10-CM | POA: Diagnosis not present

## 2020-03-22 DIAGNOSIS — Z4509 Encounter for adjustment and management of other cardiac device: Secondary | ICD-10-CM | POA: Diagnosis not present

## 2020-03-22 DIAGNOSIS — I483 Typical atrial flutter: Secondary | ICD-10-CM | POA: Diagnosis not present

## 2020-03-22 DIAGNOSIS — I471 Supraventricular tachycardia: Secondary | ICD-10-CM | POA: Diagnosis not present

## 2020-03-22 DIAGNOSIS — R002 Palpitations: Secondary | ICD-10-CM | POA: Diagnosis not present

## 2020-03-22 DIAGNOSIS — Z95818 Presence of other cardiac implants and grafts: Secondary | ICD-10-CM | POA: Diagnosis not present

## 2020-04-09 ENCOUNTER — Telehealth: Payer: Self-pay | Admitting: Cardiology

## 2020-04-09 NOTE — Telephone Encounter (Signed)
Number on file disconnected.

## 2020-04-22 DIAGNOSIS — R002 Palpitations: Secondary | ICD-10-CM | POA: Diagnosis not present

## 2020-04-22 DIAGNOSIS — I471 Supraventricular tachycardia: Secondary | ICD-10-CM | POA: Diagnosis not present

## 2020-04-22 DIAGNOSIS — Z4509 Encounter for adjustment and management of other cardiac device: Secondary | ICD-10-CM | POA: Diagnosis not present

## 2020-04-22 DIAGNOSIS — I483 Typical atrial flutter: Secondary | ICD-10-CM | POA: Diagnosis not present

## 2020-04-22 DIAGNOSIS — Z95818 Presence of other cardiac implants and grafts: Secondary | ICD-10-CM | POA: Diagnosis not present

## 2020-04-24 DIAGNOSIS — I10 Essential (primary) hypertension: Secondary | ICD-10-CM | POA: Diagnosis not present

## 2020-04-24 DIAGNOSIS — E1165 Type 2 diabetes mellitus with hyperglycemia: Secondary | ICD-10-CM | POA: Diagnosis not present

## 2020-04-24 DIAGNOSIS — F17210 Nicotine dependence, cigarettes, uncomplicated: Secondary | ICD-10-CM | POA: Diagnosis not present

## 2020-04-24 DIAGNOSIS — M549 Dorsalgia, unspecified: Secondary | ICD-10-CM | POA: Diagnosis not present

## 2020-04-24 DIAGNOSIS — Z299 Encounter for prophylactic measures, unspecified: Secondary | ICD-10-CM | POA: Diagnosis not present

## 2020-05-04 DIAGNOSIS — E11319 Type 2 diabetes mellitus with unspecified diabetic retinopathy without macular edema: Secondary | ICD-10-CM | POA: Diagnosis not present

## 2020-05-04 DIAGNOSIS — H5213 Myopia, bilateral: Secondary | ICD-10-CM | POA: Diagnosis not present

## 2020-05-25 DIAGNOSIS — I10 Essential (primary) hypertension: Secondary | ICD-10-CM | POA: Diagnosis not present

## 2020-05-25 DIAGNOSIS — Z299 Encounter for prophylactic measures, unspecified: Secondary | ICD-10-CM | POA: Diagnosis not present

## 2020-05-25 DIAGNOSIS — F17210 Nicotine dependence, cigarettes, uncomplicated: Secondary | ICD-10-CM | POA: Diagnosis not present

## 2020-05-25 DIAGNOSIS — M549 Dorsalgia, unspecified: Secondary | ICD-10-CM | POA: Diagnosis not present

## 2020-05-25 DIAGNOSIS — E1022 Type 1 diabetes mellitus with diabetic chronic kidney disease: Secondary | ICD-10-CM | POA: Diagnosis not present

## 2020-05-25 DIAGNOSIS — E1165 Type 2 diabetes mellitus with hyperglycemia: Secondary | ICD-10-CM | POA: Diagnosis not present

## 2020-05-26 DIAGNOSIS — H2513 Age-related nuclear cataract, bilateral: Secondary | ICD-10-CM | POA: Diagnosis not present

## 2020-06-17 DIAGNOSIS — Z8639 Personal history of other endocrine, nutritional and metabolic disease: Secondary | ICD-10-CM | POA: Diagnosis not present

## 2020-06-17 DIAGNOSIS — L84 Corns and callosities: Secondary | ICD-10-CM | POA: Diagnosis not present

## 2020-06-17 DIAGNOSIS — E1165 Type 2 diabetes mellitus with hyperglycemia: Secondary | ICD-10-CM | POA: Diagnosis not present

## 2020-06-19 ENCOUNTER — Ambulatory Visit: Payer: Medicare Other | Admitting: Podiatry

## 2020-06-22 DIAGNOSIS — R030 Elevated blood-pressure reading, without diagnosis of hypertension: Secondary | ICD-10-CM | POA: Diagnosis not present

## 2020-06-22 DIAGNOSIS — E1165 Type 2 diabetes mellitus with hyperglycemia: Secondary | ICD-10-CM | POA: Diagnosis not present

## 2020-06-22 DIAGNOSIS — F32A Depression, unspecified: Secondary | ICD-10-CM | POA: Diagnosis not present

## 2020-06-22 DIAGNOSIS — I10 Essential (primary) hypertension: Secondary | ICD-10-CM | POA: Diagnosis not present

## 2020-06-22 DIAGNOSIS — L089 Local infection of the skin and subcutaneous tissue, unspecified: Secondary | ICD-10-CM | POA: Diagnosis not present

## 2020-06-22 DIAGNOSIS — F17210 Nicotine dependence, cigarettes, uncomplicated: Secondary | ICD-10-CM | POA: Diagnosis not present

## 2020-06-22 DIAGNOSIS — Z299 Encounter for prophylactic measures, unspecified: Secondary | ICD-10-CM | POA: Diagnosis not present

## 2020-06-25 ENCOUNTER — Telehealth: Payer: Self-pay | Admitting: Cardiology

## 2020-06-25 NOTE — Telephone Encounter (Signed)
Home number is incorrect, and other 2 numbers on file are disconnected. I will send him a letter to contact us.

## 2020-07-02 ENCOUNTER — Other Ambulatory Visit: Payer: Self-pay

## 2020-07-02 ENCOUNTER — Ambulatory Visit (INDEPENDENT_AMBULATORY_CARE_PROVIDER_SITE_OTHER): Payer: Medicare Other | Admitting: Podiatry

## 2020-07-02 DIAGNOSIS — M2041 Other hammer toe(s) (acquired), right foot: Secondary | ICD-10-CM

## 2020-07-02 DIAGNOSIS — M79674 Pain in right toe(s): Secondary | ICD-10-CM

## 2020-07-02 DIAGNOSIS — E1165 Type 2 diabetes mellitus with hyperglycemia: Secondary | ICD-10-CM

## 2020-07-02 DIAGNOSIS — E1142 Type 2 diabetes mellitus with diabetic polyneuropathy: Secondary | ICD-10-CM | POA: Diagnosis not present

## 2020-07-02 DIAGNOSIS — B351 Tinea unguium: Secondary | ICD-10-CM

## 2020-07-02 DIAGNOSIS — Z89422 Acquired absence of other left toe(s): Secondary | ICD-10-CM | POA: Diagnosis not present

## 2020-07-02 DIAGNOSIS — M2042 Other hammer toe(s) (acquired), left foot: Secondary | ICD-10-CM | POA: Diagnosis not present

## 2020-07-02 DIAGNOSIS — M79675 Pain in left toe(s): Secondary | ICD-10-CM | POA: Diagnosis not present

## 2020-07-02 NOTE — Patient Instructions (Addendum)
Diabetes Mellitus and Foot Care Foot care is an important part of your health, especially when you have diabetes. Diabetes may cause you to have problems because of poor blood flow (circulation) to your feet and legs, which can cause your skin to:  Become thinner and drier.  Break more easily.  Heal more slowly.  Peel and crack. You may also have nerve damage (neuropathy) in your legs and feet, causing decreased feeling in them. This means that you may not notice minor injuries to your feet that could lead to more serious problems. Noticing and addressing any potential problems early is the best way to prevent future foot problems. How to care for your feet Foot hygiene  Wash your feet daily with warm water and mild soap. Do not use hot water. Then, pat your feet and the areas between your toes until they are completely dry. Do not soak your feet as this can dry your skin.  Trim your toenails straight across. Do not dig under them or around the cuticle. File the edges of your nails with an emery board or nail file.  Apply a moisturizing lotion or petroleum jelly to the skin on your feet and to dry, brittle toenails. Use lotion that does not contain alcohol and is unscented. Do not apply lotion between your toes.   Shoes and socks  Wear clean socks or stockings every day. Make sure they are not too tight. Do not wear knee-high stockings since they may decrease blood flow to your legs.  Wear shoes that fit properly and have enough cushioning. Always look in your shoes before you put them on to be sure there are no objects inside.  To break in new shoes, wear them for just a few hours a day. This prevents injuries on your feet. Wounds, scrapes, corns, and calluses  Check your feet daily for blisters, cuts, bruises, sores, and redness. If you cannot see the bottom of your feet, use a mirror or ask someone for help.  Do not cut corns or calluses or try to remove them with medicine.  If you  find a minor scrape, cut, or break in the skin on your feet, keep it and the skin around it clean and dry. You may clean these areas with mild soap and water. Do not clean the area with peroxide, alcohol, or iodine.  If you have a wound, scrape, corn, or callus on your foot, look at it several times a day to make sure it is healing and not infected. Check for: ? Redness, swelling, or pain. ? Fluid or blood. ? Warmth. ? Pus or a bad smell.   General tips  Do not cross your legs. This may decrease blood flow to your feet.  Do not use heating pads or hot water bottles on your feet. They may burn your skin. If you have lost feeling in your feet or legs, you may not know this is happening until it is too late.  Protect your feet from hot and cold by wearing shoes, such as at the beach or on hot pavement.  Schedule a complete foot exam at least once a year (annually) or more often if you have foot problems. Report any cuts, sores, or bruises to your health care provider immediately. Where to find more information  American Diabetes Association: www.diabetes.org  Association of Diabetes Care & Education Specialists: www.diabeteseducator.org Contact a health care provider if:  You have a medical condition that increases your risk of infection and   you have any cuts, sores, or bruises on your feet.  You have an injury that is not healing.  You have redness on your legs or feet.  You feel burning or tingling in your legs or feet.  You have pain or cramps in your legs and feet.  Your legs or feet are numb.  Your feet always feel cold.  You have pain around any toenails. Get help right away if:  You have a wound, scrape, corn, or callus on your foot and: ? You have pain, swelling, or redness that gets worse. ? You have fluid or blood coming from the wound, scrape, corn, or callus. ? Your wound, scrape, corn, or callus feels warm to the touch. ? You have pus or a bad smell coming from  the wound, scrape, corn, or callus. ? You have a fever. ? You have a red line going up your leg. Summary  Check your feet every day for blisters, cuts, bruises, sores, and redness.  Apply a moisturizing lotion or petroleum jelly to the skin on your feet and to dry, brittle toenails.  Wear shoes that fit properly and have enough cushioning.  If you have foot problems, report any cuts, sores, or bruises to your health care provider immediately.  Schedule a complete foot exam at least once a year (annually) or more often if you have foot problems. This information is not intended to replace advice given to you by your health care provider. Make sure you discuss any questions you have with your health care provider. Document Revised: 10/03/2019 Document Reviewed: 10/03/2019 Elsevier Patient Education  2021 Elsevier Inc.  

## 2020-07-03 ENCOUNTER — Encounter: Payer: Self-pay | Admitting: Podiatry

## 2020-07-03 NOTE — Progress Notes (Signed)
  Subjective:  Patient ID: Michael Chambers, male    DOB: 1960-05-13,  MRN: [id number]  Chief Complaint  Patient presents with  . Diabetes    60 y.o. male presents with the above complaint. History confirmed with patient. He presents today with a medical advocate.  He has a history of a fifth toe indication from prior gangrene.  He has a history of type 2 diabetes and peripheral neuropathy.  His nails are thick and elongated and is in need of diabetic foot care and examination.  He also requests he has diabetic shoes fitted and made  Objective:  Physical Exam: warm, good capillary refill, no trophic changes or ulcerative lesions and normal DP and PT pulses. absent protective sensation both feet, previous fifth toe amputation left foot, he has onychomycosis with elongated toenails 10 an yellow subungual debris Assessment:   1. Hammertoe of left foot   2. Hammertoe of right foot   3. Type 2 diabetes mellitus with hyperglycemia, unspecified whether long term insulin use ([place])   4. Diabetic polyneuropathy associated with type 2 diabetes mellitus (HCC)   5. Pain due to onychomycosis of toenails of both feet   6. History of amputation of lesser toe of left foot ([place])      Plan:  Patient was evaluated and treated and all questions answered.  Patient educated on diabetes. Discussed proper diabetic foot care and discussed risks and complications of disease. Educated patient in depth on reasons to return to the office immediately should he/she discover anything concerning or new on the feet. All questions answered. Discussed proper shoes as well.   He'll be fitted for diabetic shoes and multiple density, insoles  Discussed the etiology and treatment options for the condition in detail with the patient. Educated patient on the topical and oral treatment options for mycotic nails. Recommended debridement of the nails today. Sharp and mechanical debridement performed of all painful and mycotic nails  today. Nails debrided in length and thickness using a nail nipper to level of comfort. Discussed treatment options including appropriate shoe gear. Follow up as needed for painful nails.   Return in about 3 months (around 10/01/2020) for at risk diabetic foot care.

## 2020-07-08 ENCOUNTER — Telehealth: Payer: Self-pay | Admitting: Podiatry

## 2020-07-08 NOTE — Telephone Encounter (Signed)
Called patient to set up appointment for the casting of diabetic shoes, unable to lvm wasn't set up yet

## 2020-07-13 ENCOUNTER — Telehealth: Payer: Self-pay | Admitting: Podiatry

## 2020-07-13 NOTE — Telephone Encounter (Signed)
Received a voicemail from a [employer] stating she was pts transportation and needed to change his appt for 4.20.22 as she cannot bring him that day.  I called pt to confirm it was ok to talk with her and he said yes. I called her number multiple times and it just hangs up. I called pt again and asked pt to have her call us to r/s as when I call it just hangs up does not ring at all.

## 2020-07-15 ENCOUNTER — Telehealth: Payer: Self-pay | Admitting: Podiatry

## 2020-07-15 ENCOUNTER — Other Ambulatory Visit: Payer: Medicare Other

## 2020-07-15 NOTE — Telephone Encounter (Signed)
Received voicemail from a male Michael Chambers (Pt gave ok to talk with her) she is pts transportation asking for a call back to get pt scheduled for diabetic shoe measurements,   I returned the call again several times and it does not ring just hangs up. I have tried from multiple phones in the office as well. I finally used my personal cell and left message for pts transportation that I have tried calling from several different phones and it just hangs up no ringing or anything for a couple of days now.I also call pts number and he does not have voicemail set up. I told her I have pt scheduled for 4.26 @ 1115 for the diabetic shoe measurements that we did not have the dates she requested available.  I told her I would be out of the office the Thursday and Friday but to call the mail office to change appt if this appt does not work.

## 2020-07-21 ENCOUNTER — Other Ambulatory Visit: Payer: Medicare Other

## 2020-07-24 DIAGNOSIS — E1165 Type 2 diabetes mellitus with hyperglycemia: Secondary | ICD-10-CM | POA: Diagnosis not present

## 2020-07-24 DIAGNOSIS — Z299 Encounter for prophylactic measures, unspecified: Secondary | ICD-10-CM | POA: Diagnosis not present

## 2020-07-24 DIAGNOSIS — M549 Dorsalgia, unspecified: Secondary | ICD-10-CM | POA: Diagnosis not present

## 2020-07-24 DIAGNOSIS — Z79899 Other long term (current) drug therapy: Secondary | ICD-10-CM | POA: Diagnosis not present

## 2020-07-24 DIAGNOSIS — I2510 Atherosclerotic heart disease of native coronary artery without angina pectoris: Secondary | ICD-10-CM | POA: Diagnosis not present

## 2020-07-24 DIAGNOSIS — R52 Pain, unspecified: Secondary | ICD-10-CM | POA: Diagnosis not present

## 2020-07-24 DIAGNOSIS — I10 Essential (primary) hypertension: Secondary | ICD-10-CM | POA: Diagnosis not present

## 2020-07-27 ENCOUNTER — Other Ambulatory Visit: Payer: Self-pay

## 2020-07-27 ENCOUNTER — Ambulatory Visit (INDEPENDENT_AMBULATORY_CARE_PROVIDER_SITE_OTHER): Payer: Medicare Other | Admitting: *Deleted

## 2020-07-27 DIAGNOSIS — E1165 Type 2 diabetes mellitus with hyperglycemia: Secondary | ICD-10-CM

## 2020-07-27 DIAGNOSIS — M2042 Other hammer toe(s) (acquired), left foot: Secondary | ICD-10-CM

## 2020-07-27 DIAGNOSIS — Z89422 Acquired absence of other left toe(s): Secondary | ICD-10-CM

## 2020-07-27 DIAGNOSIS — M2041 Other hammer toe(s) (acquired), right foot: Secondary | ICD-10-CM

## 2020-07-27 NOTE — Progress Notes (Signed)
Patient presents to the office today for diabetic shoe and insole measuring.  Patient was measured with brannock device to determine size and width for 1 pair of extra depth shoes and foam casted for 3 pair of insoles.   Documentation of medical necessity will be sent to patient's treating diabetic doctor to verify and sign.   Patient's diabetic provider: Dr. Monico Blitz  Shoes and insoles will be ordered at that time and patient will be notified for an appointment for fitting when they arrive.   Shoe size (per patient): 11   Brannock measurement: 9 RIGHT, 9.5 LEFT (E)  Patient shoe selection-   1st choice:   Apex [id number]   2nd choice:  Apex [id number]  Shoe size ordered: Men's 10 X-Wide

## 2020-08-03 ENCOUNTER — Ambulatory Visit: Payer: Medicare Other | Admitting: Cardiology

## 2020-08-05 NOTE — Telephone Encounter (Signed)
Patient's phone is back on, he stated that he has not been transmitting because he said that you told him he needed to have it removed and no longer needed to transmit. Please advise.

## 2020-08-06 DIAGNOSIS — I10 Essential (primary) hypertension: Secondary | ICD-10-CM | POA: Diagnosis not present

## 2020-08-06 DIAGNOSIS — I498 Other specified cardiac arrhythmias: Secondary | ICD-10-CM | POA: Diagnosis not present

## 2020-08-06 DIAGNOSIS — E782 Mixed hyperlipidemia: Secondary | ICD-10-CM | POA: Diagnosis not present

## 2020-08-06 DIAGNOSIS — R0600 Dyspnea, unspecified: Secondary | ICD-10-CM | POA: Diagnosis not present

## 2020-08-06 DIAGNOSIS — R9431 Abnormal electrocardiogram [ECG] [EKG]: Secondary | ICD-10-CM | POA: Insufficient documentation

## 2020-08-06 DIAGNOSIS — R7303 Prediabetes: Secondary | ICD-10-CM | POA: Insufficient documentation

## 2020-08-13 ENCOUNTER — Telehealth: Payer: Self-pay

## 2020-08-13 NOTE — Telephone Encounter (Signed)
Patient sister called back. Spoke with Lolita Patella. Please see chart notes.

## 2020-08-13 NOTE — Telephone Encounter (Signed)
I have tried several times to contact patient and his sister to have patient give [place] a call back. I have not had any success so far. I have spoken with Melinda Martinique @ [facility] and she also has been trying to contact patient after he told her he was having Loop removed in July 2022.

## 2020-08-17 NOTE — Telephone Encounter (Signed)
Patient has not been transferring his loop recorder.  We have been in touch with the patient's sister, patient has no access to any form of Internet or electrical connectivity.  Once he gets his home situation updated, will start transmitting.  Discussed with his primary cardiologist, at least he has a loop recorder that can be interrogated if he were to present to the emergency room.   Adrian Prows, MD, [facility] 08/17/2020, 8:04 AM Office: [phone] Fax: [phone] Pager: [phone]

## 2020-08-25 DIAGNOSIS — E1165 Type 2 diabetes mellitus with hyperglycemia: Secondary | ICD-10-CM | POA: Diagnosis not present

## 2020-08-25 DIAGNOSIS — I10 Essential (primary) hypertension: Secondary | ICD-10-CM | POA: Diagnosis not present

## 2020-08-25 DIAGNOSIS — E1122 Type 2 diabetes mellitus with diabetic chronic kidney disease: Secondary | ICD-10-CM | POA: Diagnosis not present

## 2020-08-25 DIAGNOSIS — F17210 Nicotine dependence, cigarettes, uncomplicated: Secondary | ICD-10-CM | POA: Diagnosis not present

## 2020-08-25 DIAGNOSIS — M549 Dorsalgia, unspecified: Secondary | ICD-10-CM | POA: Diagnosis not present

## 2020-08-25 DIAGNOSIS — Z299 Encounter for prophylactic measures, unspecified: Secondary | ICD-10-CM | POA: Diagnosis not present

## 2020-09-09 NOTE — Telephone Encounter (Signed)
I discussed with his primary cardiologist.  Advised him that he can certainly have an office visit for loop recorder check.  Although patient is not transmitting, hopefully if he has any episodes of TIA-like symptoms or palpitations, device can be interrogated.  Unable to contact the patient.

## 2020-09-24 DIAGNOSIS — Z299 Encounter for prophylactic measures, unspecified: Secondary | ICD-10-CM | POA: Diagnosis not present

## 2020-09-24 DIAGNOSIS — I498 Other specified cardiac arrhythmias: Secondary | ICD-10-CM | POA: Diagnosis not present

## 2020-09-24 DIAGNOSIS — I700 Atherosclerosis of aorta: Secondary | ICD-10-CM | POA: Diagnosis not present

## 2020-09-24 DIAGNOSIS — M549 Dorsalgia, unspecified: Secondary | ICD-10-CM | POA: Diagnosis not present

## 2020-09-24 DIAGNOSIS — I10 Essential (primary) hypertension: Secondary | ICD-10-CM | POA: Diagnosis not present

## 2020-09-25 ENCOUNTER — Ambulatory Visit: Payer: Medicare Other | Admitting: Cardiology

## 2020-09-30 ENCOUNTER — Ambulatory Visit: Payer: Medicare Other | Admitting: Cardiology

## 2020-09-30 ENCOUNTER — Encounter: Payer: Self-pay | Admitting: Cardiology

## 2020-09-30 ENCOUNTER — Other Ambulatory Visit: Payer: Self-pay

## 2020-09-30 VITALS — BP 135/80 | HR 83 | Temp 98.6°F | Resp 17 | Ht 69.0 in | Wt 220.4 lb

## 2020-09-30 DIAGNOSIS — Z720 Tobacco use: Secondary | ICD-10-CM

## 2020-09-30 DIAGNOSIS — R0989 Other specified symptoms and signs involving the circulatory and respiratory systems: Secondary | ICD-10-CM

## 2020-09-30 DIAGNOSIS — Z4509 Encounter for adjustment and management of other cardiac device: Secondary | ICD-10-CM

## 2020-09-30 DIAGNOSIS — I471 Supraventricular tachycardia: Secondary | ICD-10-CM | POA: Diagnosis not present

## 2020-09-30 DIAGNOSIS — I498 Other specified cardiac arrhythmias: Secondary | ICD-10-CM | POA: Diagnosis not present

## 2020-09-30 DIAGNOSIS — Z95818 Presence of other cardiac implants and grafts: Secondary | ICD-10-CM

## 2020-09-30 MED ORDER — ASPIRIN 81 MG PO CHEW: 81.0000 mg | CHEWABLE_TABLET | Freq: Every day | ORAL | Status: DC
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
## 2020-10-07 ENCOUNTER — Ambulatory Visit: Payer: Medicare Other | Admitting: Podiatry

## 2020-10-13 ENCOUNTER — Ambulatory Visit: Payer: Medicare Other | Admitting: Podiatry

## 2020-10-23 DIAGNOSIS — Z299 Encounter for prophylactic measures, unspecified: Secondary | ICD-10-CM | POA: Diagnosis not present

## 2020-10-23 DIAGNOSIS — M549 Dorsalgia, unspecified: Secondary | ICD-10-CM | POA: Diagnosis not present

## 2020-10-23 DIAGNOSIS — I700 Atherosclerosis of aorta: Secondary | ICD-10-CM | POA: Diagnosis not present

## 2020-10-23 DIAGNOSIS — I10 Essential (primary) hypertension: Secondary | ICD-10-CM | POA: Diagnosis not present

## 2020-10-23 DIAGNOSIS — F17210 Nicotine dependence, cigarettes, uncomplicated: Secondary | ICD-10-CM | POA: Diagnosis not present

## 2020-11-05 DIAGNOSIS — I10 Essential (primary) hypertension: Secondary | ICD-10-CM | POA: Diagnosis not present

## 2020-11-25 DIAGNOSIS — E1165 Type 2 diabetes mellitus with hyperglycemia: Secondary | ICD-10-CM | POA: Diagnosis not present

## 2020-11-25 DIAGNOSIS — Z299 Encounter for prophylactic measures, unspecified: Secondary | ICD-10-CM | POA: Diagnosis not present

## 2020-11-25 DIAGNOSIS — E11621 Type 2 diabetes mellitus with foot ulcer: Secondary | ICD-10-CM | POA: Diagnosis not present

## 2020-11-25 DIAGNOSIS — F17210 Nicotine dependence, cigarettes, uncomplicated: Secondary | ICD-10-CM | POA: Diagnosis not present

## 2020-11-25 DIAGNOSIS — M549 Dorsalgia, unspecified: Secondary | ICD-10-CM | POA: Diagnosis not present

## 2020-11-25 DIAGNOSIS — I10 Essential (primary) hypertension: Secondary | ICD-10-CM | POA: Diagnosis not present

## 2020-11-25 DIAGNOSIS — L97509 Non-pressure chronic ulcer of other part of unspecified foot with unspecified severity: Secondary | ICD-10-CM | POA: Diagnosis not present

## 2020-12-25 DIAGNOSIS — I10 Essential (primary) hypertension: Secondary | ICD-10-CM | POA: Diagnosis not present

## 2020-12-25 DIAGNOSIS — M549 Dorsalgia, unspecified: Secondary | ICD-10-CM | POA: Diagnosis not present

## 2020-12-25 DIAGNOSIS — F17210 Nicotine dependence, cigarettes, uncomplicated: Secondary | ICD-10-CM | POA: Diagnosis not present

## 2020-12-25 DIAGNOSIS — D696 Thrombocytopenia, unspecified: Secondary | ICD-10-CM | POA: Diagnosis not present

## 2020-12-25 DIAGNOSIS — Z2821 Immunization not carried out because of patient refusal: Secondary | ICD-10-CM | POA: Diagnosis not present

## 2020-12-25 DIAGNOSIS — Z299 Encounter for prophylactic measures, unspecified: Secondary | ICD-10-CM | POA: Diagnosis not present

## 2021-01-22 DIAGNOSIS — E1022 Type 1 diabetes mellitus with diabetic chronic kidney disease: Secondary | ICD-10-CM | POA: Diagnosis not present

## 2021-01-22 DIAGNOSIS — Z299 Encounter for prophylactic measures, unspecified: Secondary | ICD-10-CM | POA: Diagnosis not present

## 2021-01-22 DIAGNOSIS — I498 Other specified cardiac arrhythmias: Secondary | ICD-10-CM | POA: Diagnosis not present

## 2021-01-22 DIAGNOSIS — M549 Dorsalgia, unspecified: Secondary | ICD-10-CM | POA: Diagnosis not present

## 2021-01-22 DIAGNOSIS — I10 Essential (primary) hypertension: Secondary | ICD-10-CM | POA: Diagnosis not present

## 2021-02-08 ENCOUNTER — Telehealth: Payer: Self-pay | Admitting: Podiatry

## 2021-02-08 NOTE — Telephone Encounter (Signed)
Diabetic shoes/inserts in.. received updated auth.. lvm for pt to call to schedule an appt to pick them up or let me know if he is not wanting to get the shoes.

## 2021-02-22 ENCOUNTER — Encounter (INDEPENDENT_AMBULATORY_CARE_PROVIDER_SITE_OTHER): Payer: Self-pay | Admitting: *Deleted

## 2021-02-22 DIAGNOSIS — M549 Dorsalgia, unspecified: Secondary | ICD-10-CM | POA: Diagnosis not present

## 2021-02-22 DIAGNOSIS — L309 Dermatitis, unspecified: Secondary | ICD-10-CM | POA: Diagnosis not present

## 2021-02-22 DIAGNOSIS — F17210 Nicotine dependence, cigarettes, uncomplicated: Secondary | ICD-10-CM | POA: Diagnosis not present

## 2021-02-22 DIAGNOSIS — Z299 Encounter for prophylactic measures, unspecified: Secondary | ICD-10-CM | POA: Diagnosis not present

## 2021-02-22 DIAGNOSIS — I10 Essential (primary) hypertension: Secondary | ICD-10-CM | POA: Diagnosis not present

## 2021-03-24 DIAGNOSIS — I10 Essential (primary) hypertension: Secondary | ICD-10-CM | POA: Diagnosis not present

## 2021-03-24 DIAGNOSIS — M549 Dorsalgia, unspecified: Secondary | ICD-10-CM | POA: Diagnosis not present

## 2021-03-24 DIAGNOSIS — Z299 Encounter for prophylactic measures, unspecified: Secondary | ICD-10-CM | POA: Diagnosis not present

## 2021-03-24 DIAGNOSIS — Z79899 Other long term (current) drug therapy: Secondary | ICD-10-CM | POA: Diagnosis not present

## 2021-03-24 DIAGNOSIS — M159 Polyosteoarthritis, unspecified: Secondary | ICD-10-CM | POA: Diagnosis not present

## 2021-03-24 DIAGNOSIS — F17210 Nicotine dependence, cigarettes, uncomplicated: Secondary | ICD-10-CM | POA: Diagnosis not present

## 2021-03-24 DIAGNOSIS — E1122 Type 2 diabetes mellitus with diabetic chronic kidney disease: Secondary | ICD-10-CM | POA: Diagnosis not present

## 2021-03-24 DIAGNOSIS — E1165 Type 2 diabetes mellitus with hyperglycemia: Secondary | ICD-10-CM | POA: Diagnosis not present

## 2021-04-07 DIAGNOSIS — E119 Type 2 diabetes mellitus without complications: Secondary | ICD-10-CM | POA: Diagnosis not present

## 2021-04-21 DIAGNOSIS — Z79899 Other long term (current) drug therapy: Secondary | ICD-10-CM | POA: Diagnosis not present

## 2021-04-21 DIAGNOSIS — M549 Dorsalgia, unspecified: Secondary | ICD-10-CM | POA: Diagnosis not present

## 2021-04-21 DIAGNOSIS — F17210 Nicotine dependence, cigarettes, uncomplicated: Secondary | ICD-10-CM | POA: Diagnosis not present

## 2021-04-21 DIAGNOSIS — E1165 Type 2 diabetes mellitus with hyperglycemia: Secondary | ICD-10-CM | POA: Diagnosis not present

## 2021-04-21 DIAGNOSIS — Z299 Encounter for prophylactic measures, unspecified: Secondary | ICD-10-CM | POA: Diagnosis not present

## 2021-05-08 ENCOUNTER — Telehealth: Payer: Self-pay | Admitting: Cardiology

## 2021-05-08 NOTE — Telephone Encounter (Signed)
Unable to contact patient, patient's primary cardiologist at [facility], has also tried to contact him to schedule testing.  We will discontinue remote monitoring for now, at least patient has a loop recorder in place, if he has any episode of syncope or any cardiac admissions, we will be able to interrogate the device.  He was seen by me about 3 months ago, he wanted the loop explanted, I have advised him to leave the loop in so at least we can monitor if need arises.   Adrian Prows, MD, [facility] 05/08/2021, 11:53 AM Office: [phone] Fax: [phone] Pager: [phone]

## 2021-05-25 DIAGNOSIS — G8194 Hemiplegia, unspecified affecting left nondominant side: Secondary | ICD-10-CM | POA: Diagnosis not present

## 2021-05-25 DIAGNOSIS — E11621 Type 2 diabetes mellitus with foot ulcer: Secondary | ICD-10-CM | POA: Diagnosis not present

## 2021-05-25 DIAGNOSIS — F17210 Nicotine dependence, cigarettes, uncomplicated: Secondary | ICD-10-CM | POA: Diagnosis not present

## 2021-05-25 DIAGNOSIS — L97509 Non-pressure chronic ulcer of other part of unspecified foot with unspecified severity: Secondary | ICD-10-CM | POA: Diagnosis not present

## 2021-05-25 DIAGNOSIS — Z299 Encounter for prophylactic measures, unspecified: Secondary | ICD-10-CM | POA: Diagnosis not present

## 2021-05-25 DIAGNOSIS — M549 Dorsalgia, unspecified: Secondary | ICD-10-CM | POA: Diagnosis not present

## 2021-05-31 ENCOUNTER — Inpatient Hospital Stay (HOSPITAL_COMMUNITY)
Admission: EM | Admit: 2021-05-31 | Discharge: 2021-06-05 | DRG: 038 | Disposition: A | Discharge status: Home / Self Care | Payer: Medicare Other | Attending: Family Medicine | Admitting: Family Medicine

## 2021-05-31 ENCOUNTER — Encounter (HOSPITAL_COMMUNITY): Payer: Self-pay

## 2021-05-31 ENCOUNTER — Emergency Department (HOSPITAL_COMMUNITY): Payer: Medicare Other

## 2021-05-31 ENCOUNTER — Inpatient Hospital Stay (HOSPITAL_COMMUNITY): Payer: Medicare Other

## 2021-05-31 ENCOUNTER — Other Ambulatory Visit: Payer: Self-pay

## 2021-05-31 DIAGNOSIS — I70245 Atherosclerosis of native arteries of left leg with ulceration of other part of foot: Secondary | ICD-10-CM | POA: Diagnosis present

## 2021-05-31 DIAGNOSIS — Z79899 Other long term (current) drug therapy: Secondary | ICD-10-CM

## 2021-05-31 DIAGNOSIS — H5462 Unqualified visual loss, left eye, normal vision right eye: Secondary | ICD-10-CM | POA: Diagnosis present

## 2021-05-31 DIAGNOSIS — I10 Essential (primary) hypertension: Secondary | ICD-10-CM | POA: Diagnosis not present

## 2021-05-31 DIAGNOSIS — M549 Dorsalgia, unspecified: Secondary | ICD-10-CM | POA: Diagnosis present

## 2021-05-31 DIAGNOSIS — R0609 Other forms of dyspnea: Secondary | ICD-10-CM | POA: Diagnosis not present

## 2021-05-31 DIAGNOSIS — E785 Hyperlipidemia, unspecified: Secondary | ICD-10-CM | POA: Diagnosis present

## 2021-05-31 DIAGNOSIS — G629 Polyneuropathy, unspecified: Secondary | ICD-10-CM | POA: Diagnosis not present

## 2021-05-31 DIAGNOSIS — Z91041 Radiographic dye allergy status: Secondary | ICD-10-CM | POA: Diagnosis not present

## 2021-05-31 DIAGNOSIS — Z833 Family history of diabetes mellitus: Secondary | ICD-10-CM

## 2021-05-31 DIAGNOSIS — I4891 Unspecified atrial fibrillation: Secondary | ICD-10-CM | POA: Diagnosis present

## 2021-05-31 DIAGNOSIS — I2510 Atherosclerotic heart disease of native coronary artery without angina pectoris: Secondary | ICD-10-CM | POA: Diagnosis not present

## 2021-05-31 DIAGNOSIS — G8929 Other chronic pain: Secondary | ICD-10-CM | POA: Diagnosis not present

## 2021-05-31 DIAGNOSIS — H35033 Hypertensive retinopathy, bilateral: Secondary | ICD-10-CM | POA: Diagnosis not present

## 2021-05-31 DIAGNOSIS — I6522 Occlusion and stenosis of left carotid artery: Principal | ICD-10-CM

## 2021-05-31 DIAGNOSIS — M869 Osteomyelitis, unspecified: Secondary | ICD-10-CM | POA: Diagnosis not present

## 2021-05-31 DIAGNOSIS — Z20822 Contact with and (suspected) exposure to covid-19: Secondary | ICD-10-CM | POA: Diagnosis present

## 2021-05-31 DIAGNOSIS — Z888 Allergy status to other drugs, medicaments and biological substances status: Secondary | ICD-10-CM | POA: Diagnosis not present

## 2021-05-31 DIAGNOSIS — F17210 Nicotine dependence, cigarettes, uncomplicated: Secondary | ICD-10-CM | POA: Diagnosis present

## 2021-05-31 DIAGNOSIS — Z7982 Long term (current) use of aspirin: Secondary | ICD-10-CM

## 2021-05-31 DIAGNOSIS — Z9151 Personal history of suicidal behavior: Secondary | ICD-10-CM

## 2021-05-31 DIAGNOSIS — H547 Unspecified visual loss: Secondary | ICD-10-CM | POA: Diagnosis not present

## 2021-05-31 DIAGNOSIS — L03116 Cellulitis of left lower limb: Secondary | ICD-10-CM | POA: Diagnosis present

## 2021-05-31 DIAGNOSIS — L03115 Cellulitis of right lower limb: Secondary | ICD-10-CM | POA: Diagnosis not present

## 2021-05-31 DIAGNOSIS — I70211 Atherosclerosis of native arteries of extremities with intermittent claudication, right leg: Secondary | ICD-10-CM | POA: Diagnosis present

## 2021-05-31 DIAGNOSIS — R531 Weakness: Secondary | ICD-10-CM | POA: Diagnosis not present

## 2021-05-31 DIAGNOSIS — I6503 Occlusion and stenosis of bilateral vertebral arteries: Secondary | ICD-10-CM | POA: Diagnosis not present

## 2021-05-31 DIAGNOSIS — E669 Obesity, unspecified: Secondary | ICD-10-CM | POA: Diagnosis present

## 2021-05-31 DIAGNOSIS — Z6832 Body mass index (BMI) 32.0-32.9, adult: Secondary | ICD-10-CM

## 2021-05-31 DIAGNOSIS — R600 Localized edema: Secondary | ICD-10-CM | POA: Diagnosis not present

## 2021-05-31 DIAGNOSIS — F17200 Nicotine dependence, unspecified, uncomplicated: Secondary | ICD-10-CM

## 2021-05-31 DIAGNOSIS — R9389 Abnormal findings on diagnostic imaging of other specified body structures: Secondary | ICD-10-CM | POA: Diagnosis present

## 2021-05-31 DIAGNOSIS — I679 Cerebrovascular disease, unspecified: Secondary | ICD-10-CM

## 2021-05-31 DIAGNOSIS — J449 Chronic obstructive pulmonary disease, unspecified: Secondary | ICD-10-CM | POA: Diagnosis present

## 2021-05-31 DIAGNOSIS — Z66 Do not resuscitate: Secondary | ICD-10-CM | POA: Diagnosis not present

## 2021-05-31 DIAGNOSIS — H3412 Central retinal artery occlusion, left eye: Secondary | ICD-10-CM

## 2021-05-31 DIAGNOSIS — F1410 Cocaine abuse, uncomplicated: Secondary | ICD-10-CM

## 2021-05-31 DIAGNOSIS — I878 Other specified disorders of veins: Secondary | ICD-10-CM | POA: Diagnosis present

## 2021-05-31 DIAGNOSIS — I672 Cerebral atherosclerosis: Secondary | ICD-10-CM | POA: Diagnosis not present

## 2021-05-31 DIAGNOSIS — Z9049 Acquired absence of other specified parts of digestive tract: Secondary | ICD-10-CM

## 2021-05-31 DIAGNOSIS — Z8249 Family history of ischemic heart disease and other diseases of the circulatory system: Secondary | ICD-10-CM

## 2021-05-31 DIAGNOSIS — L0390 Cellulitis, unspecified: Secondary | ICD-10-CM | POA: Diagnosis not present

## 2021-05-31 DIAGNOSIS — Z716 Tobacco abuse counseling: Secondary | ICD-10-CM

## 2021-05-31 DIAGNOSIS — I6523 Occlusion and stenosis of bilateral carotid arteries: Secondary | ICD-10-CM | POA: Diagnosis not present

## 2021-05-31 DIAGNOSIS — F329 Major depressive disorder, single episode, unspecified: Secondary | ICD-10-CM | POA: Diagnosis present

## 2021-05-31 DIAGNOSIS — I160 Hypertensive urgency: Secondary | ICD-10-CM | POA: Diagnosis not present

## 2021-05-31 DIAGNOSIS — L97529 Non-pressure chronic ulcer of other part of left foot with unspecified severity: Secondary | ICD-10-CM | POA: Diagnosis present

## 2021-05-31 DIAGNOSIS — K219 Gastro-esophageal reflux disease without esophagitis: Secondary | ICD-10-CM | POA: Diagnosis present

## 2021-05-31 DIAGNOSIS — Z79890 Hormone replacement therapy (postmenopausal): Secondary | ICD-10-CM

## 2021-05-31 DIAGNOSIS — R9082 White matter disease, unspecified: Secondary | ICD-10-CM | POA: Diagnosis not present

## 2021-05-31 DIAGNOSIS — I739 Peripheral vascular disease, unspecified: Secondary | ICD-10-CM | POA: Diagnosis not present

## 2021-05-31 DIAGNOSIS — I70213 Atherosclerosis of native arteries of extremities with intermittent claudication, bilateral legs: Secondary | ICD-10-CM | POA: Diagnosis not present

## 2021-05-31 LAB — DIFFERENTIAL
Abs Immature Granulocytes: 0.03 10*3/uL (ref 0.00–0.07)
Basophils Absolute: 0.1 10*3/uL (ref 0.0–0.1)
Basophils Relative: 1 %
Eosinophils Absolute: 0.2 10*3/uL (ref 0.0–0.5)
Eosinophils Relative: 2 %
Immature Granulocytes: 0 %
Lymphocytes Relative: 31 %
Lymphs Abs: 3 10*3/uL (ref 0.7–4.0)
Monocytes Absolute: 0.7 10*3/uL (ref 0.1–1.0)
Monocytes Relative: 7 %
Neutro Abs: 5.8 10*3/uL (ref 1.7–7.7)
Neutrophils Relative %: 59 %

## 2021-05-31 LAB — CBC
HCT: 52.3 % — ABNORMAL HIGH (ref 39.0–52.0)
Hemoglobin: 17.3 g/dL — ABNORMAL HIGH (ref 13.0–17.0)
MCH: 30.5 pg (ref 26.0–34.0)
MCHC: 33.1 g/dL (ref 30.0–36.0)
MCV: 92.1 fL (ref 80.0–100.0)
Platelets: 197 10*3/uL (ref 150–400)
RBC: 5.68 MIL/uL (ref 4.22–5.81)
RDW: 14 % (ref 11.5–15.5)
WBC: 9.7 10*3/uL (ref 4.0–10.5)
nRBC: 0.2 % (ref 0.0–0.2)

## 2021-05-31 LAB — COMPREHENSIVE METABOLIC PANEL
ALT: 23 U/L (ref 0–44)
AST: 24 U/L (ref 15–41)
Albumin: 3.7 g/dL (ref 3.5–5.0)
Alkaline Phosphatase: 90 U/L (ref 38–126)
Anion gap: 12 (ref 5–15)
BUN: 16 mg/dL (ref 8–23)
CO2: 21 mmol/L — ABNORMAL LOW (ref 22–32)
Calcium: 9.1 mg/dL (ref 8.9–10.3)
Chloride: 107 mmol/L (ref 98–111)
Creatinine, Ser: 1.06 mg/dL (ref 0.61–1.24)
GFR, Estimated: >60 mL/min (ref >60)
Glucose, Bld: 151 mg/dL — ABNORMAL HIGH (ref 70–99)
Potassium: 4.1 mmol/L (ref 3.5–5.1)
Sodium: 140 mmol/L (ref 135–145)
Total Bilirubin: 0.4 mg/dL (ref 0.3–1.2)
Total Protein: 6.4 g/dL — ABNORMAL LOW (ref 6.5–8.1)

## 2021-05-31 LAB — URINALYSIS, ROUTINE W REFLEX MICROSCOPIC
Bilirubin Urine: NEGATIVE
Glucose, UA: NEGATIVE mg/dL
Hgb urine dipstick: NEGATIVE
Ketones, ur: 5 mg/dL — AB
Leukocytes,Ua: NEGATIVE
Nitrite: NEGATIVE
Protein, ur: NEGATIVE mg/dL
Specific Gravity, Urine: 1.023 (ref 1.005–1.030)
pH: 6 (ref 5.0–8.0)

## 2021-05-31 LAB — RAPID URINE DRUG SCREEN, HOSP PERFORMED
Amphetamines: NOT DETECTED
Barbiturates: NOT DETECTED
Benzodiazepines: NOT DETECTED
Cocaine: POSITIVE — AB
Opiates: NOT DETECTED
Tetrahydrocannabinol: NOT DETECTED

## 2021-05-31 LAB — HIV ANTIBODY (ROUTINE TESTING W REFLEX): HIV Screen 4th Generation wRfx: NONREACTIVE

## 2021-05-31 LAB — PROTIME-INR
INR: 1 (ref 0.8–1.2)
Prothrombin Time: 13.3 seconds (ref 11.4–15.2)

## 2021-05-31 LAB — SEDIMENTATION RATE: Sed Rate: 1 mm/hr (ref 0–16)

## 2021-05-31 LAB — TSH: TSH: 0.78 u[IU]/mL (ref 0.350–4.500)

## 2021-05-31 LAB — HEMOGLOBIN A1C
Hgb A1c MFr Bld: 5.6 % (ref 4.8–5.6)
Mean Plasma Glucose: 114.02 mg/dL

## 2021-05-31 LAB — ETHANOL: Alcohol, Ethyl (B): <10 mg/dL (ref <10)

## 2021-05-31 LAB — RESP PANEL BY RT-PCR (FLU A&B, COVID) ARPGX2
Influenza A by PCR: NEGATIVE
Influenza B by PCR: NEGATIVE
SARS Coronavirus 2 by RT PCR: NEGATIVE

## 2021-05-31 LAB — APTT: aPTT: 28 seconds (ref 24–36)

## 2021-05-31 MED ORDER — DILTIAZEM HCL ER COATED BEADS 180 MG PO CP24
180.0000 mg | ORAL_CAPSULE | Freq: Every day | ORAL | Status: DC
  Administered 2021-06-01 – 2021-06-04 (×4): 180 mg via ORAL
  Filled 2021-05-31 (×4): qty 1

## 2021-05-31 MED ORDER — HYDROCODONE-ACETAMINOPHEN 5-325 MG PO TABS
1.0000 | ORAL_TABLET | Freq: Two times a day (BID) | ORAL | Status: DC | PRN
Start: 2021-05-31 — End: 2021-06-05
  Administered 2021-05-31 – 2021-06-05 (×9): 1 via ORAL
  Filled 2021-05-31 (×9): qty 1

## 2021-05-31 MED ORDER — ASPIRIN EC 81 MG PO TBEC
81.0000 mg | DELAYED_RELEASE_TABLET | Freq: Every day | ORAL | Status: DC
  Administered 2021-05-31 – 2021-06-03 (×4): 81 mg via ORAL
  Filled 2021-05-31 (×4): qty 1

## 2021-05-31 MED ORDER — LORAZEPAM 2 MG/ML IJ SOLN: 1.0000 mg | INTRAMUSCULAR | Status: DC | PRN

## 2021-05-31 MED ORDER — ROSUVASTATIN CALCIUM 5 MG PO TABS
10.0000 mg | ORAL_TABLET | Freq: Every day | ORAL | Status: DC
  Administered 2021-05-31: 10 mg via ORAL
  Filled 2021-05-31: qty 2

## 2021-05-31 MED ORDER — NICOTINE 21 MG/24HR TD PT24
21.0000 mg | MEDICATED_PATCH | Freq: Every day | TRANSDERMAL | Status: DC
  Administered 2021-05-31 – 2021-06-05 (×6): 21 mg via TRANSDERMAL
  Filled 2021-05-31 (×6): qty 1

## 2021-05-31 MED ORDER — CLOPIDOGREL BISULFATE 75 MG PO TABS
75.0000 mg | ORAL_TABLET | Freq: Every day | ORAL | Status: DC
  Administered 2021-05-31 – 2021-06-01 (×2): 75 mg via ORAL
  Filled 2021-05-31 (×2): qty 1

## 2021-05-31 MED ORDER — GADOBUTROL 1 MMOL/ML IV SOLN
10.0000 mL | Freq: Once | INTRAVENOUS | Status: AC | PRN
  Administered 2021-05-31: 10 mL via INTRAVENOUS

## 2021-05-31 MED ORDER — DULOXETINE HCL 60 MG PO CPEP
60.0000 mg | ORAL_CAPSULE | Freq: Every day | ORAL | Status: DC
  Administered 2021-05-31 – 2021-06-04 (×5): 60 mg via ORAL
  Filled 2021-05-31 (×6): qty 1

## 2021-05-31 MED ORDER — HYDROCERIN EX CREA
TOPICAL_CREAM | Freq: Every day | CUTANEOUS | Status: DC
Start: 2021-05-31 — End: 2021-06-05
  Filled 2021-05-31: qty 113

## 2021-05-31 MED ORDER — GABAPENTIN 100 MG PO CAPS
100.0000 mg | ORAL_CAPSULE | Freq: Two times a day (BID) | ORAL | Status: DC
  Administered 2021-05-31 – 2021-06-05 (×9): 100 mg via ORAL
  Filled 2021-05-31 (×10): qty 1

## 2021-05-31 MED ORDER — ACETAMINOPHEN 325 MG PO TABS: 650.0000 mg | ORAL_TABLET | Freq: Four times a day (QID) | ORAL | Status: DC | PRN
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
## 2021-05-31 NOTE — ED Notes (Signed)
Pt back from MRI just at this moment  ?

## 2021-05-31 NOTE — ED Notes (Signed)
Pt transported to CT ?

## 2021-05-31 NOTE — Consult Note (Signed)
WOC Nurse Consult Note: ?Reason for Consult:Bilateral LEs with edema, dry skin, ulceration at 3rd digit or left foot ?Wound type:neuropathic vs PAD ?Pressure Injury POA: N/A ?Measurement:1cm round with depth obscured by the presence of nonviable tissue ?Wound bed:N/A ?Drainage (amount, consistency, odor) None ?Periwound: As noted above. Poor hygiene, long toenails in need of debridement ?Dressing procedure/placement/frequency: I have provided Nursing with guidance for the topical care of the LEs using a soap and water cleanse followed by the application of Eucerin cream applied to the feet and legs but not between the digits. The ulceration on the 3rd digit of the left foot will be painted with povidone iodine swabsticks and allowed to air dry. ?A silicone foam will be placed to the sacrum for pressure injury prophylaxis and feet will be protected and elevated by placing into Prevalon boots while in house. ? ?[place] nursing team will not follow, but will remain available to this patient, the nursing and medical teams.  Please re-consult if needed. ?Thanks, ?Maudie Flakes, MSN, RN, [place], [place], [facility], [place]  ?Pager# [phone]  ? ? ? ?  ?

## 2021-05-31 NOTE — Hospital Course (Addendum)
Michael Chambers is a 61 y.o.male with a history of HTN, HLD, CAD s/p loop recorder placement, COPD, tobacco use, cocaine use, MDD who was admitted to the [facility] Teaching Service at [facility] for abrupt mononuclear vision loss consistent with central retinal artery occlusion. His hospital course is detailed below: ? ?L central retinal artery occlusion ?Abrupt mononuclear vision loss in left eye on 3/5, evaluated outpatient by ophthalmology on 3/6 and diagnosed with central retinal artery occlusion. Admitted for further workup and neurology consulted.  CT head without intracranial findings.  MR brain showed no evidence of acute intracranial abnormality.  MRA head did not show a large vessel occlusion.  MRA neck showed moderate narrowing of the left ICA origin.  UDS cocaine positive.  Per neurology recommendations patient was loaded with aspirin 325 mg and placed on aspirin 81 mg daily in addition to Plavix 75 mg daily x21 days. Vascular surgery was consulted and evaluated the patient.  Due to 60% left ICA stenosis with retinal infarct it was recommended a left carotid endarterectomy be performed.  This was completed on 3/10. ? ? ?CAD, history of A-fib and PSVT ?Echo noted LVEF 60 to 65%.  Grade 1 diastolic dysfunction appreciated.  Trivial mitral valve regurg.  Which was compared to last echo on 10/2019 with LVEF 60%, no LV hypokinesis. ? ? 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  No drainable fluid collection to suggest an abscess.  Discussed this with vascular surgery who recommended outpatient follow-up for this. ? ?Other chronic conditions were medically managed with home medications and formulary alternatives as necessary  ? ?PCP Follow-up Recommendations: ?Ensure patient has follow-up with vascular surgery for further evaluation of his possible  osteomyelitis. ?Patient is to follow-up with PCP to have order for OT placed ?Ensure patient is taking 81 mg aspirin as well as Plavix 75 mg daily.  He should follow-up with neurology approximately 4 weeks after discharge. ?Patient's Crestor increased to 20 mg daily, ensure he is filled this prescription and is taking this dose. ?

## 2021-05-31 NOTE — ED Provider Notes (Signed)
[facility] EMERGENCY DEPARTMENT Provider Note   CSN: [id number] Arrival date & time: 05/31/21  1057     History  Chief Complaint  Patient presents with   Eye Problem    Michael Chambers is a 61 y.o. male.   Eye Problem Patient has history of COPD, chronic tobacco use,, hypertension, coronary artery disease patient states she also has history of a blockage in one of the blood vessels in his neck. Patient presents to the ED for evaluation of acute vision loss.  Patient states he noticed the symptoms yesterday when he had acute loss of vision in his left eye.  Patient states it is completely black in his left eye.  The right eye is fine.  He is not having any trouble with any numbness or tingling.  No trouble with his speech.  He denies any weakness with the exception of chronic weakness in his right arm from a prior injury.  Home Medications Prior to Admission medications   Medication Sig Start Date End Date Taking? Authorizing Provider  aspirin (ASPIRIN CHILDRENS) 81 MG chewable tablet Chew 1 tablet (81 mg total) by mouth daily. 09/30/20   Adrian Prows, MD  diltiazem (CARDIZEM CD) 180 MG 24 hr capsule Take 180 mg by mouth daily. 12/14/19   [provider]  DULoxetine (CYMBALTA) 60 MG capsule Take 60 mg by mouth daily. 09/24/20   [provider]  gabapentin (NEURONTIN) 100 MG capsule Take 1 capsule by mouth daily. 09/24/20   [provider]  HYDROcodone-acetaminophen (NORCO/VICODIN) 5-325 MG tablet Take 1 tablet by mouth daily as needed. 03/22/18   [provider]  Melatonin 5 MG CAPS Take 1 capsule by mouth as needed.    [provider]  rosuvastatin (CRESTOR) 10 MG tablet Take 10 mg by mouth at bedtime. 06/19/20   [provider]      Allergies    Dimetapp cold-allergy [brompheniramine-phenylephrine], Brompheniramine, Chlorpheniramine, Dextromethorphan, Ibuprofen, and Ivp dye [iodinated contrast media]    Review of  Systems   Review of Systems  Physical Exam Updated Vital Signs BP 116/89 (BP Location: Left Arm)    Pulse 96    Temp 98.6 F (37 C)    Resp 16    Ht 1.753 m ('5\' 9"'$ )    Wt 100 kg    SpO2 97%    BMI 32.56 kg/m  Physical Exam Vitals and nursing note reviewed.  Constitutional:      General: He is not in acute distress.    Appearance: He is well-developed.  HENT:     Head: Normocephalic and atraumatic.     Right Ear: External ear normal.     Left Ear: External ear normal.  Eyes:     General: No scleral icterus.       Right eye: No discharge.        Left eye: No discharge.     Conjunctiva/sclera: Conjunctivae normal.     Comments: APD left eye, absent vision left eye  Neck:     Trachea: No tracheal deviation.  Cardiovascular:     Rate and Rhythm: Normal rate and regular rhythm.  Pulmonary:     Effort: Pulmonary effort is normal. No respiratory distress.     Breath sounds: Normal breath sounds. No stridor. No wheezing or rales.  Abdominal:     General: Bowel sounds are normal. There is no distension.     Palpations: Abdomen is soft.     Tenderness: There is no abdominal  tenderness. There is no guarding or rebound.  Musculoskeletal:        General: No tenderness or deformity.     Cervical back: Neck supple.     Comments: Old scar right upper extremity  Skin:    General: Skin is warm and dry.     Findings: No rash.  Neurological:     General: No focal deficit present.     Mental Status: He is alert.     Cranial Nerves: No cranial nerve deficit (no facial droop, extraocular movements intact, no slurred speech).     Motor: No abnormal muscle tone or seizure activity.     Coordination: Coordination normal.     Comments: Atrophy weakness contracture right upper extremity  Psychiatric:        Mood and Affect: Mood normal.    ED Results / Procedures / Treatments   Labs (all labs ordered are listed, but only abnormal results are displayed) Labs Reviewed  CBC - Abnormal;  Notable for the following components:      Result Value   Hemoglobin 17.3 (*)    HCT 52.3 (*)    All other components within normal limits  COMPREHENSIVE METABOLIC PANEL - Abnormal; Notable for the following components:   CO2 21 (*)    Glucose, Bld 151 (*)    Total Protein 6.4 (*)    All other components within normal limits  RAPID URINE DRUG SCREEN, HOSP PERFORMED - Abnormal; Notable for the following components:   Cocaine POSITIVE (*)    All other components within normal limits  URINALYSIS, ROUTINE W REFLEX MICROSCOPIC - Abnormal; Notable for the following components:   APPearance HAZY (*)    Ketones, ur 5 (*)    All other components within normal limits  RESP PANEL BY RT-PCR (FLU A&B, COVID) ARPGX2  ETHANOL  PROTIME-INR  APTT  DIFFERENTIAL    EKG EKG Interpretation  Date/Time:  Monday May 31 2021 11:39:08 EST Ventricular Rate:  100 PR Interval:  132 QRS Duration: 134 QT Interval:  382 QTC Calculation: 492 R Axis:   -79 Text Interpretation: Normal sinus rhythm Right bundle branch block Left anterior fascicular block  Bifascicular block  Abnormal ECG When compared with ECG of 08-Sep-2015 10:32, no change Confirmed by Dorie Rank [phone]) on 05/31/2021 12:04:48 PM  Radiology CT HEAD WO CONTRAST  Result Date: 05/31/2021 CLINICAL DATA:  Vision loss EXAM: CT HEAD WITHOUT CONTRAST TECHNIQUE: Contiguous axial images were obtained from the base of the skull through the vertex without intravenous contrast. RADIATION DOSE REDUCTION: This exam was performed according to the departmental dose-optimization program which includes automated exposure control, adjustment of the mA and/or kV according to patient size and/or use of iterative reconstruction technique. COMPARISON:  None. FINDINGS: Brain: No acute intracranial hemorrhage. No focal mass lesion. No CT evidence of acute infarction. No midline shift or mass effect. No hydrocephalus. Basilar cisterns are patent. Minimal periventricular  white matter hypodensities. Vascular: No hyperdense vessel or unexpected calcification. Skull: Normal. Negative for fracture or focal lesion. Sinuses/Orbits: Paranasal sinuses and mastoid air cells are clear. Orbits are clear. Other: None. IMPRESSION: 1. No acute intracranial findings. 2. Mild white matter microvascular disease. Electronically Signed   By: Suzy Bouchard M.D.   On: 05/31/2021 12:02    Procedures Procedures    Medications Ordered in ED Medications  LORazepam (ATIVAN) injection 1 mg (has no administration in time range)    ED Course/ Medical Decision Making/ A&P Clinical Course as of 05/31/21 1433  Mon May 31, 2021  1202 Records reviewed.  Patient does have a contrast allergy.  Unable to order emergent CT angio head neck at this time.  Patient states he also has a loop recorder.  We will need to see if this is MRI compatible. [JK]  1203 CBC(!) Normal [JK]  1204 Urinalysis, Routine w reflex microscopic(!) Normal [JK]  1233 Urine rapid drug screen (hosp performed)(!) UDS positive for cocaine [JK]  1234 CT HEAD WO CONTRAST Head CT images and radiology report reviewed.  No acute stroke or hemorrhage [JK]  1417 Case discussed with Dr. Malen Gauze.  Recommends admission for further stroke work-up.  Neurology team will consult [JK]  1433 DIscussed with Family medicine service regarding admission [JK]    Clinical Course User Index [JK] Dorie Rank, MD                           Medical Decision Making Amount and/or Complexity of Data Reviewed Labs: ordered. Decision-making details documented in ED Course. Radiology: ordered. Decision-making details documented in ED Course. ECG/medicine tests: ordered.  Risk Prescription drug management. Decision regarding hospitalization.   Patient presented to the ED with acute monocular vision loss.  Presentation concerning for retinal artery occlusion.  Patient symptoms ongoing for over 24 hours at this point.  Not a candidate for any  acute vascular intervention.  CT scan does not show any acute findings.  Patient will need further imaging, stroke workup.  He has a contrast allergy so we will proceed with MRI MRA of the head and neck.  Patient has a loop event monitor but should not be MRI safe.  Case discussed with neurology service.  I will consult the medical service for admission.        Final Clinical Impression(s) / ED Diagnoses Final diagnoses:  Central retinal artery occlusion of left eye      Dorie Rank, MD 06/01/21 1558

## 2021-05-31 NOTE — ED Triage Notes (Signed)
Pt arrives P[facility], referred by ophthalmologist for eval of new central retinal artery occlusion. Pt reports he awoke yesterday w/ acute loss of vision in the L eye. States vision field is completely black, reports that problem is ongoing this AM as well. Denies numbness/tingling, no focal weakness/unilateral deficits, no drift/droop.  ?
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
## 2021-06-01 ENCOUNTER — Inpatient Hospital Stay (HOSPITAL_COMMUNITY): Payer: Medicare Other

## 2021-06-01 DIAGNOSIS — F1410 Cocaine abuse, uncomplicated: Secondary | ICD-10-CM

## 2021-06-01 DIAGNOSIS — I10 Essential (primary) hypertension: Secondary | ICD-10-CM

## 2021-06-01 DIAGNOSIS — E785 Hyperlipidemia, unspecified: Secondary | ICD-10-CM

## 2021-06-01 DIAGNOSIS — F17200 Nicotine dependence, unspecified, uncomplicated: Secondary | ICD-10-CM

## 2021-06-01 DIAGNOSIS — L0390 Cellulitis, unspecified: Secondary | ICD-10-CM

## 2021-06-01 DIAGNOSIS — I70213 Atherosclerosis of native arteries of extremities with intermittent claudication, bilateral legs: Secondary | ICD-10-CM

## 2021-06-01 DIAGNOSIS — I4891 Unspecified atrial fibrillation: Secondary | ICD-10-CM

## 2021-06-01 DIAGNOSIS — I739 Peripheral vascular disease, unspecified: Secondary | ICD-10-CM

## 2021-06-01 DIAGNOSIS — J449 Chronic obstructive pulmonary disease, unspecified: Secondary | ICD-10-CM

## 2021-06-01 DIAGNOSIS — H3412 Central retinal artery occlusion, left eye: Principal | ICD-10-CM

## 2021-06-01 DIAGNOSIS — R0609 Other forms of dyspnea: Secondary | ICD-10-CM

## 2021-06-01 DIAGNOSIS — I6522 Occlusion and stenosis of left carotid artery: Secondary | ICD-10-CM

## 2021-06-01 DIAGNOSIS — I160 Hypertensive urgency: Secondary | ICD-10-CM

## 2021-06-01 DIAGNOSIS — I679 Cerebrovascular disease, unspecified: Secondary | ICD-10-CM

## 2021-06-01 LAB — CBC WITH DIFFERENTIAL/PLATELET
Abs Immature Granulocytes: 0.02 10*3/uL (ref 0.00–0.07)
Basophils Absolute: 0.1 10*3/uL (ref 0.0–0.1)
Basophils Relative: 1 %
Eosinophils Absolute: 0.2 10*3/uL (ref 0.0–0.5)
Eosinophils Relative: 3 %
HCT: 47.6 % (ref 39.0–52.0)
Hemoglobin: 15.8 g/dL (ref 13.0–17.0)
Immature Granulocytes: 0 %
Lymphocytes Relative: 42 %
Lymphs Abs: 3.8 10*3/uL (ref 0.7–4.0)
MCH: 30.3 pg (ref 26.0–34.0)
MCHC: 33.2 g/dL (ref 30.0–36.0)
MCV: 91.4 fL (ref 80.0–100.0)
Monocytes Absolute: 0.8 10*3/uL (ref 0.1–1.0)
Monocytes Relative: 9 %
Neutro Abs: 4.2 10*3/uL (ref 1.7–7.7)
Neutrophils Relative %: 45 %
Platelets: 182 10*3/uL (ref 150–400)
RBC: 5.21 MIL/uL (ref 4.22–5.81)
RDW: 13.7 % (ref 11.5–15.5)
WBC: 9.1 10*3/uL (ref 4.0–10.5)
nRBC: 0 % (ref 0.0–0.2)

## 2021-06-01 LAB — BASIC METABOLIC PANEL
Anion gap: 8 (ref 5–15)
BUN: 18 mg/dL (ref 8–23)
CO2: 24 mmol/L (ref 22–32)
Calcium: 8.9 mg/dL (ref 8.9–10.3)
Chloride: 106 mmol/L (ref 98–111)
Creatinine, Ser: 1.17 mg/dL (ref 0.61–1.24)
GFR, Estimated: >60 mL/min (ref >60)
Glucose, Bld: 121 mg/dL — ABNORMAL HIGH (ref 70–99)
Potassium: 4 mmol/L (ref 3.5–5.1)
Sodium: 138 mmol/L (ref 135–145)

## 2021-06-01 LAB — LIPID PANEL
Cholesterol: 157 mg/dL (ref 0–200)
HDL: 32 mg/dL — ABNORMAL LOW (ref >40)
LDL Cholesterol: 82 mg/dL (ref 0–99)
Total CHOL/HDL Ratio: 4.9 RATIO
Triglycerides: 214 mg/dL — ABNORMAL HIGH (ref <150)
VLDL: 43 mg/dL — ABNORMAL HIGH (ref 0–40)

## 2021-06-01 LAB — ECHOCARDIOGRAM COMPLETE
AR max vel: 2.09 cm2
AV Peak grad: 10.5 mmHg
Ao pk vel: 1.62 m/s
Area-P 1/2: 3.43 cm2
Calc EF: 59.3 %
Height: 69 in
S' Lateral: 3.4 cm
Single Plane A2C EF: 57.6 %
Single Plane A4C EF: 59.1 %
Weight: 3534.41 [oz_av]

## 2021-06-01 LAB — MAGNESIUM: Magnesium: 2.1 mg/dL (ref 1.7–2.4)

## 2021-06-01 MED ORDER — GADOBUTROL 1 MMOL/ML IV SOLN
10.0000 mL | Freq: Once | INTRAVENOUS | Status: AC | PRN
  Administered 2021-06-01: 10 mL via INTRAVENOUS

## 2021-06-01 MED ORDER — DIPHENHYDRAMINE HCL 50 MG/ML IJ SOLN: 50.0000 mg | Freq: Once | INTRAMUSCULAR | Status: AC

## 2021-06-01 MED ORDER — ROSUVASTATIN CALCIUM 20 MG PO TABS
20.0000 mg | ORAL_TABLET | Freq: Every day | ORAL | Status: DC
Start: 2021-06-01 — End: 2021-06-05
  Administered 2021-06-01 – 2021-06-04 (×4): 20 mg via ORAL
  Filled 2021-06-01 (×4): qty 1

## 2021-06-01 MED ORDER — DIPHENHYDRAMINE HCL 25 MG PO CAPS
50.0000 mg | ORAL_CAPSULE | Freq: Once | ORAL | Status: AC
  Administered 2021-06-02: 50 mg via ORAL
  Filled 2021-06-01: qty 2

## 2021-06-01 MED ORDER — PREDNISONE 50 MG PO TABS
50.0000 mg | ORAL_TABLET | Freq: Four times a day (QID) | ORAL | Status: AC
  Administered 2021-06-01 – 2021-06-02 (×3): 50 mg via ORAL
  Filled 2021-06-01 (×3): qty 1

## 2021-06-01 MED ORDER — PERFLUTREN LIPID MICROSPHERE
1.0000 mL | INTRAVENOUS | Status: AC | PRN
  Administered 2021-06-01: 2 mL via INTRAVENOUS
  Filled 2021-06-01: qty 10

## 2021-06-01 NOTE — Progress Notes (Signed)
Pt c/o some SOB, Spo2 96% on RA, however pt does exhibit some labored breathing and dyspnea on exertion. O2 x 2L nasal cannula applied for comfort, will continue to monitor  ?
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
## 2021-06-01 NOTE — Progress Notes (Signed)
FPTS Interim Night Progress Note ? ?S:Patient sleeping comfortably.  Rounded with primary night RN.  No concerns voiced.  No orders required.   ? ?O: ?Today's Vitals  ? 05/31/21 2200 05/31/21 2238 06/01/21 0402 06/01/21 0408  ?BP: 132/74  126/89   ?Pulse: 88  66   ?Resp: 18  18   ?Temp: 98.2 ?F (36.8 ?C)  97.8 ?F (36.6 ?C)   ?TempSrc: Oral  Oral   ?SpO2: 99%  96% 99%  ?Weight: 100.2 kg     ?Height: '5\' 9"'$  (1.753 m)     ?PainSc:  0-No pain    ? ? ? ? ?A/P: ?Continue current management ? ?Carollee Leitz MD ?PGY-3, [facility] ?Service pager [phone]   ?
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
## 2021-06-01 NOTE — Progress Notes (Addendum)
Family Medicine Teaching Service Daily Progress Note Intern Pager: [phone]  [name] Medical record number: [id number] Date of birth: January 19, 1961 Age: 61 y.o. Gender: male  Primary Care Provider: Monico Blitz, MD Consultants: Neurology, Vascular surgery Code Status: DNR  Pt Overview and Major Events to Date:  3/6- Admitted, MRA with left ICA stenosis   Assessment and Plan: Michael Chambers is a 61 year old male here with CRAO,need for further workup.  Central retinal artery occlusion Asymptomatic this morning. No new neuro deficits. Continued visual field loss in left eye. Able to see some light out of his left eye, EOMI except cannot track to left. MRA with left ICA stenosis. CTA could provide more accurate quantification off stenosis, will consider. Otherwise, imaging of head/brain unremarkable. Will consult vascular surgery for recommendations to see if he is surgical candidate for possible carotid endarterectomy. Sed rate of 1, much less likely to be 2/2 giant cell arteritis. TSH wnl. A1c 5.6%. Goal to continue risk reduction for further neurologic events and manage current blockage. - Neurology following - Continue ASA '81mg'$  daily (s/p ASA load), and Plavix '75mg'$  daily x21 days - Cardiac monitoring - Carotid dopplers - Echocardiogram - Neuro checks - Pt/OT eval and treat  Essential HTN Chronic. BP well-controlled here within goal <140/90. - Continue Diltiazem - Monitor VS  HLD Repeat lipid panel with total cholesterol 157, HDL 32, LDL 82, TG 214 - Increase to high-intensity statin, Rosuvastatin '20mg'$  daily - Discuss importance of exercise/diet to increase HDL and lower TG/LDL  CAD, hx atrial fibrillation and PSVT, s/p loop recorder No atrial fibrillation or arrhythmias so far. - Continue Diltiazem '180mg'$  daily - Echocardiogram  Chronic back pain Hx multiple back surgeries (laminectomy, lumbar fusion). Takes Norco 5-325 BID for pain. PDMP reviewed. -  Continue Norco 5-325 BID PRN - Tylenol '650mg'$  q6h PRN for mild/moderate pain  Peripheral neuropathy and lower extremity edema/wound; concern for osteomyelitis vs. cellulitis Continued loss of sensation in left foot. Pulses palpable, appears well perfused. Afebrile without sign of infection. Will not start abx until result of MRI.  - MRI left foot w/ and w/o contrast this morning   FEN/GI: Heart healthy diet PPx: Lovenox Dispo:Pending PT recommendations  pending clinical improvement . Barriers include further workup requiring inpatient hospitalization.   Subjective:  Feeling well. In better spirits this morning. No complaints. Can see a little better out of left eye today, able to see a little bit of light. No numbness, tingling, chest pain, dizziness.  Objective: Temp:  [97.8 F (36.6 C)-98.6 F (37 C)] 97.8 F (36.6 C) (03/07 0829) Pulse Rate:  [66-103] 66 (03/07 0829) Resp:  [16-18] 18 (03/07 0829) BP: (116-147)/(67-95) 133/67 (03/07 0829) SpO2:  [95 %-99 %] 96 % (03/07 0829) Weight:  [100 kg-100.2 kg] 100.2 kg (03/06 2200) Physical Exam: General: Pleasant, sitting in chair, no distress Cardiovascular: RRR Respiratory: Clear, better air movement than yesterday Abdomen: Soft, non-tender Extremities: 2+ pitting edema in BL LE with chronic venous stasis. Warmth, healed scabs and erythema over BL LE up to shin.No sensation to touch over dorsal surface of feet. Somewhat improved edema in left footSee image in media tab.  Laboratory: Recent Labs  Lab 05/31/21 1117 06/01/21 0052  WBC 9.7 9.1  HGB 17.3* 15.8  HCT 52.3* 47.6  PLT 197 182   Recent Labs  Lab 05/31/21 1117 06/01/21 0052  NA 140 138  K 4.1 4.0  CL 107 106  CO2 21* 24  BUN 16 18  CREATININE 1.06 1.17  CALCIUM 9.1 8.9  PROT 6.4*  --   BILITOT 0.4  --   ALKPHOS 90  --   ALT 23  --   AST 24  --   GLUCOSE 151* 121*     Imaging/Diagnostic Tests: CT HEAD WO CONTRAST  Result Date: 05/31/2021 CLINICAL DATA:   Vision loss EXAM: CT HEAD WITHOUT CONTRAST TECHNIQUE: Contiguous axial images were obtained from the base of the skull through the vertex without intravenous contrast. RADIATION DOSE REDUCTION: This exam was performed according to the departmental dose-optimization program which includes automated exposure control, adjustment of the mA and/or kV according to patient size and/or use of iterative reconstruction technique. COMPARISON:  None. FINDINGS: Brain: No acute intracranial hemorrhage. No focal mass lesion. No CT evidence of acute infarction. No midline shift or mass effect. No hydrocephalus. Basilar cisterns are patent. Minimal periventricular white matter hypodensities. Vascular: No hyperdense vessel or unexpected calcification. Skull: Normal. Negative for fracture or focal lesion. Sinuses/Orbits: Paranasal sinuses and mastoid air cells are clear. Orbits are clear. Other: None. IMPRESSION: 1. No acute intracranial findings. 2. Mild white matter microvascular disease. Electronically Signed   By: Suzy Bouchard M.D.   On: 05/31/2021 12:02   MR ANGIO HEAD WO CONTRAST  Result Date: 05/31/2021 CLINICAL DATA:  Neuro deficit, acute, stroke suspected; Stroke/TIA, determine embolic source EXAM: MRI HEAD WITHOUT CONTRAST MRA HEAD WITHOUT CONTRAST MRA NECK WITHOUT AND WITH CONTRAST TECHNIQUE: Multiplanar, multi-echo pulse sequences of the brain and surrounding structures were acquired without intravenous contrast. Angiographic images of the Circle of Willis were acquired using MRA technique without intravenous contrast. Angiographic images of the neck were acquired using MRA technique without and with intravenous contrast. Carotid stenosis measurements (when applicable) are obtained utilizing NASCET criteria, using the distal internal carotid diameter as the denominator. CONTRAST:  23m GADAVIST GADOBUTROL 1 MMOL/ML IV SOLN COMPARISON:  Same day CT head. FINDINGS: MRI HEAD FINDINGS Brain: No acute infarction,  hemorrhage, hydrocephalus, extra-axial collection or mass lesion. No pathologic intracranial enhancement. Vascular: See below Skull and upper cervical spine: Normal marrow signal. Sinuses/Orbits: No acute or significant finding. Other: No mastoid effusions. MRA HEAD FINDINGS Motion limited study.  Within this limitation: Anterior circulation: Bilateral intracranial ICAs, MCAs, and ACAs are patent without proximal high-grade stenosis. No aneurysm identified. Posterior circulation: Left dominant intradural vertebral artery. Bilateral intradural vertebral arteries, basilar artery, and posterior cerebral arteries are patent without proximal high-grade stenosis. Bilateral superior cerebellar arteries and Picas are patent proximally. MRA NECK FINDINGS Aortic arch: Great vessel origins are patent. Limited assessment for stenosis given artifact. Right carotid system: Patent. Mild narrowing at the carotid bifurcation. Left carotid system: Patent. Moderate narrowing of the left ICA origin. Vertebral arteries: Patent bilaterally. Mildly left dominant. No evidence of significant (greater than 50%) stenosis. Other: None IMPRESSION: MRI: No evidence of acute intracranial abnormality. MRA head: Motion study without large vessel occlusion or proximal high-grade stenosis. MRA neck 1. Moderate narrowing of the left ICA origin. CTA could provide more accurate quantification of stenosis if clinically indicated. 2. Otherwise, major arteries are patent in the neck without significant stenosis. Electronically Signed   By: FMargaretha SheffieldM.D.   On: 05/31/2021 18:30   MR Angiogram Neck W or Wo Contrast  Result Date: 05/31/2021 CLINICAL DATA:  Neuro deficit, acute, stroke suspected; Stroke/TIA, determine embolic source EXAM: MRI HEAD WITHOUT CONTRAST MRA HEAD WITHOUT CONTRAST MRA NECK WITHOUT AND WITH CONTRAST TECHNIQUE: Multiplanar, multi-echo pulse sequences of the brain and surrounding structures were  acquired without intravenous  contrast. Angiographic images of the Circle of Willis were acquired using MRA technique without intravenous contrast. Angiographic images of the neck were acquired using MRA technique without and with intravenous contrast. Carotid stenosis measurements (when applicable) are obtained utilizing NASCET criteria, using the distal internal carotid diameter as the denominator. CONTRAST:  67m GADAVIST GADOBUTROL 1 MMOL/ML IV SOLN COMPARISON:  Same day CT head. FINDINGS: MRI HEAD FINDINGS Brain: No acute infarction, hemorrhage, hydrocephalus, extra-axial collection or mass lesion. No pathologic intracranial enhancement. Vascular: See below Skull and upper cervical spine: Normal marrow signal. Sinuses/Orbits: No acute or significant finding. Other: No mastoid effusions. MRA HEAD FINDINGS Motion limited study.  Within this limitation: Anterior circulation: Bilateral intracranial ICAs, MCAs, and ACAs are patent without proximal high-grade stenosis. No aneurysm identified. Posterior circulation: Left dominant intradural vertebral artery. Bilateral intradural vertebral arteries, basilar artery, and posterior cerebral arteries are patent without proximal high-grade stenosis. Bilateral superior cerebellar arteries and Picas are patent proximally. MRA NECK FINDINGS Aortic arch: Great vessel origins are patent. Limited assessment for stenosis given artifact. Right carotid system: Patent. Mild narrowing at the carotid bifurcation. Left carotid system: Patent. Moderate narrowing of the left ICA origin. Vertebral arteries: Patent bilaterally. Mildly left dominant. No evidence of significant (greater than 50%) stenosis. Other: None IMPRESSION: MRI: No evidence of acute intracranial abnormality. MRA head: Motion study without large vessel occlusion or proximal high-grade stenosis. MRA neck 1. Moderate narrowing of the left ICA origin. CTA could provide more accurate quantification of stenosis if clinically indicated. 2. Otherwise, major  arteries are patent in the neck without significant stenosis. Electronically Signed   By: FMargaretha SheffieldM.D.   On: 05/31/2021 18:30   MR BRAIN WO CONTRAST  Result Date: 05/31/2021 CLINICAL DATA:  Neuro deficit, acute, stroke suspected; Stroke/TIA, determine embolic source EXAM: MRI HEAD WITHOUT CONTRAST MRA HEAD WITHOUT CONTRAST MRA NECK WITHOUT AND WITH CONTRAST TECHNIQUE: Multiplanar, multi-echo pulse sequences of the brain and surrounding structures were acquired without intravenous contrast. Angiographic images of the Circle of Willis were acquired using MRA technique without intravenous contrast. Angiographic images of the neck were acquired using MRA technique without and with intravenous contrast. Carotid stenosis measurements (when applicable) are obtained utilizing NASCET criteria, using the distal internal carotid diameter as the denominator. CONTRAST:  117mGADAVIST GADOBUTROL 1 MMOL/ML IV SOLN COMPARISON:  Same day CT head. FINDINGS: MRI HEAD FINDINGS Brain: No acute infarction, hemorrhage, hydrocephalus, extra-axial collection or mass lesion. No pathologic intracranial enhancement. Vascular: See below Skull and upper cervical spine: Normal marrow signal. Sinuses/Orbits: No acute or significant finding. Other: No mastoid effusions. MRA HEAD FINDINGS Motion limited study.  Within this limitation: Anterior circulation: Bilateral intracranial ICAs, MCAs, and ACAs are patent without proximal high-grade stenosis. No aneurysm identified. Posterior circulation: Left dominant intradural vertebral artery. Bilateral intradural vertebral arteries, basilar artery, and posterior cerebral arteries are patent without proximal high-grade stenosis. Bilateral superior cerebellar arteries and Picas are patent proximally. MRA NECK FINDINGS Aortic arch: Great vessel origins are patent. Limited assessment for stenosis given artifact. Right carotid system: Patent. Mild narrowing at the carotid bifurcation. Left carotid  system: Patent. Moderate narrowing of the left ICA origin. Vertebral arteries: Patent bilaterally. Mildly left dominant. No evidence of significant (greater than 50%) stenosis. Other: None IMPRESSION: MRI: No evidence of acute intracranial abnormality. MRA head: Motion study without large vessel occlusion or proximal high-grade stenosis. MRA neck 1. Moderate narrowing of the left ICA origin. CTA could provide more accurate quantification of stenosis if  clinically indicated. 2. Otherwise, major arteries are patent in the neck without significant stenosis. Electronically Signed   By: Margaretha Sheffield M.D.   On: 05/31/2021 18:30     Orvis Brill, DO 06/01/2021, 9:34 AM PGY-1, [place] Intern pager: [phone], text pages welcome

## 2021-06-01 NOTE — Progress Notes (Signed)
Patient stated he quit ETOH and cocaine use years ago. UDS positive cocaine , again patient stated he has quit years ago and did not want any resources  ?

## 2021-06-01 NOTE — Progress Notes (Signed)
Carotid and ABI completed. Refer to [facility] under chart review to view preliminary results.  ? ?06/01/2021  1:17 PM ?Michael Chambers ? ? ?

## 2021-06-01 NOTE — TOC Initial Note (Addendum)
Transition of Care (TOC) - Initial/Assessment Note  ? ? ?Patient Details  ?Name: Michael Chambers ?MRN: [id number] ?Date of Birth: 05/15/1960 ? ?Transition of Care (TOC) CM/SW Contact:    ?Marilu Favre, RN ?Phone Number: ?06/01/2021, 3:43 PM ? ?Clinical Narrative:                 ?Spoke to patient at bedside , confirmed face sheet information.  ? ?OT recommending OP OT for low vision. Patient lives in [place] but wants to go to OP OT in [place].  ? ?NCM researched OP OT locations in [place] at bedside with patient and called multiple locations, most do not offer OT for low vision.  ? ?NCM called [facility] [phone] at [place], [place] [address] , [place], [place] and spoke to receptionist [place] and [place]. They can provide OP OT for low vision, patient knows address and wants to go there. MAry will fax a referral form form for MD to sign. ? ?[employer] MD signed and NCM faxed form. Information placed on AVS  ?Also had a consult for substance abuse counseling and resources . Patient states he quit drinking ETOH and using cocaine "years ago". NCM stated UDS was positive for cocaine. Patient stated he quit years ago  ? ?Patient has Medicaid . NCM provided Medicaid transportation number for patient to call to arrange transportation to appointments.  ? ?Patient voiced understanding  ? ?Patient has tub bench  ? ?Expected Discharge Plan: Home/Self Care ?Barriers to Discharge: Continued Medical Work up ? ? ?Patient Goals and CMS Choice ?Patient states their goals for this hospitalization and ongoing recovery are:: to return to home ?CMS Medicare.gov Compare Post Acute Care list provided to:: Patient ?Choice offered to / list presented to : Patient ? ?Expected Discharge Plan and Services ?Expected Discharge Plan: Home/Self Care ?  ?Discharge Planning Services: CM Consult ?Post Acute Care Choice:  (OP OT for low vision) ?Living arrangements for the past 2 months: [place] ?                ?DME  Arranged: N/A ?  ?  ?  ?  ?HH Arranged: NA ?  ?  ?  ?  ? ?Prior Living Arrangements/Services ?Living arrangements for the past 2 months: [place] ?Lives with:: Self ?Patient language and need for interpreter reviewed:: Yes ?Do you feel safe going back to the place where you live?: Yes      ?  ?  ?  ?  ? ?Activities of Daily Living ?Home Assistive Devices/Equipment: None ?ADL Screening (condition at time of admission) ?Patient's cognitive ability adequate to safely complete daily activities?: Yes ?Is the patient deaf or have difficulty hearing?: No ?Does the patient have difficulty seeing, even when wearing glasses/contacts?: No (new onset visual loss to L eye, R eye vision ok) ?Does the patient have difficulty concentrating, remembering, or making decisions?: No ?Patient able to express need for assistance with ADLs?: Yes ?Does the patient have difficulty dressing or bathing?: No ?Independently performs ADLs?: Yes (appropriate for developmental age) ?Does the patient have difficulty walking or climbing stairs?: No ?Weakness of Legs: None ?Weakness of Arms/Hands: None ? ?Permission Sought/Granted ?  ?Permission granted to share information with : No ?   ?   ?   ?   ? ?Emotional Assessment ?Appearance:: Appears stated age ?Attitude/Demeanor/Rapport: Engaged ?Affect (typically observed): Accepting ?Orientation: : Oriented to Self, Oriented to Place, Oriented to  Time,  Oriented to Situation ?Alcohol / Substance Use: Not Applicable ?Psych Involvement: No (comment) ? 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 Diagnosis Date Noted  ? Vision loss of left eye 05/31/2021  ? Encounter for loop recorder check 12/20/2019  ? Loop recorder in situ: Biotronik Loop Recorder. Serial # [id number] 12/20/19 12/20/2019  ? Typical atrial flutter ([place]) 12/20/2019  ? PSVT (paroxysmal supraventricular tachycardia) ([place]) 12/20/2019  ?  Guaiac positive stools 03/01/2016  ? Coronary artery disease   ? Lymphadenopathy 09/14/2010  ? Shortness of breath   ? Tobacco abuse   ? Other and unspecified hyperlipidemia   ? Unspecified essential hypertension   ? ?PCP:  Monico Blitz, MD ?Pharmacy:   ?[facility] ?[place] [address] ?Phone: [phone] Fax: [phone] ? ? ? ? ?Social Determinants of Health (SDOH) Interventions ?  ? ?Readmission Risk Interventions ?No flowsheet data found. ? ? ?

## 2021-06-02 ENCOUNTER — Inpatient Hospital Stay (HOSPITAL_COMMUNITY): Payer: Medicare Other

## 2021-06-02 MED ORDER — IOHEXOL 350 MG/ML SOLN
60.0000 mL | Freq: Once | INTRAVENOUS | Status: AC | PRN
  Administered 2021-06-02: 60 mL via INTRAVENOUS

## 2021-06-02 NOTE — Progress Notes (Signed)
Family Medicine Teaching Service ?Daily Progress Note ?Intern Pager: [phone] ? ?[name] Medical record number: [id number] ?Date of birth: [birth year] Age: 61 y.o. Gender: male ? ?Primary Care Provider: Monico Blitz, MD ?Consultants: Neurology, Vascular surgery ?Code Status: DNR ? ?Pt Overview and Major Events to Date:  ?3/6- Admitted, MRA shows left ICA stenosis >60% ?3/7- VVS consulted ? ?Assessment and Plan: ?Michael Chambers is a 61 year-old gentleman here with CRAO, now diagnosed symptomatic left ICA stenosis with plan for carotid endarterectomy. ? ?CRAO with symptomatic left carotid stenosis ?Feeling well today- continues to deny any changes in neurologic status including dysphasia, weakness, changes in vision other than that of the left eye. CT angiogram yesterday showed 60% left ICA stenosis, as well as significant calcific disease which extends into common carotid artery proximally, per vascular surgery. Plan for carotid endarterectomy surgery Friday. Meningioma seen on imaging, with recommendation for postcontrast brain MRI, which can be worked up in the near future. ?PT with no further follow-up recommended ?- Holding Plavix per VVS ?- Continue ASA ?- Continue rosuvastatin ? ?Hx atrial arrhythmia  (flutter vs. fibrillation vs. PSVT) ?NSR. HR within normal limits. No overnight events. ?- Monitor VS ?- Continue diltiazem '180mg'$  daily ? ?Concern for osteomyelitis of left 3rd toe on MRI ?Foot appears significantly improved on exam today with less edema, granulation tissue noted on healing superficial ulcer of 3rd toe. VVS recommended further outpatient follow-up. Remains afebrile, well-appearing. ?- Wound care ?- Continue to monitor ?- If fever/ worsening sign of infection, start antibiotics and consult VVS or orthopedic surgery ? ?PAD; infrainguinal arterial occlusive disease ?Will need further workup eventually with aortogram, has infrainguinal arterial occlusive disease. CHADS2VASC score of  4, will likely need anticoagulation at discharge but will defer to VVS for recommendations. ?- Outpatient follow/up for workup of PAD ?- Continue gabapentin '100mg'$  BID ? ?Hx substance use; cocaine and ETOH ?Patient told TOC he has not taken cocaine or drank ETOH in years, despite UDS + for cocaine on admission ?- Provided with cessation information/resources ? ?Other conditions chronic and stable. Continue current regimen ?Hypertension, Chronic back pain, CAD ? ?FEN/GI: Heart healthy diet ?PPx: Lovenox ?Dispo:Home  pending surgery Friday . Barriers include plan for carotid endarterectomy Friday.  ? ?Subjective:  ?Feeling well with no complaints. In good spirits. Says his left foot seems a lot better with less pain, swelling and warmth. Feeling ready for surgery Friday. ? ?Objective: ?Temp:  [97.4 ?F (36.3 ?C)-98.1 ?F (36.7 ?C)] 97.8 ?F (36.6 ?C) (03/08 1478) ?Pulse Rate:  [69-76] 73 (03/08 0824) ?Resp:  [18-20] 18 (03/08 0824) ?BP: (107-137)/(66-82) 107/66 (03/08 2956) ?SpO2:  [92 %-95 %] 95 % (03/08 0824) ?Physical Exam: ?General: Pleasant, sitting at edge of bed, pleasant, in no distress ?Cardiovascular: RRR ?Respiratory: Normal work of breathing on room air. Clear without wheezing or crackles. ?Extremities: 1-2+ pitting edema with chronic venous stasis. Improved warmth, swelling and erythema over L + R lower legs with L>R edema. Left 3rd toe with healing superficial ulcer improved from yesterday. ? ?Laboratory: ?Recent Labs  ?Lab 05/31/21 ?1117 06/01/21 ?0052  ?WBC 9.7 9.1  ?HGB 17.3* 15.8  ?HCT 52.3* 47.6  ?PLT 197 182  ? ?Recent Labs  ?Lab 05/31/21 ?1117 06/01/21 ?0052  ?NA 140 138  ?K 4.1 4.0  ?CL 107 106  ?CO2 21* 24  ?BUN 16 18  ?CREATININE 1.06 1.17  ?CALCIUM 9.1 8.9  ?PROT 6.4*  --   ?BILITOT 0.4  --   ?ALKPHOS 90  --   ?  ALT 23  --   ?AST 24  --   ?GLUCOSE 151* 121*  ? ? ?Imaging/Diagnostic Tests: ?CT ANGIO NECK W OR WO CONTRAST ? ?Result Date: 06/02/2021 ?CLINICAL DATA:  Carotid artery stenosis EXAM: CT  ANGIOGRAPHY NECK TECHNIQUE: Multidetector CT imaging of the neck was performed using the standard protocol during bolus administration of intravenous contrast. Multiplanar CT image reconstructions and MIPs were obtained to evaluate the vascular anatomy. Carotid stenosis measurements (when applicable) are obtained utilizing NASCET criteria, using the distal internal carotid diameter as the denominator. RADIATION DOSE REDUCTION: This exam was performed according to the departmental dose-optimization program which includes automated exposure control, adjustment of the mA and/or kV according to patient size and/or use of iterative reconstruction technique. CONTRAST:  64m OMNIPAQUE IOHEXOL 350 MG/ML SOLN COMPARISON:  Neck MRA from 2 days ago FINDINGS: Aortic arch: Atheromatous plaque with 3 vessel branching. Right carotid system: Diffuse mixed density atheromatous plaque on the common carotid and accentuated at the bifurcation where ICA origin stenosis measures up to 35%. No ulceration or dissection. Heavily calcified carotid siphons. Left carotid system: Atheromatous plaque at the common carotid origin and distal segments without flow limiting stenosis. Accentuated mainly calcified plaque at the bifurcation with ICA origin stenosis measured on coronal reformats at 40%. Heavily calcified carotid siphon. Vertebral arteries: Proximal subclavian atherosclerosis on both sides. Low-density plaque on the upper wall of the left subclavian artery causes 30% stenosis as measured on coronal reformats. Atheromatous plaque at the left vertebral origin causes 50% stenosis. The left vertebral artery is mildly dominant. Skeleton: Generalized degenerative disc narrowing and endplate ridging. Other neck: No acute finding Upper chest: Coronary atherosclerosis. High-density mass measuring 17 x 9 mm at the upper right Meckel's cave, usually meningioma. IMPRESSION: 1. Diffuse atherosclerosis with ICA origin stenosis measuring 40% on the  left and 35% on the right. 2. Atheromatous narrowing of the left vertebral origin measuring 50%. 3. 17 x 9 mm mass at the upper right Meckel's cave, usually meningioma. Recommend postcontrast brain MRI Electronically Signed   By: JJorje GuildM.D.   On: 06/02/2021 04:14   ? ? ?DOrvis Brill DO ?06/02/2021, 8:51 AM ?PGY-1, [facility] ?FL[facility] DoloresIntern pager: [phone] text pages welcome ? ?

## 2021-06-02 NOTE — TOC Progression Note (Addendum)
Transition of Care (TOC) - Progression Note  ? ? ?Patient Details  ?Name: Michael Chambers ?MRN: [id number] ?Date of Birth: 09/11/1960 ? ?Transition of Care (TOC) CM/SW Contact  ?Marilu Favre, RN ?Phone Number: ?06/02/2021, 1:48 PM ? ?Clinical Narrative:    ? ?Mary from [facility] called  back , order for OP PT  will need to come from patient's PCP Dr Manuella Ghazi . They are asking if a hospital doctor could the PCP and ask the PCP to send them an order . NCM sent MD secure chat. Dr Rob Bunting will reach out to PCP  ? ?Expected Discharge Plan: Home/Self Care ?Barriers to Discharge: Continued Medical Work up ? ?Expected Discharge Plan and Services ?Expected Discharge Plan: Home/Self Care ?  ?Discharge Planning Services: CM Consult ?Post Acute Care Choice:  (OP OT for low vision) ?Living arrangements for the past 2 months: [place] ?                ?DME Arranged: N/A ?  ?  ?  ?  ?HH Arranged: NA ?  ?  ?  ?  ? ? ?Social Determinants of Health (SDOH) Interventions ?  ? ?Readmission Risk Interventions ?No flowsheet data found. ? ?

## 2021-06-02 NOTE — Progress Notes (Signed)
OT Cancellation Note ? ?Patient Details ?Name: Michael Chambers ?MRN: [id number] ?DOB: 01/02/1961 ? ? ?Cancelled Treatment:    Reason Eval/Treat Not Completed: Fatigue/lethargy limiting ability to participate.  Pt sleeping soundly. ? ?Pura Picinich C., OTR/L ?Acute Rehabilitation Services ?Pager [phone] ?Office [phone] ? ? ?Chales Pelissier M ?06/02/2021, 1:20 PM ?

## 2021-06-02 NOTE — Progress Notes (Signed)
Mobility Specialist Progress Note: ? ? 06/02/21 1022  ?Therapy Vitals  ?BP 120/68  ?Mobility  ?Activity Ambulated with assistance in hallway  ?Level of Assistance Independent  ?Assistive Device Front wheel walker  ?Distance Ambulated (ft) 570 ft  ?Activity Response Tolerated well  ?$Mobility charge 1 Mobility  ? ?Pt received EOB willing to participate in mobility. No complaints of pain. Left EOB with call bell in reach and all needs met.  ? ?Michael Chambers ?Mobility Specialist ?Primary Phone [phone] ? ?

## 2021-06-02 NOTE — Plan of Care (Signed)
?  Problem: Education: ?Goal: Knowledge of disease or condition will improve ?Outcome: Progressing ?Note: Given information Carotid Endarectomy ?Goal: Understanding of medication regimen will improve ?Outcome: Progressing ?Note: Given information on Carotid Endarectomy ?  ?Problem: Activity: ?Goal: Ability to tolerate increased activity will improve ?Outcome: Progressing ?  ?Problem: Cardiac: ?Goal: Ability to achieve and maintain adequate cardiopulmonary perfusion will improve ?Outcome: Progressing ?  ?Problem: Health Behavior/Discharge Planning: ?Goal: Ability to safely manage health-related needs after discharge will improve ?Outcome: Progressing ?  ?

## 2021-06-02 NOTE — Progress Notes (Addendum)
STROKE TEAM PROGRESS NOTE   INTERVAL HISTORY No family at the bedside.  Patient resting in bed, he states he did not sleep well last night.  Vascular surgery plans for a left carotid endarterectomy on Friday given the results of the CTA and carotid ultrasound.  Vitals:   06/01/21 2118 06/02/21 0824 06/02/21 1022 06/02/21 1500  BP: 135/82 107/66 120/68 123/76  Pulse: 69 73  80  Resp: '20 18  17  '$ Temp: 98.1 F (36.7 C) 97.8 F (36.6 C)  98 F (36.7 C)  TempSrc: Oral Oral  Oral  SpO2: 92% 95%  96%  Weight:      Height:       CBC:  Recent Labs  Lab 05/31/21 1117 06/01/21 0052  WBC 9.7 9.1  NEUTROABS 5.8 4.2  HGB 17.3* 15.8  HCT 52.3* 47.6  MCV 92.1 91.4  PLT 197 371    Basic Metabolic Panel:  Recent Labs  Lab 05/31/21 1117 06/01/21 0052  NA 140 138  K 4.1 4.0  CL 107 106  CO2 21* 24  GLUCOSE 151* 121*  BUN 16 18  CREATININE 1.06 1.17  CALCIUM 9.1 8.9  MG  --  2.1    Lipid Panel:  Recent Labs  Lab 06/01/21 0052  CHOL 157  TRIG 214*  HDL 32*  CHOLHDL 4.9  VLDL 43*  LDLCALC 82    HgbA1c:  Recent Labs  Lab 05/31/21 1949  HGBA1C 5.6    Urine Drug Screen:  Recent Labs  Lab 05/31/21 1130  LABOPIA NONE DETECTED  COCAINSCRNUR POSITIVE*  LABBENZ NONE DETECTED  AMPHETMU NONE DETECTED  THCU NONE DETECTED  LABBARB NONE DETECTED     Alcohol Level  Recent Labs  Lab 05/31/21 1117  ETH <10     IMAGING past 24 hours CT ANGIO NECK W OR WO CONTRAST  Result Date: 06/02/2021 CLINICAL DATA:  Carotid artery stenosis EXAM: CT ANGIOGRAPHY NECK TECHNIQUE: Multidetector CT imaging of the neck was performed using the standard protocol during bolus administration of intravenous contrast. Multiplanar CT image reconstructions and MIPs were obtained to evaluate the vascular anatomy. Carotid stenosis measurements (when applicable) are obtained utilizing NASCET criteria, using the distal internal carotid diameter as the denominator. RADIATION DOSE REDUCTION: This exam  was performed according to the departmental dose-optimization program which includes automated exposure control, adjustment of the mA and/or kV according to patient size and/or use of iterative reconstruction technique. CONTRAST:  31m OMNIPAQUE IOHEXOL 350 MG/ML SOLN COMPARISON:  Neck MRA from 2 days ago FINDINGS: Aortic arch: Atheromatous plaque with 3 vessel branching. Right carotid system: Diffuse mixed density atheromatous plaque on the common carotid and accentuated at the bifurcation where ICA origin stenosis measures up to 35%. No ulceration or dissection. Heavily calcified carotid siphons. Left carotid system: Atheromatous plaque at the common carotid origin and distal segments without flow limiting stenosis. Accentuated mainly calcified plaque at the bifurcation with ICA origin stenosis measured on coronal reformats at 40%. Heavily calcified carotid siphon. Vertebral arteries: Proximal subclavian atherosclerosis on both sides. Low-density plaque on the upper wall of the left subclavian artery causes 30% stenosis as measured on coronal reformats. Atheromatous plaque at the left vertebral origin causes 50% stenosis. The left vertebral artery is mildly dominant. Skeleton: Generalized degenerative disc narrowing and endplate ridging. Other neck: No acute finding Upper chest: Coronary atherosclerosis. High-density mass measuring 17 x 9 mm at the upper right Meckel's cave, usually meningioma. IMPRESSION: 1. Diffuse atherosclerosis with ICA origin stenosis measuring 40% on the  left and 35% on the right. 2. Atheromatous narrowing of the left vertebral origin measuring 50%. 3. 17 x 9 mm mass at the upper right Meckel's cave, usually meningioma. Recommend postcontrast brain MRI Electronically Signed   By: Jorje Guild M.D.   On: 06/02/2021 04:14    PHYSICAL EXAM  Physical Exam  Constitutional: Appears well-developed and well-nourished.  Cardiovascular: Normal rate and regular rhythm.  Respiratory: Effort  normal, non-labored breathing Skin: Peripheral arterial disease, wound on left third toe  Neuro: Mental Status: Patient is awake, alert, oriented to person, place, month, year, and situation. Patient is able to give a clear and coherent history. No signs of aphasia or neglect Cranial Nerves: II: Pupils are equal, round, and reactive to light. No visual field deficits in right eye. Tubular vision in the left eye-  pinhole area of vision in the mid left upper visual field III,IV, VI: EOMI without ptosis or diploplia.  V: Facial sensation is symmetric to temperature VII: Facial movement is symmetric resting and smiling VIII: Hearing is intact to voice X: Palate elevates symmetrically XI: Shoulder shrug is symmetric. XII: Tongue protrudes midline without atrophy or fasciculations.  Motor: 5/5 strength was present in bilateral lower extremities and left upper extremity.  Baseline contracture of right upper extremity, increased tone in right upper extremity Sensation is symmetric to light touch and temperature in the arms and legs. No extinction to DSS present.  Cerebellar: FNF intact in relation to weakness/contracture    ASSESSMENT/PLAN Michael Chambers is a 61 y.o. male with history of CAD, atrial arrhythmia, loop recorder placement, hypertension, peripheral arterial disease, depression, and hyperlipidemia presenting with monocular vision loss.  Referred by ophthalmology after a confirmed central retinal arterial occlusion at an appointment on 05/31/2021.  Vascular surgery consulted for symptomatic left ICA stenosis and peripheral arterial disease with possible osteomyelitis and left third toe.  Plan for left carotid endarterectomy with Dr. Scot Dock on Friday due to symptomatic left ICA stenosis.  CRAO likely resulting from left carotid stenosis/high risk plaque Code Stroke CT head No acute abnormality. Small vessel disease.  CTA head & neck 60% left CCA/ICA stenosis per vascular  surgery MRI no evidence of acute intracranial abnormality MRA  moderate narrowing of the left ICA Carotid Doppler  The ECA appears >50% stenosed. 2D Echo EF is 60-65%, no shunt detected by color-flow Doppler LDL 82 HgbA1c 5.6 VTE prophylaxis -SCDs No antithrombotic prior to admission, now on aspirin 81 mg daily and clopidogrel 75 mg daily.  Hold Plavix in anticipation of left CEA on Friday Therapy recommendations: OP OT Disposition:  Pending  Hypertension Stable BP goal 130-160 before left CEA Long-term BP goal normotensive  Hyperlipidemia Home meds:  Crestor '10mg'$ , resumed in hospital LDL 82, goal < 70 Increase Crestor '20mg'$   Continue statin at discharge  Other Stroke Risk Factors Advanced Age >/= 52  Cigarette smoker, advised to stop smoking 0.5ppd Substance abuse - UDS Cocaine POSITIVE. Patient advised to stop using due to stroke risk. Obesity, Body mass index is 32.62 kg/m., BMI >/= 30 associated with increased stroke risk, recommend weight loss, diet and exercise as appropriate  Coronary artery disease  Other Active Problems Atrial Arrhythmia Diltiazem '180mg'$ , rate is controlled Peripheral arterial disease Vascular surgery to work up outpatient Possible osteomyelitis of the left 3rd toe, further imaging is in process Major depressive disorder On Cymbalta, consider psychiatry consult per primary team  Hospital day # 2  Patient seen and examined by NP/APP with MD. MD to  update note as needed.   Janine Ores, DNP, FNP-BC [facility] Pager: [phone]   ATTENDING NOTE: I reviewed above note and agree with the assessment and plan. Pt was seen and examined.   No acute event overnight, neuro stable still has left eye vision loss.  CT head and neck showed left CCA/ICA 60% stenosis with high risk plaque.  Dr. Scot Dock VVS plan for left CEA Friday.  Continue aspirin and hold off Plavix in anticipation of left CEA.  Continue statin.  Cocaine and tobacco  cessation education provided.  For detailed assessment and plan, please refer to above as I have made changes wherever appropriate.   Neurology will sign off. Please call with questions. Pt will follow up with stroke clinic NP at [facility] in about 4 weeks. Thanks for the consult.   Rosalin Hawking, MD PhD Stroke Neurology 06/02/2021 6:42 PM    To contact Stroke Continuity provider, please refer to [email or web address] After hours, contact General Neurology

## 2021-06-02 NOTE — Progress Notes (Addendum)
? ?  VASCULAR SURGERY ASSESSMENT & PLAN:  ? ?SYMPTOMATIC LEFT CAROTID STENOSIS: This patient has asymptomatic left carotid stenosis.  I reviewed his CT angiogram from today and by my interpretation he has at least a 60% left ICA stenosis.  This is seen best on series 6 image 85 and series 8 image 141.  Peak systolic velocity on duplex on the left side is 185 cm/s which would again suggest that the stenosis is significant.  I have recommended left carotid endarterectomy and this is scheduled for Friday.  I have again reviewed the indications for the procedure and the potential complications and he is agreeable to proceed.  He is on aspirin, Plavix, and a statin.  I am going to hold his Plavix prior to surgery starting today. ?  ?PERIPHERAL ARTERIAL DISEASE: He does have infrainguinal arterial occlusive disease on exam.  He will eventually need an aortogram to further evaluate this but currently I think the symptomatic left carotid stenosis is the more pressing issue.  I think we need to get him through that and then can arrange for work-up of his peripheral arterial disease as an outpatient in the future. ? 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 Recommend postcontrast brain MRI."  ? ? ?SUBJECTIVE:  ? ?No specific complaints this morning. ? ?PHYSICAL EXAM:  ? ?Vitals:  ? 06/01/21 0408 06/01/21 0829 06/01/21 1636 06/01/21 2118  ?BP:  133/67 137/81 135/82  ?Pulse:  66 76 69  ?Resp:  '18 18 20  '$ ?Temp:  97.8 ?F (36.6 ?C) (!) 97.4 ?F (36.3 ?C) 98.1 ?F (36.7 ?C)  ?TempSrc:    Oral  ?SpO2: 99% 96% 95% 92%  ?Weight:      ?Height:      ? ?No focal weakness or paresthesias. ? ?LABS:  ? ?ECHO: His echo shows a left ventricular ejection fraction of 60 to 65%.  The left ventricle has normal function. ? ?CT ANGIOGRAM NECK: This shows at least a 60% left carotid stenosis.  This is best seen on series 6 image 85 and series 8 image 141.  He has  significant calcific disease which extends well into his common carotid artery proximally. ? ?Lab Results  ?Component Value Date  ? WBC 9.1 06/01/2021  ? HGB 15.8 06/01/2021  ? HCT 47.6 06/01/2021  ? MCV 91.4 06/01/2021  ? PLT 182 06/01/2021  ? ?Lab Results  ?Component Value Date  ? CREATININE 1.17 06/01/2021  ? ?Lab Results  ?Component Value Date  ? INR 1.0 05/31/2021  ? ? ?PROBLEM LIST:   ? ?Principal Problem: ?  Vision loss of left eye ?Active Problems: ?  Smoker ?  Central retinal artery occlusion of left eye ?  Cerebrovascular disease ?  Stenosis of left carotid artery ?  Cocaine abuse ([place]) ? ? ?CURRENT MEDS:  ? ? aspirin EC  81 mg Oral Daily  ? clopidogrel  75 mg Oral Daily  ? diltiazem  180 mg Oral Daily  ? DULoxetine  60 mg Oral Daily  ? gabapentin  100 mg Oral BID  ? hydrocerin   Topical Daily  ? nicotine  21 mg Transdermal Daily  ? rosuvastatin  20 mg Oral QHS  ? ? ?Michael Chambers ?Office: [phone] ?06/02/2021 ? ?

## 2021-06-03 LAB — SURGICAL PCR SCREEN
MRSA, PCR: NEGATIVE
Staphylococcus aureus: NEGATIVE

## 2021-06-03 NOTE — Progress Notes (Signed)
FPTS Interim Night Progress Note ? ?S:Patient sleeping comfortably.  Rounded with primary night RN.  No concerns voiced.  No orders required.   ? ?O: ?Today's Vitals  ? 06/02/21 1500 06/02/21 2006 06/02/21 2102 06/03/21 0012  ?BP: 123/76 122/72  110/65  ?Pulse: 80   71  ?Resp: '17 18  18  '$ ?Temp: 98 ?F (36.7 ?C) 97.6 ?F (36.4 ?C)  97.8 ?F (36.6 ?C)  ?TempSrc: Oral Oral  Oral  ?SpO2: 96% 97%  97%  ?Weight:      ?Height:      ?PainSc:  8  Asleep   ? ? ? ? ?A/P: ?Continue current management ? ?Carollee Leitz MD ?PGY-3, [facility] ?Service pager [phone]   ?

## 2021-06-03 NOTE — Progress Notes (Addendum)
Family Medicine Teaching Service ?Daily Progress Note ?Intern Pager: [phone] ? ?[name] Medical record number: [id number] ?Date of birth: [birth year] Age: 61 y.o. Gender: male ? ?Primary Care Provider: Monico Blitz, MD ?Consultants: Neurology, VVS ?Code Status: DNR ? ?Pt Overview and Major Events to Date:  ?3/6- Admitted, MRA with left ICA stenosis >60% ?3/7- VVS consulted ?3/8- Scheduled for left carotid endarterectomy ? ?Assessment and Plan: ?Michael Chambers. Michael Chambers is a 61 year-old male with CRAO and symptomatic left carotid artery stenosis/high risk plaque disease with plan for carotid endarterectomy tomorrow with  VVS. ? ?CRAO, symptomatic left carotid stenosis/high risk plaque disease ?HLD ?Feeling well, no change in neuro exam today including weakness, changes in vision other than that of left eye which is stable. Scheduled for left carotid endarterectomy tomorrow with VVS.  ?- SCDs for VTE prophylaxis ?- ASA 81 mg, Clopidogrel '75mg'$  daily ?- Continue Crestor '20mg'$  ?- Hold plavix in anticipation of left CEA ?- VVS following, appreciate care ?- Neurology and stroke team following, appreciate recommendations ?- No PT follow up ?- Will set up OT f/u outpatient ? ?Hypertension ?BP stable, very well-controlled. Goal systolic 786-767 prior to left CEA, per stroke team. ?- Continue diltiazem ?- Monitor BP, keep within parameters, can give IV hydralazine if exceeds upper limit ? ?Concern for osteomyelitis of left 3rd toe on MRI ?Foot appears significantly improved on exam today with less edema, granulation tissue noted on healing superficial ulcer of 3rd toe. VVS recommended further outpatient follow-up. Remains afebrile, well-appearing. ?- Wound care ?- Continue to monitor ?- If fever/ worsening sign of infection, start antibiotics and consult VVS or orthopedic surgery ? ?Hx unspecified atrial arrhythmia, loop recorder ?Has remained in NSR. Rate controlled. No overnight events ?- Monitor VS ?- Diltiazem  180 mg daily ? ?Other conditions for outpatient workup ?PAD- per VVS ?Meningioma- potential MRI ?Substance use- cessation ? ?FEN/GI: Heart healthy diet, NPO at midnight ?PPx: Lovenox ?Dispo:Home  pending surgery tomorrow . Barriers include inpatient surgery.  ? ?Subjective:  ?Feeling well, in good spirits. Had questions regarding what caused his vision loss which I answered for him. He says his feet are doing better daily in terms of swelling and tenderness. ? ?Objective: ?Temp:  [97.6 ?F (36.4 ?C)-98 ?F (36.7 ?C)] 97.9 ?F (36.6 ?C) (03/09 0831) ?Pulse Rate:  [71-80] 76 (03/09 0831) ?Resp:  [17-20] 19 (03/09 0831) ?BP: (110-123)/(65-80) 121/80 (03/09 0831) ?SpO2:  [95 %-100 %] 95 % (03/09 0831) ?Physical Exam: ?General: Pleasant, sitting on edge of bed, pleasant in no distress ?Cardiovascular: RRR, 3/6 crescendo-decrescendo murmur heard best at RUS border ?Respiratory: Normal work of breathing on room air. Clear in all fields with no wheezing or crackles ?Abdomen: Abdomen soft, obese, non-tender ?Extremities: B/l feet in una boots ? ?Laboratory: ?Recent Labs  ?Lab 05/31/21 ?1117 06/01/21 ?0052  ?WBC 9.7 9.1  ?HGB 17.3* 15.8  ?HCT 52.3* 47.6  ?PLT 197 182  ? ?Recent Labs  ?Lab 05/31/21 ?1117 06/01/21 ?0052  ?NA 140 138  ?K 4.1 4.0  ?CL 107 106  ?CO2 21* 24  ?BUN 16 18  ?CREATININE 1.06 1.17  ?CALCIUM 9.1 8.9  ?PROT 6.4*  --   ?BILITOT 0.4  --   ?ALKPHOS 90  --   ?ALT 23  --   ?AST 24  --   ?GLUCOSE 151* 121*  ? ? ? ?Imaging/Diagnostic Tests: ?No results found. ? ? ?Michael Brill, DO ?06/03/2021, 9:21 AM ?PGY-1, [facility] ?[place] Intern pager: [phone], text pages  welcome ? ?
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
## 2021-06-03 NOTE — Progress Notes (Signed)
? ?  VASCULAR SURGERY ASSESSMENT & PLAN:  ? ?SYMPTOMATIC LEFT CAROTID STENOSIS: This patient has a 60% left ICA stenosis and had a left retinal infarct.  I have recommended left carotid endarterectomy to lower his risk of future stroke.  He has a focal calcific plaque with circumferential calcium so I think he would do best with carotid endarterectomy.  I have discussed with him the indications for the procedure and the potential complications including the perioperative risk of stroke (1 to 2%), nerve injury, MI, or other unpredictable medical problems.  He is on aspirin and is on a statin.  His Plavix is being held.  He is scheduled for surgery tomorrow. ? ?I have written preop orders. ? ?SUBJECTIVE:  ? ?No specific complaints today. ? ?PHYSICAL EXAM:  ? ?Vitals:  ? 06/02/21 1500 06/02/21 2006 06/03/21 0012 06/03/21 0424  ?BP: 123/76 122/72 110/65 114/67  ?Pulse: 80  71 71  ?Resp: '17 18 18 20  '$ ?Temp: 98 ?F (36.7 ?C) 97.6 ?F (36.4 ?C) 97.8 ?F (36.6 ?C) 97.9 ?F (36.6 ?C)  ?TempSrc: Oral Oral Oral Oral  ?SpO2: 96% 97% 97% 100%  ?Weight:      ?Height:      ? ?No focal weakness or paresthesias. ? ?LABS:  ? ?Lab Results  ?Component Value Date  ? WBC 9.1 06/01/2021  ? HGB 15.8 06/01/2021  ? HCT 47.6 06/01/2021  ? MCV 91.4 06/01/2021  ? PLT 182 06/01/2021  ? ?Lab Results  ?Component Value Date  ? CREATININE 1.17 06/01/2021  ? ?Lab Results  ?Component Value Date  ? INR 1.0 05/31/2021  ? ? ?PROBLEM LIST:   ? ?Principal Problem: ?  Vision loss of left eye ?Active Problems: ?  Smoker ?  Central retinal artery occlusion of left eye ?  Cerebrovascular disease ?  Stenosis of left carotid artery ?  Cocaine abuse ([place]) ? ? ?CURRENT MEDS:  ? ? aspirin EC  81 mg Oral Daily  ? diltiazem  180 mg Oral Daily  ? DULoxetine  60 mg Oral Daily  ? gabapentin  100 mg Oral BID  ? hydrocerin   Topical Daily  ? nicotine  21 mg Transdermal Daily  ? rosuvastatin  20 mg Oral QHS  ? ? ?Deitra Mayo ?Office: [phone] ?06/03/2021 ? ?

## 2021-06-03 NOTE — Progress Notes (Signed)
Mobility Specialist Progress Note: ? ? 06/03/21 0937  ?Mobility  ?Activity Ambulated with assistance in hallway  ?Level of Assistance Independent  ?Assistive Device None  ?Distance Ambulated (ft) 570 ft  ?Activity Response Tolerated well  ?$Mobility charge 1 Mobility  ? ?Pt received in chair willing to participate in mobility. No complaints of pain. Pt stated when we got back to his room that his legs felt sore. Left in chair with call bell in reach and all needs met.  ? ?Saavi Mceachron ?Mobility Specialist ?Primary Phone [phone] ? ?

## 2021-06-03 NOTE — Care Management Important Message (Signed)
Important Message ? ?Patient Details  ?Name: Michael Chambers ?MRN: [id number] ?Date of Birth: 1960-07-03 ? ? ?Medicare Important Message Given:  Yes ? ? ? ? ?Levada Dy  Kirin Pastorino-Martin ?06/03/2021, 11:32 AM ?

## 2021-06-03 NOTE — Plan of Care (Signed)
  Problem: Activity: Goal: Ability to tolerate increased activity will improve Outcome: Progressing   

## 2021-06-03 NOTE — H&P (View-Only) (Signed)
? ?  VASCULAR SURGERY ASSESSMENT & PLAN:  ? ?SYMPTOMATIC LEFT CAROTID STENOSIS: This patient has a 60% left ICA stenosis and had a left retinal infarct.  I have recommended left carotid endarterectomy to lower his risk of future stroke.  He has a focal calcific plaque with circumferential calcium so I think he would do best with carotid endarterectomy.  I have discussed with him the indications for the procedure and the potential complications including the perioperative risk of stroke (1 to 2%), nerve injury, MI, or other unpredictable medical problems.  He is on aspirin and is on a statin.  His Plavix is being held.  He is scheduled for surgery tomorrow. ? ?I have written preop orders. ? ?SUBJECTIVE:  ? ?No specific complaints today. ? ?PHYSICAL EXAM:  ? ?Vitals:  ? 06/02/21 1500 06/02/21 2006 06/03/21 0012 06/03/21 0424  ?BP: 123/76 122/72 110/65 114/67  ?Pulse: 80  71 71  ?Resp: '17 18 18 20  '$ ?Temp: 98 ?F (36.7 ?C) 97.6 ?F (36.4 ?C) 97.8 ?F (36.6 ?C) 97.9 ?F (36.6 ?C)  ?TempSrc: Oral Oral Oral Oral  ?SpO2: 96% 97% 97% 100%  ?Weight:      ?Height:      ? ?No focal weakness or paresthesias. ? ?LABS:  ? ?Lab Results  ?Component Value Date  ? WBC 9.1 06/01/2021  ? HGB 15.8 06/01/2021  ? HCT 47.6 06/01/2021  ? MCV 91.4 06/01/2021  ? PLT 182 06/01/2021  ? ?Lab Results  ?Component Value Date  ? CREATININE 1.17 06/01/2021  ? ?Lab Results  ?Component Value Date  ? INR 1.0 05/31/2021  ? ? ?PROBLEM LIST:   ? ?Principal Problem: ?  Vision loss of left eye ?Active Problems: ?  Smoker ?  Central retinal artery occlusion of left eye ?  Cerebrovascular disease ?  Stenosis of left carotid artery ?  Cocaine abuse ([place]) ? ? ?CURRENT MEDS:  ? ? aspirin EC  81 mg Oral Daily  ? diltiazem  180 mg Oral Daily  ? DULoxetine  60 mg Oral Daily  ? gabapentin  100 mg Oral BID  ? hydrocerin   Topical Daily  ? nicotine  21 mg Transdermal Daily  ? rosuvastatin  20 mg Oral QHS  ? ? ?Deitra Mayo ?Office: [phone] ?06/03/2021 ? ?

## 2021-06-04 ENCOUNTER — Encounter (HOSPITAL_COMMUNITY)
Admission: EM | Disposition: A | Discharge status: Home / Self Care | Payer: Self-pay | Source: Home / Self Care | Attending: Family Medicine

## 2021-06-04 ENCOUNTER — Inpatient Hospital Stay (HOSPITAL_COMMUNITY): Payer: Medicare Other | Admitting: Certified Registered"

## 2021-06-04 ENCOUNTER — Other Ambulatory Visit: Payer: Self-pay

## 2021-06-04 DIAGNOSIS — I6522 Occlusion and stenosis of left carotid artery: Secondary | ICD-10-CM

## 2021-06-04 DIAGNOSIS — I10 Essential (primary) hypertension: Secondary | ICD-10-CM

## 2021-06-04 HISTORY — PX: ENDARTERECTOMY: SHX5162

## 2021-06-04 HISTORY — PX: PATCH ANGIOPLASTY: SHX6230

## 2021-06-04 LAB — TYPE AND SCREEN
ABO/RH(D): B POS
Antibody Screen: NEGATIVE

## 2021-06-04 LAB — POCT ACTIVATED CLOTTING TIME: Activated Clotting Time: 275 seconds

## 2021-06-04 SURGERY — ENDARTERECTOMY, CAROTID
Anesthesia: General | Site: Neck | Laterality: Left

## 2021-06-04 MED ORDER — SENNOSIDES-DOCUSATE SODIUM 8.6-50 MG PO TABS: 1.0000 | ORAL_TABLET | Freq: Every evening | ORAL | Status: DC | PRN

## 2021-06-04 MED ORDER — ACETAMINOPHEN 10 MG/ML IV SOLN
INTRAVENOUS | Status: AC
  Filled 2021-06-04: qty 100

## 2021-06-04 MED ORDER — CLOPIDOGREL BISULFATE 75 MG PO TABS
75.0000 mg | ORAL_TABLET | Freq: Every day | ORAL | Status: DC
  Administered 2021-06-05: 75 mg via ORAL
  Filled 2021-06-04: qty 1

## 2021-06-04 MED ORDER — SODIUM CHLORIDE 0.9 % IV SOLN: INTRAVENOUS | Status: DC

## 2021-06-04 MED ORDER — LACTATED RINGERS IV SOLN: INTRAVENOUS | Status: DC | PRN

## 2021-06-04 MED ORDER — FENTANYL CITRATE (PF) 250 MCG/5ML IJ SOLN
INTRAMUSCULAR | Status: AC
  Filled 2021-06-04: qty 5

## 2021-06-04 MED ORDER — POTASSIUM CHLORIDE CRYS ER 20 MEQ PO TBCR: 20.0000 meq | EXTENDED_RELEASE_TABLET | Freq: Every day | ORAL | Status: DC | PRN

## 2021-06-04 MED ORDER — ALUM & MAG HYDROXIDE-SIMETH 200-200-20 MG/5ML PO SUSP: 15.0000 mL | ORAL | Status: DC | PRN

## 2021-06-04 MED ORDER — BISACODYL 5 MG PO TBEC: 5.0000 mg | DELAYED_RELEASE_TABLET | Freq: Every day | ORAL | Status: DC | PRN

## 2021-06-04 MED ORDER — PANTOPRAZOLE SODIUM 40 MG PO TBEC
40.0000 mg | DELAYED_RELEASE_TABLET | Freq: Every day | ORAL | Status: DC
  Administered 2021-06-04 – 2021-06-05 (×2): 40 mg via ORAL
  Filled 2021-06-04 (×2): qty 1

## 2021-06-04 MED ORDER — MAGNESIUM SULFATE 2 GM/50ML IV SOLN: 2.0000 g | Freq: Every day | INTRAVENOUS | Status: DC | PRN

## 2021-06-04 MED ORDER — DEXAMETHASONE SODIUM PHOSPHATE 10 MG/ML IJ SOLN
INTRAMUSCULAR | Status: AC
  Filled 2021-06-04: qty 1

## 2021-06-04 MED ORDER — SODIUM CHLORIDE 0.9 % IV SOLN
0.0125 ug/kg/min | INTRAVENOUS | Status: DC
  Administered 2021-06-04: .2 ug/kg/min via INTRAVENOUS
  Filled 2021-06-04 (×2): qty 2000

## 2021-06-04 MED ORDER — ONDANSETRON HCL 4 MG/2ML IJ SOLN: 4.0000 mg | Freq: Four times a day (QID) | INTRAMUSCULAR | Status: DC | PRN

## 2021-06-04 MED ORDER — OXYCODONE HCL 5 MG/5ML PO SOLN: 5.0000 mg | Freq: Once | ORAL | Status: DC | PRN

## 2021-06-04 MED ORDER — ROCURONIUM BROMIDE 10 MG/ML (PF) SYRINGE
PREFILLED_SYRINGE | INTRAVENOUS | Status: AC
  Filled 2021-06-04: qty 10

## 2021-06-04 MED ORDER — 0.9 % SODIUM CHLORIDE (POUR BTL) OPTIME
TOPICAL | Status: DC | PRN
  Administered 2021-06-04: 2000 mL

## 2021-06-04 MED ORDER — FENTANYL CITRATE (PF) 100 MCG/2ML IJ SOLN
25.0000 ug | INTRAMUSCULAR | Status: DC | PRN
  Administered 2021-06-04 (×2): 50 ug via INTRAVENOUS

## 2021-06-04 MED ORDER — HYDRALAZINE HCL 20 MG/ML IJ SOLN: 5.0000 mg | INTRAMUSCULAR | Status: DC | PRN

## 2021-06-04 MED ORDER — PROTAMINE SULFATE 10 MG/ML IV SOLN
INTRAVENOUS | Status: DC | PRN
  Administered 2021-06-04: 30 mg via INTRAVENOUS
  Administered 2021-06-04: 10 mg via INTRAVENOUS

## 2021-06-04 MED ORDER — PROPOFOL 10 MG/ML IV BOLUS
INTRAVENOUS | Status: AC
  Filled 2021-06-04: qty 20

## 2021-06-04 MED ORDER — ACETAMINOPHEN 500 MG PO TABS: 1000.0000 mg | ORAL_TABLET | Freq: Once | ORAL | Status: DC | PRN

## 2021-06-04 MED ORDER — OXYCODONE-ACETAMINOPHEN 5-325 MG PO TABS: 1.0000 | ORAL_TABLET | ORAL | Status: DC | PRN

## 2021-06-04 MED ORDER — ACETAMINOPHEN 325 MG PO TABS
325.0000 mg | ORAL_TABLET | ORAL | Status: DC | PRN
  Administered 2021-06-05: 650 mg via ORAL
  Filled 2021-06-04: qty 2

## 2021-06-04 MED ORDER — LIDOCAINE 2% (20 MG/ML) 5 ML SYRINGE
INTRAMUSCULAR | Status: AC
  Filled 2021-06-04: qty 5

## 2021-06-04 MED ORDER — ACETAMINOPHEN 650 MG RE SUPP: 325.0000 mg | RECTAL | Status: DC | PRN

## 2021-06-04 MED ORDER — MORPHINE SULFATE (PF) 2 MG/ML IV SOLN: 2.0000 mg | INTRAVENOUS | Status: DC | PRN

## 2021-06-04 MED ORDER — ACETAMINOPHEN 10 MG/ML IV SOLN
1000.0000 mg | Freq: Once | INTRAVENOUS | Status: DC | PRN
  Administered 2021-06-04: 1000 mg via INTRAVENOUS

## 2021-06-04 MED ORDER — LIDOCAINE-EPINEPHRINE (PF) 1 %-1:200000 IJ SOLN
INTRAMUSCULAR | Status: AC
  Filled 2021-06-04: qty 30

## 2021-06-04 MED ORDER — LIDOCAINE HCL (PF) 1 % IJ SOLN
INTRAMUSCULAR | Status: AC
  Filled 2021-06-04: qty 5

## 2021-06-04 MED ORDER — DEXAMETHASONE SODIUM PHOSPHATE 10 MG/ML IJ SOLN
INTRAMUSCULAR | Status: DC | PRN
  Administered 2021-06-04: 10 mg via INTRAVENOUS

## 2021-06-04 MED ORDER — ONDANSETRON HCL 4 MG/2ML IJ SOLN
INTRAMUSCULAR | Status: DC | PRN
  Administered 2021-06-04: 4 mg via INTRAVENOUS

## 2021-06-04 MED ORDER — ROCURONIUM BROMIDE 10 MG/ML (PF) SYRINGE
PREFILLED_SYRINGE | INTRAVENOUS | Status: DC | PRN
  Administered 2021-06-04: 100 mg via INTRAVENOUS

## 2021-06-04 MED ORDER — LIDOCAINE-EPINEPHRINE (PF) 1 %-1:200000 IJ SOLN
INTRAMUSCULAR | Status: DC | PRN
  Administered 2021-06-04: 10 mL

## 2021-06-04 MED ORDER — FENTANYL CITRATE (PF) 250 MCG/5ML IJ SOLN
INTRAMUSCULAR | Status: DC | PRN
  Administered 2021-06-04: 100 ug via INTRAVENOUS
  Administered 2021-06-04 (×2): 50 ug via INTRAVENOUS

## 2021-06-04 MED ORDER — OXYCODONE HCL 5 MG PO TABS: 5.0000 mg | ORAL_TABLET | Freq: Once | ORAL | Status: DC | PRN

## 2021-06-04 MED ORDER — SUGAMMADEX SODIUM 200 MG/2ML IV SOLN
INTRAVENOUS | Status: DC | PRN
  Administered 2021-06-04: 200 mg via INTRAVENOUS

## 2021-06-04 MED ORDER — DEXTRAN 40 IN SALINE 10-0.9 % IV SOLN
INTRAVENOUS | Status: AC | PRN
  Administered 2021-06-04: 25 mL/h

## 2021-06-04 MED ORDER — LABETALOL HCL 5 MG/ML IV SOLN: 10.0000 mg | INTRAVENOUS | Status: DC | PRN

## 2021-06-04 MED ORDER — HEPARIN 6000 UNIT IRRIGATION SOLUTION
Status: AC
  Filled 2021-06-04: qty 500

## 2021-06-04 MED ORDER — CEFAZOLIN SODIUM-DEXTROSE 2-4 GM/100ML-% IV SOLN
2.0000 g | Freq: Three times a day (TID) | INTRAVENOUS | Status: AC
  Administered 2021-06-04 – 2021-06-05 (×2): 2 g via INTRAVENOUS
  Filled 2021-06-04 (×2): qty 100

## 2021-06-04 MED ORDER — CEFAZOLIN SODIUM-DEXTROSE 2-3 GM-%(50ML) IV SOLR
INTRAVENOUS | Status: DC | PRN
  Administered 2021-06-04: 2 g via INTRAVENOUS

## 2021-06-04 MED ORDER — HEPARIN SODIUM (PORCINE) 1000 UNIT/ML IJ SOLN
INTRAMUSCULAR | Status: DC | PRN
  Administered 2021-06-04: 10000 [IU] via INTRAVENOUS

## 2021-06-04 MED ORDER — FENTANYL CITRATE (PF) 100 MCG/2ML IJ SOLN
INTRAMUSCULAR | Status: AC
  Filled 2021-06-04: qty 2

## 2021-06-04 MED ORDER — PROPOFOL 10 MG/ML IV BOLUS
INTRAVENOUS | Status: DC | PRN
  Administered 2021-06-04 (×2): 60 mg via INTRAVENOUS
  Administered 2021-06-04: 80 mg via INTRAVENOUS

## 2021-06-04 MED ORDER — SODIUM CHLORIDE 0.9 % IV SOLN: 500.0000 mL | Freq: Once | INTRAVENOUS | Status: DC | PRN

## 2021-06-04 MED ORDER — HEPARIN 6000 UNIT IRRIGATION SOLUTION
Status: DC | PRN
  Administered 2021-06-04: 1

## 2021-06-04 MED ORDER — ACETAMINOPHEN 160 MG/5ML PO SOLN: 1000.0000 mg | Freq: Once | ORAL | Status: DC | PRN

## 2021-06-04 MED ORDER — PHENYLEPHRINE HCL-NACL 20-0.9 MG/250ML-% IV SOLN
INTRAVENOUS | Status: DC | PRN
  Administered 2021-06-04: 150 ug/min via INTRAVENOUS
  Administered 2021-06-04: 75 ug/min via INTRAVENOUS

## 2021-06-04 MED ORDER — LIDOCAINE 2% (20 MG/ML) 5 ML SYRINGE
INTRAMUSCULAR | Status: DC | PRN
  Administered 2021-06-04: 60 mg via INTRAVENOUS

## 2021-06-04 MED ORDER — EPHEDRINE SULFATE-NACL 50-0.9 MG/10ML-% IV SOSY
PREFILLED_SYRINGE | INTRAVENOUS | Status: DC | PRN
  Administered 2021-06-04: 10 mg via INTRAVENOUS
  Administered 2021-06-04 (×2): 2.5 mg via INTRAVENOUS
  Administered 2021-06-04: 15 mg via INTRAVENOUS

## 2021-06-04 MED ORDER — METOPROLOL TARTRATE 5 MG/5ML IV SOLN: 2.0000 mg | INTRAVENOUS | Status: DC | PRN

## 2021-06-04 MED ORDER — ASPIRIN EC 81 MG PO TBEC
81.0000 mg | DELAYED_RELEASE_TABLET | Freq: Every day | ORAL | Status: DC
  Administered 2021-06-05: 81 mg via ORAL
  Filled 2021-06-04: qty 1

## 2021-06-04 SURGICAL SUPPLY — 50 items
ADH SKN CLS APL DERMABOND .7 (GAUZE/BANDAGES/DRESSINGS) ×1
BAG COUNTER SPONGE SURGICOUNT (BAG) ×2 IMPLANT
BAG DECANTER FOR FLEXI CONT (MISCELLANEOUS) ×3 IMPLANT
BAG SPNG CNTER NS LX DISP (BAG) ×1
BAG SURGICOUNT SPONGE COUNTING (BAG) ×1
CANISTER SUCT 3000ML PPV (MISCELLANEOUS) ×3 IMPLANT
CANNULA VESSEL 3MM 2 BLNT TIP (CANNULA) ×9 IMPLANT
CATH ROBINSON RED A/P 18FR (CATHETERS) ×3 IMPLANT
CLIP VESOCCLUDE MED 24/CT (CLIP) ×3 IMPLANT
CLIP VESOCCLUDE SM WIDE 24/CT (CLIP) ×3 IMPLANT
DERMABOND ADVANCED (GAUZE/BANDAGES/DRESSINGS) ×2
DERMABOND ADVANCED .7 DNX12 (GAUZE/BANDAGES/DRESSINGS) ×1 IMPLANT
DRAIN CHANNEL 15F RND FF W/TCR (WOUND CARE) IMPLANT
ELECT REM PT RETURN 9FT ADLT (ELECTROSURGICAL) ×3
ELECTRODE REM PT RTRN 9FT ADLT (ELECTROSURGICAL) ×1 IMPLANT
EVACUATOR SILICONE 100CC (DRAIN) IMPLANT
GAUZE 4X4 16PLY ~~LOC~~+RFID DBL (SPONGE) ×2 IMPLANT
GLOVE SRG 8 PF TXTR STRL LF DI (GLOVE) ×1 IMPLANT
GLOVE SURG ENC MOIS LTX SZ7.5 (GLOVE) ×3 IMPLANT
GLOVE SURG POLY ORTHO LF SZ7.5 (GLOVE) IMPLANT
GLOVE SURG UNDER LTX SZ8 (GLOVE) ×3 IMPLANT
GLOVE SURG UNDER POLY LF SZ8 (GLOVE) ×3
GOWN STRL REUS W/ TWL LRG LVL3 (GOWN DISPOSABLE) ×3 IMPLANT
GOWN STRL REUS W/TWL LRG LVL3 (GOWN DISPOSABLE) ×18
GRAFT VASC PATCH XENOSURE 1X14 (Vascular Products) ×2 IMPLANT
KIT BASIN OR (CUSTOM PROCEDURE TRAY) ×3 IMPLANT
KIT SHUNT ARGYLE CAROTID ART 6 (VASCULAR PRODUCTS) ×2 IMPLANT
KIT TURNOVER KIT B (KITS) ×3 IMPLANT
NDL HYPO 25GX1X1/2 BEV (NEEDLE) ×1 IMPLANT
NEEDLE HYPO 25GX1X1/2 BEV (NEEDLE) ×3 IMPLANT
NS IRRIG 1000ML POUR BTL (IV SOLUTION) ×6 IMPLANT
PACK CAROTID (CUSTOM PROCEDURE TRAY) ×3 IMPLANT
PAD ARMBOARD 7.5X6 YLW CONV (MISCELLANEOUS) ×6 IMPLANT
POSITIONER HEAD DONUT 9IN (MISCELLANEOUS) ×1 IMPLANT
SET WALTER ACTIVATION W/DRAPE (SET/KITS/TRAYS/PACK) IMPLANT
SHUNT CAROTID BYPASS 10 (VASCULAR PRODUCTS) IMPLANT
SHUNT CAROTID BYPASS 12 (VASCULAR PRODUCTS) IMPLANT
SPONGE SURGIFOAM ABS GEL 100 (HEMOSTASIS) IMPLANT
SPONGE T-LAP 18X18 ~~LOC~~+RFID (SPONGE) ×2 IMPLANT
SUT MNCRL AB 4-0 PS2 18 (SUTURE) ×3 IMPLANT
SUT PROLENE 6 0 BV (SUTURE) ×6 IMPLANT
SUT SILK 2 0 (SUTURE) ×3
SUT SILK 2 0 PERMA HAND 18 BK (SUTURE) IMPLANT
SUT SILK 2-0 18XBRD TIE 12 (SUTURE) IMPLANT
SUT VIC AB 3-0 SH 27 (SUTURE) ×3
SUT VIC AB 3-0 SH 27X BRD (SUTURE) ×1 IMPLANT
SYR 20ML LL LF (SYRINGE) ×3 IMPLANT
SYR CONTROL 10ML LL (SYRINGE) ×3 IMPLANT
TOWEL GREEN STERILE (TOWEL DISPOSABLE) ×3 IMPLANT
WATER STERILE IRR 1000ML POUR (IV SOLUTION) ×3 IMPLANT

## 2021-06-04 NOTE — Progress Notes (Signed)
I was unable to evaluate this patient today as he is yet to be back from surgery since 7 AM.  ? ?A/P: ?CRAO, symptomatic left carotid stenosis/high-risk plaque disease ?Assess for stability post-op and vascular's recs regarding antiplatelet management. ? ?Questionable left 3rd toe osteomyelitis ?Vascular consulted for this, and they recommended no further inpatient evaluation and treatment. ?Consider ortho/foot consult if this worsens. ? ?Questionable meningioma on CTA ?MRI brain once stable s/p surgery. ?Can obtain prior to discharge from the hospital. ?Based on the previous day's evaluation - no neurologic deficit. ?

## 2021-06-04 NOTE — Progress Notes (Signed)
OT Cancellation Note ? ?Patient Details ?Name: Michael Chambers ?MRN: [id number] ?DOB: 06/22/1960 ? ? ?Cancelled Treatment:    Reason Eval/Treat Not Completed: Patient at procedure or test/ unavailable.  Will reattempt. ? ?Caasi Giglia C., OTR/L ?Acute Rehabilitation Services ?Pager [phone] ?Office [phone] ? ? ?Aeisha Minarik M ?06/04/2021, 1:46 PM ?

## 2021-06-04 NOTE — Progress Notes (Signed)
? ?  VASCULAR SURGERY POSTOP:  ? ?Doing well post op. ?Awaiting bed on 4E. ? ?SUBJECTIVE:  ? ?No complaints. ? ?PHYSICAL EXAM:  ? ?Vitals:  ? 06/04/21 1105 06/04/21 1135 06/04/21 1150 06/04/21 1205  ?BP: 97/67 93/63 103/68 94/69  ?Pulse: 73 72 67 65  ?Resp: '11 12 13 12  '$ ?Temp:      ?TempSrc:      ?SpO2: 96% 94% 97% 97%  ?Weight:      ?Height:      ? ?Neuro intact. ?Incision looks fine ? ?LABS:  ? ?Lab Results  ?Component Value Date  ? WBC 9.1 06/01/2021  ? HGB 15.8 06/01/2021  ? HCT 47.6 06/01/2021  ? MCV 91.4 06/01/2021  ? PLT 182 06/01/2021  ? ?Lab Results  ?Component Value Date  ? CREATININE 1.17 06/01/2021  ? ?Lab Results  ?Component Value Date  ? INR 1.0 05/31/2021  ? ? ?PROBLEM LIST:   ? ?Principal Problem: ?  Vision loss of left eye ?Active Problems: ?  Smoker ?  Central retinal artery occlusion of left eye ?  Cerebrovascular disease ?  Stenosis of left carotid artery ?  Cocaine abuse ([place]) ? ? ?CURRENT MEDS:  ? ? [MAR Hold] aspirin EC  81 mg Oral Daily  ? [MAR Hold] diltiazem  180 mg Oral Daily  ? [MAR Hold] DULoxetine  60 mg Oral Daily  ? fentaNYL      ? [MAR Hold] gabapentin  100 mg Oral BID  ? [MAR Hold] hydrocerin   Topical Daily  ? [MAR Hold] nicotine  21 mg Transdermal Daily  ? [MAR Hold] rosuvastatin  20 mg Oral QHS  ? ? ?Deitra Mayo ?Office: [phone] ?06/04/2021 ? ?

## 2021-06-04 NOTE — Transfer of Care (Signed)
Immediate Anesthesia Transfer of Care Note ? ?Patient: Michael Chambers ? ?Procedure(s) Performed: LEFT CAROTID ENDARTERECTOMY (Left: Neck) ?PATCH ANGIOPLASTY USING XENOSURE (Left: Neck) ? ?Patient Location: PACU ? ?Anesthesia Type:General ? ?Level of Consciousness: awake, alert , oriented and patient cooperative ? ?Airway & Oxygen Therapy: Patient Spontanous Breathing ? ?Post-op Assessment: Report given to RN, Post -op Vital signs reviewed and stable and Patient moving all extremities ? ?Post vital signs: Reviewed and stable ? ?Last Vitals:  ?Vitals Value Taken Time  ?BP 125/70 06/04/21 1005  ?Temp 36.9 ?C 06/04/21 1005  ?Pulse 90 06/04/21 1005  ?Resp 15 06/04/21 1008  ?SpO2 91 % 06/04/21 1005  ?Vitals shown include unvalidated device data. ? ?Last Pain:  ?Vitals:  ? 06/04/21 1005  ?TempSrc:   ?PainSc: 7   ?   ? ?  ? ?Complications: No notable events documented. ?

## 2021-06-04 NOTE — Interval H&P Note (Signed)
History and Physical Interval Note: ? ?06/04/2021 ?7:13 AM ? ?Michael Chambers  has presented today for surgery, with the diagnosis of Symptomatic left carotid stenosis.  The various methods of treatment have been discussed with the patient and family. After consideration of risks, benefits and other options for treatment, the patient has consented to  Procedure(s): ?LEFT CAROTID ENDARTERECTOMY (Left) as a surgical intervention.  The patient's history has been reviewed, patient examined, no change in status, stable for surgery.  I have reviewed the patient's chart and labs.  Questions were answered to the patient's satisfaction.   ? ? ?Deitra Mayo ? ? ?

## 2021-06-04 NOTE — Anesthesia Procedure Notes (Signed)
Procedure Name: Intubation ?Date/Time: 06/04/2021 7:47 AM ?Performed by: Myna Bright, CRNA ?Pre-anesthesia Checklist: Patient identified, Emergency Drugs available, Suction available and Patient being monitored ?Patient Re-evaluated:Patient Re-evaluated prior to induction ?Oxygen Delivery Method: Circle system utilized ?Preoxygenation: Pre-oxygenation with 100% oxygen ?Induction Type: IV induction ?Ventilation: Mask ventilation without difficulty and Oral airway inserted - appropriate to patient size ?Laryngoscope Size: Mac and 4 ?Grade View: Grade II ?Tube type: Oral ?Tube size: 7.5 mm ?Number of attempts: 1 ?Airway Equipment and Method: Stylet ?Placement Confirmation: ETT inserted through vocal cords under direct vision, positive ETCO2 and breath sounds checked- equal and bilateral ?Secured at: 22 cm ?Tube secured with: Tape ?Dental Injury: Teeth and Oropharynx as per pre-operative assessment  ? ? ? ? ?

## 2021-06-04 NOTE — Op Note (Signed)
? ? ?NAME: Michael Chambers    MRN: [id number] ?DOB: 22-Aug-1960    DATE OF OPERATION: 06/04/2021 ? ?PREOP DIAGNOSIS:   ? ?Symptomatic left carotid stenosis ? ?POSTOP DIAGNOSIS:   ? ?Same ? ?PROCEDURE:  ?  ?Left carotid endarterectomy with bovine pericardial patch angioplasty ? ?SURGEON: Judeth Cornfield. Scot Dock, MD ? ?ASSIST: Laurence Slate, PA ? ?ANESTHESIA: General ? ?EBL: Minimal ? ?INDICATIONS:  ? ? Michael Chambers is a 61 y.o. male who presented with a left retinal infarct.  He was found to have a 60% left carotid stenosis.  Left carotid endarterectomy was recommended in order to lower his risk of future stroke. ? ?FINDINGS:  ? ?He had a 60% left carotid stenosis with extensive calcific plaque extending proximally in the common carotid artery ? ?TECHNIQUE:  ? ?The patient was taken to the operating room and received a general anesthetic.  The left neck was prepped and draped in the usual sterile fashion.  An incision was made along the anterior border of the sternocleidomastoid and the dissection carried down to the common carotid artery which was dissected free and controlled with a Rummel tourniquet.  The patient was heparinized.  ACT was monitored throughout the procedure.  Facial vein was divided between 2-0 silk ties.  This allowed exposure of the external carotid artery and internal carotid artery above the plaque.  The superior thyroid artery was also controlled. ? ?Clamps were then placed on the internal and the common and the external carotid artery.  A longitudinal arteriotomy was made in the common carotid artery and this was extended through the plaque proximally.  I do extend the arteriotomy fairly low on the common carotid artery given the plaque along the posterior medial wall.  A 10 shunt was placed into the internal carotid artery backbled and then placed into the common carotid artery and secured with Rummel tourniquet.  Flow was reestablished to the shunt.  Flow was checked with the Doppler. ? ?An  endarterectomy plane was established proximally and the plaque was sharply divided.  Eversion endarterectomy was performed of the external carotid artery.  Distally was a nice tapering the plaque and no tacking sutures were required.  The artery was irrigated with copious amounts of heparin and dextran and all loose debris removed.  I then sewed the bovine pericardial patch with continuous 6-0 Prolene suture.  Prior to completing the patch closure the shunt was removed.  The arteries were backbled and flushed appropriately and the anastomosis completed.  Flow was reestablished first to the external carotid artery and into the internal carotid artery.  There was a good signal distal to the patch and a good pulse.  The heparin was partially reversed with protamine. ? ?Hemostasis was obtained in the wound.  The wound was closed with a deep layer of 3-0 Vicryl.  The platysma was closed with running 3-0 Vicryl.  The skin was closed with 4-0 Monocryl.  Dermabond was applied.  The patient tolerated the procedure well and awoke neurologically intact.  All needle and sponge counts were correct.  He was transferred to the recovery room in stable condition. ? ? ?Given the complexity of the case,  the assistant was necessary in order to expedient the procedure and safely perform the technical aspects of the operation.  The assistant provided traction and countertraction to assist with exposure of the common carotid artery, external carotid artery, and internal carotid artery.  They also assisted with suture ligatures and dividing the facial vein and  multiple small venous branches tethering the hypoglossal nerve. The assistant also played a critical role in placing the shunt safely.  In addition they were necessary to provide adequate traction and countertraction to perform a precise endarterectomy and precise closure.  These skills, especially following the Prolene suture for the anastomosis, could not have been adequately  performed by a scrub tech assistant.  ? ? ?Deitra Mayo, MD, FACS ?[facility] ? ?DATE OF DICTATION:   06/04/2021 ? ?

## 2021-06-04 NOTE — Progress Notes (Signed)
PHARMACIST LIPID MONITORING ? ? ?Michael Chambers is a 61 y.o. male admitted on 05/31/2021 with Vision loss of left eye.  Pharmacy has been consulted to optimize lipid-lowering therapy with the indication of secondary prevention for clinical ASCVD. ? ?Recent Labs: ? ?Lipid Panel (last 6 months):   ?Lab Results  ?Component Value Date  ? CHOL 157 06/01/2021  ? TRIG 214 (H) 06/01/2021  ? HDL 32 (L) 06/01/2021  ? CHOLHDL 4.9 06/01/2021  ? VLDL 43 (H) 06/01/2021  ? [place] 82 06/01/2021  ? ? ?Hepatic function panel (last 6 months):   ?Lab Results  ?Component Value Date  ? AST 24 05/31/2021  ? ALT 23 05/31/2021  ? ALKPHOS 90 05/31/2021  ? BILITOT 0.4 05/31/2021  ? ? ?SCr (since admission):   ?Serum creatinine: 1.17 mg/dL 06/01/21 0052 ?Estimated creatinine clearance: 77.4 mL/min ? ?Current therapy and lipid therapy tolerance ?Current lipid-lowering therapy: Crestor '10mg'$  qday ?Previous lipid-lowering therapies (if applicable):  ?Documented or reported allergies or intolerances to lipid-lowering therapies (if applicable): None ? ?Assessment:   ?Patient agrees with changes to lipid-lowering therapy ? ?Plan:   ? ?1.Statin intensity (high intensity recommended for all patients regardless of the LDL):  No statin changes. The patient is already on a high intensity statin. PTA Crestor '10mg'$  increased to '20mg'$  qday. ? ?2.Add ezetimibe (if any one of the following):   Not indicated at this time. ? ?3.Refer to lipid clinic:   No ? ?4.Follow-up with:  Primary care provider - Monico Blitz, MD ? ?5.Follow-up labs after discharge:  Changes in lipid therapy were made. Check a lipid panel in 8-12 weeks then annually.    ? ? ? ?Onnie Boer, PharmD, BCIDP, AAHIVP, CPP ?Infectious Disease Pharmacist ?06/04/2021 4:50 PM ? ? ? ?

## 2021-06-04 NOTE — Progress Notes (Signed)
FPTS Interim Night Progress Note ? ?S:Patient sleeping comfortably.  Rounded with primary night RN. Scheduled for Endarterectomy at 7 am.  No concerns voiced.  No orders required.   ? ?O: ?Today's Vitals  ? 06/03/21 1609 06/03/21 2019 06/03/21 2101 06/04/21 0008  ?BP: 131/71 119/81  138/86  ?Pulse: 74 72  66  ?Resp: '18 20  18  '$ ?Temp: 97.8 ?F (36.6 ?C) 98.1 ?F (36.7 ?C)  97.9 ?F (36.6 ?C)  ?TempSrc: Oral Oral  Oral  ?SpO2: 96% 94%  97%  ?Weight:      ?Height:      ?PainSc:   8    ? ? ? ? ?A/P: ?Continue current management ? ?Carollee Leitz MD ?PGY-3, [facility] ?Service pager [phone]   ?
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
## 2021-06-04 NOTE — Anesthesia Postprocedure Evaluation (Signed)
Anesthesia Post Note ? ?Patient: Michael Chambers ? ?Procedure(s) Performed: LEFT CAROTID ENDARTERECTOMY (Left: Neck) ?PATCH ANGIOPLASTY USING XENOSURE (Left: Neck) ? ?  ? ?Patient location during evaluation: PACU ?Anesthesia Type: General ?Level of consciousness: awake and alert ?Pain management: pain level controlled ?Vital Signs Assessment: post-procedure vital signs reviewed and stable ?Respiratory status: spontaneous breathing, nonlabored ventilation, respiratory function stable and patient connected to nasal cannula oxygen ?Cardiovascular status: blood pressure returned to baseline and stable ?Postop Assessment: no apparent nausea or vomiting ?Anesthetic complications: no ? ? ?No notable events documented. ? ?Last Vitals:  ?Vitals:  ? 06/04/21 1235 06/04/21 1305  ?BP: 98/70 109/77  ?Pulse: 68 72  ?Resp: 12 12  ?Temp:    ?SpO2: 99% 96%  ?  ?Last Pain:  ?Vitals:  ? 06/04/21 1205  ?TempSrc:   ?PainSc: Asleep  ? ? ?  ?  ?  ?  ?  ?  ? ?Marvis Bakken ? ? ? ? ?

## 2021-06-05 LAB — BASIC METABOLIC PANEL
Anion gap: 11 (ref 5–15)
BUN: 22 mg/dL (ref 8–23)
CO2: 21 mmol/L — ABNORMAL LOW (ref 22–32)
Calcium: 8.6 mg/dL — ABNORMAL LOW (ref 8.9–10.3)
Chloride: 103 mmol/L (ref 98–111)
Creatinine, Ser: 1.01 mg/dL (ref 0.61–1.24)
GFR, Estimated: >60 mL/min (ref >60)
Glucose, Bld: 179 mg/dL — ABNORMAL HIGH (ref 70–99)
Potassium: 4.6 mmol/L (ref 3.5–5.1)
Sodium: 135 mmol/L (ref 135–145)

## 2021-06-05 LAB — CBC
HCT: 46.9 % (ref 39.0–52.0)
Hemoglobin: 15.5 g/dL (ref 13.0–17.0)
MCH: 29.9 pg (ref 26.0–34.0)
MCHC: 33 g/dL (ref 30.0–36.0)
MCV: 90.5 fL (ref 80.0–100.0)
Platelets: 190 10*3/uL (ref 150–400)
RBC: 5.18 MIL/uL (ref 4.22–5.81)
RDW: 13.1 % (ref 11.5–15.5)
WBC: 13.6 10*3/uL — ABNORMAL HIGH (ref 4.0–10.5)
nRBC: 0 % (ref 0.0–0.2)

## 2021-06-05 LAB — LIPID PANEL
Cholesterol: 115 mg/dL (ref 0–200)
HDL: 41 mg/dL (ref >40)
LDL Cholesterol: 53 mg/dL (ref 0–99)
Total CHOL/HDL Ratio: 2.8 RATIO
Triglycerides: 107 mg/dL (ref <150)
VLDL: 21 mg/dL (ref 0–40)

## 2021-06-05 MED ORDER — ROSUVASTATIN CALCIUM 20 MG PO TABS: 20.0000 mg | ORAL_TABLET | Freq: Every day | ORAL | 6 refills | Status: DC

## 2021-06-05 MED ORDER — ASPIRIN 81 MG PO TBEC: 81.0000 mg | DELAYED_RELEASE_TABLET | Freq: Every day | ORAL | 11 refills | Status: DC

## 2021-06-05 MED ORDER — HYDROCERIN EX CREA: 1.0000 "application " | TOPICAL_CREAM | Freq: Every day | CUTANEOUS | 0 refills | Status: DC

## 2021-06-05 MED ORDER — ROSUVASTATIN CALCIUM 20 MG PO TABS: 20.0000 mg | ORAL_TABLET | Freq: Every day | ORAL | 0 refills | Status: DC

## 2021-06-05 MED ORDER — CLOPIDOGREL BISULFATE 75 MG PO TABS: 75.0000 mg | ORAL_TABLET | Freq: Every day | ORAL | 0 refills | Status: DC

## 2021-06-05 NOTE — Progress Notes (Signed)
Pt ambulated entire unit without any assistance. Pt tolerated activity well and is resting in bed.  ?
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
## 2021-06-05 NOTE — Discharge Instructions (Addendum)
? ?[facility] ? ?Discharge Instructions ?  ?Carotid Surgery ? ?Please refer to the following instructions for your post-procedure care. Your surgeon or physician assistant will discuss any changes with you. ? ?Activity ? ?You are encouraged to walk as much as you can. You can slowly return to normal activities but must avoid strenuous activity and heavy lifting until your doctor tell you it's okay. Avoid activities such as vacuuming or swinging a golf club. You can drive after one week if you are comfortable and you are no longer taking prescription pain medications. It is normal to feel tired for serval weeks after your surgery. It is also normal to have difficulty with sleep habits, eating, and bowel movements after surgery. These will go away with time. ? ?Bathing/Showering ? ?Shower daily after you go home. Do not soak in a bathtub, hot tub, or swim until the incision heals completely. ? ?Incision Care ? ?Shower every day. Clean your incision with mild soap and water. Pat the area dry with a clean towel. You do not need a bandage unless otherwise instructed. Do not apply any ointments or creams to your incision. You may have skin glue on your incision. Do not peel it off. It will come off on its own in about one week. Your incision may feel thickened and raised for several weeks after your surgery. This is normal and the skin will soften over time.  ? ?For Men Only: It's okay to shave around the incision but do not shave the incision itself for 2 weeks. It is common to have numbness under your chin that could last for several months. ? ?Diet ? ?Resume your normal diet. There are no special food restrictions following this procedure. A low fat/low cholesterol diet is recommended for all patients with vascular disease. In order to heal from your surgery, it is CRITICAL to get adequate nutrition. Your body requires vitamins, minerals, and protein. Vegetables are the best source of  vitamins and minerals. Vegetables also provide the perfect balance of protein. Processed food has little nutritional value, so try to avoid this. ? ?Medications ? ?Resume taking all of your medications unless your doctor or physician assistant tells you not to. If your incision is causing pain, you may take over-the- counter pain relievers such as acetaminophen (Tylenol). If you were prescribed a stronger pain medication, please be aware these medications can cause nausea and constipation. Prevent nausea by taking the medication with a snack or meal. Avoid constipation by drinking plenty of fluids and eating foods with a high amount of fiber, such as fruits, vegetables, and grains.  ?Do not take Tylenol if you are taking prescription pain medications. ? ?Follow Up ? ?Our office will schedule a follow up appointment 2-3 weeks following discharge. ? ?Please call us immediately for any of the following conditions ? ?Increased pain, redness, drainage (pus) from your incision site. ?Fever of 101 degrees or higher. ?If you should develop stroke (slurred speech, difficulty swallowing, weakness on one side of your body, loss of vision) you should call 911 and go to the nearest emergency room. ? ?Reduce your risk of vascular disease: ? ?Stop smoking. If you would like help call [facility] at 1-800-QUIT-NOW (313) 315-2591) or [place] at [phone]. ?Manage your cholesterol ?Maintain a desired weight ?Control your diabetes ?Keep your blood pressure down ? ?If you have any questions, please call the office at [phone]. ? ?Thank you for allowing [place] to participate in your care!   ? ?  You were admitted for vision loss in your left eye and you were found to have a blockage in the blood supply to that eye.  You were seen by the vascular surgery team who operated on your neck.  Above is their discharge instructions.  Occupational Therapy saw you and recommended outpatient Occupational Therapy.  You will need to see your  primary care doctor for the referral for this.  Regarding medication changes we started you on aspirin and Plavix daily which was neurology's recommendations as well as increased your Crestor to 20 mg daily. ? ?If you experience worsening of your admission symptoms, develop shortness of breath, life threatening emergency, suicidal or homicidal thoughts you must seek medical attention immediately by calling 911 or calling your MD immediately  if symptoms less severe. ? ?
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
## 2021-06-05 NOTE — Progress Notes (Addendum)
He denies any concerns today. No acute findings on his physical exam. ?Neuro exam grossly intact with no sensory loss. ? 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 He had MRA head and neck and a non-contrast MRI brain done during this hospitalization without mentioning this mass. ?I discussed this with the neurology PA [facility], as well as with Dr. Rosalin Hawking; since he is currently asymptomatic, they recommended outpatient evaluation by his PCP/Neurology. ?I discuss the follow-up plan with the patient. He agreed to discuss this with his PCP. ? ?Left foot ulcer: ?Improved - initial concern for osteomyelitis. Hence, vascular surg was consulted to evaluate this and this not recommend further work up. ?His foot looks better today. ?Continue to monitor with PCP and vascular. ? ?May d/c home if vascular clears him. ? ? ? ? ? ?

## 2021-06-05 NOTE — TOC Transition Note (Signed)
Transition of Care (TOC) - CM/SW Discharge Note ? ? ?Patient Details  ?Name: Michael Chambers ?MRN: [id number] ?Date of Birth: Mar 11, 1961 ? ?Transition of Care (TOC) CM/SW Contact:  ?Macaiah Mangal G., RN ?Phone Number: ?06/05/2021, 1:45 PM ? ? ?Clinical Narrative:    ? ?RNCM received order for DME.  Jasmine at [facility] contacted with order and confirmation received.  DME to be delivered to patient's room prior to discharge-patient aware. ? ? ? ?Final next level of care: Home/Self Care ?Barriers to Discharge: Continued Medical Work up ? ? ?Patient Goals and CMS Choice ?Patient states their goals for this hospitalization and ongoing recovery are:: to return to home ?CMS Medicare.gov Compare Post Acute Care list provided to:: Patient ?Choice offered to / list presented to : Patient ? ?Discharge Placement ?  ?           ?  ?  ?  ?  ? ?Discharge Plan and Services ?  ?Discharge Planning Services: CM Consult ?Post Acute Care Choice:  (OP OT for low vision)          ?DME Arranged: Kasandra Knudsen ?DME Agency: [employer] ?Date DME Agency Contacted: 06/05/21 ?Time DME Agency Contacted: [phone] ?Representative spoke with at DME Agency: Delana Meyer ?HH Arranged: NA ?  ?  ?  ?  ? ?Social Determinants of Health (SDOH) Interventions ?  ? ? ?Readmission Risk Interventions ?No flowsheet data found. ? ? ? ? ?

## 2021-06-05 NOTE — Plan of Care (Signed)
  Problem: Activity: Goal: Ability to tolerate increased activity will improve Outcome: Progressing   Problem: Cardiac: Goal: Ability to achieve and maintain adequate cardiopulmonary perfusion will improve Outcome: Progressing   Problem: Health Behavior/Discharge Planning: Goal: Ability to safely manage health-related needs after discharge will improve Outcome: Progressing   

## 2021-06-05 NOTE — Plan of Care (Signed)
  Problem: Education: Goal: Knowledge of disease or condition will improve Outcome: Progressing Goal: Understanding of medication regimen will improve Outcome: Progressing Goal: Individualized Educational Video(s) Outcome: Progressing   

## 2021-06-05 NOTE — Progress Notes (Signed)
FPTS Interim Night Progress Note ? ?S:Patient sleeping comfortably.  Rounded with primary night RN.  No concerns voiced. Reports patient could see slightly from left eye.  No orders required.   ? ?O: ?Today's Vitals  ? 06/04/21 2342 06/05/21 0142 06/05/21 0226 06/05/21 0424  ?BP: 115/77 114/70 114/70 101/65  ?Pulse: 73 69 69 60  ?Resp: '13 14 14 12  '$ ?Temp: 98.1 ?F (36.7 ?C) 98.1 ?F (36.7 ?C) 98.1 ?F (36.7 ?C) 98 ?F (36.7 ?C)  ?TempSrc: Oral  Oral Oral  ?SpO2: 96% 95% 95% 96%  ?Weight:      ?Height:      ?PainSc:      ? ? ? ? ?A/P: ?Continue current management ? ?Carollee Leitz MD ?PGY-3, [facility] ?Service pager [phone]   ?

## 2021-06-05 NOTE — Progress Notes (Addendum)
?  Progress Note ? ? ? ?06/05/2021 ?7:02 AM ?1 Day Post-Op ? ?Subjective:  says his neck is a little sore but his back hurts worse than his neck.  Denies any trouble swallowing.  Has voided.  ? ?Afebrile ?HR 70's-90's  ?094'M-768'G systolic ?88% RA ? ?Gtts: none ? ?Vitals:  ? 06/05/21 0342 06/05/21 0424  ?BP: 101/65 101/65  ?Pulse: 60 60  ?Resp: 12 12  ?Temp: 98 ?F (36.7 ?C) 98 ?F (36.7 ?C)  ?SpO2: 96% 96%  ?  ? ?Physical Exam: ?Neuro:  in tact; tongue is midline ?Lungs:  non labored ?Incision:  clean without hematoma ? ?CBC ?   ?Component Value Date/Time  ? WBC 13.6 (H) 06/05/2021 0117  ? RBC 5.18 06/05/2021 0117  ? HGB 15.5 06/05/2021 0117  ? HCT 46.9 06/05/2021 0117  ? PLT 190 06/05/2021 0117  ? MCV 90.5 06/05/2021 0117  ? MCH 29.9 06/05/2021 0117  ? MCHC 33.0 06/05/2021 0117  ? RDW 13.1 06/05/2021 0117  ? LYMPHSABS 3.8 06/01/2021 0052  ? MONOABS 0.8 06/01/2021 0052  ? EOSABS 0.2 06/01/2021 0052  ? BASOSABS 0.1 06/01/2021 0052  ? ? ?BMET ?   ?Component Value Date/Time  ? NA 135 06/05/2021 0117  ? K 4.6 06/05/2021 0117  ? CL 103 06/05/2021 0117  ? CO2 21 (L) 06/05/2021 0117  ? GLUCOSE 179 (H) 06/05/2021 0117  ? BUN 22 06/05/2021 0117  ? CREATININE 1.01 06/05/2021 0117  ? CALCIUM 8.6 (L) 06/05/2021 0117  ? GFRNONAA >60 06/05/2021 0117  ? GFRAA >60 03/16/2016 0859  ? ? ? ?Intake/Output Summary (Last 24 hours) at 06/05/2021 0702 ?Last data filed at 06/05/2021 0400 ?Gross per 24 hour  ?Intake 2840 ml  ?Output 2750 ml  ?Net 90 ml  ? ? ? Assessment/Plan:  This is a 61 y.o. male who is s/p left CEA 1 Day Post-Op ? ? ?-pt is doing well this am. ?-pt neuro exam is in tact ?-pt has not ambulated-needs to walk ?-pt has voided ?-f/u with [facility] in 2-3 weeks. Pt duplex on 06/01/2021 revealed 1-39% right ICA stenosis ?-ok for discharge from vascular standpoint.  Will have him see Dr. Scot Dock in the office to determine timing of angiogram. Our office will arrange this appt. ? ? ?Leontine Locket, PA-C ?[facility] ?[phone] ? ? ?I have interviewed and examined patient with PA and agree with assessment and plan above.  ? ?Khyron Garno C. Donzetta Matters, MD ?[facility] ?Office: [phone] ?Pager: [phone] ? ?Pager: [phone] ? ? ?

## 2021-06-07 ENCOUNTER — Encounter (HOSPITAL_COMMUNITY): Payer: Self-pay | Admitting: Vascular Surgery

## 2021-06-16 ENCOUNTER — Telehealth: Payer: Self-pay

## 2021-06-16 NOTE — Telephone Encounter (Signed)
Spoke with pt to schedule diabetic shoe pick up. He stated that he had had a stroke and has follow up appointments with other doctors right now. I advised the pt that his Josem Kaufmann would expire in May but I was willing to resend it to get a new signature if needed. He was appreciative and stated that once he was done with some his appointments that he would call back to schedule an appointment with [facility].

## 2021-06-22 DIAGNOSIS — Z299 Encounter for prophylactic measures, unspecified: Secondary | ICD-10-CM | POA: Diagnosis not present

## 2021-06-22 DIAGNOSIS — F17210 Nicotine dependence, cigarettes, uncomplicated: Secondary | ICD-10-CM | POA: Diagnosis not present

## 2021-06-22 DIAGNOSIS — H3412 Central retinal artery occlusion, left eye: Secondary | ICD-10-CM | POA: Diagnosis not present

## 2021-06-22 DIAGNOSIS — I10 Essential (primary) hypertension: Secondary | ICD-10-CM | POA: Diagnosis not present

## 2021-06-22 DIAGNOSIS — H34822 Venous engorgement, left eye: Secondary | ICD-10-CM | POA: Diagnosis not present

## 2021-06-22 DIAGNOSIS — I63239 Cerebral infarction due to unspecified occlusion or stenosis of unspecified carotid arteries: Secondary | ICD-10-CM | POA: Diagnosis not present

## 2021-06-22 DIAGNOSIS — M549 Dorsalgia, unspecified: Secondary | ICD-10-CM | POA: Diagnosis not present

## 2021-06-29 ENCOUNTER — Ambulatory Visit (INDEPENDENT_AMBULATORY_CARE_PROVIDER_SITE_OTHER): Payer: Medicare Other

## 2021-06-29 DIAGNOSIS — M2042 Other hammer toe(s) (acquired), left foot: Secondary | ICD-10-CM | POA: Diagnosis not present

## 2021-06-29 DIAGNOSIS — E1142 Type 2 diabetes mellitus with diabetic polyneuropathy: Secondary | ICD-10-CM | POA: Diagnosis not present

## 2021-06-29 DIAGNOSIS — M2041 Other hammer toe(s) (acquired), right foot: Secondary | ICD-10-CM | POA: Diagnosis not present

## 2021-06-29 DIAGNOSIS — Z89422 Acquired absence of other left toe(s): Secondary | ICD-10-CM

## 2021-06-29 NOTE — Progress Notes (Signed)
SITUATION ?Reason for Visit: Fitting of Diabetic [place] ?Patient / Caregiver Report:  Patient is satisfied with fit and function of shoes and insoles. ? ?OBJECTIVE DATA: ?Patient History / Diagnosis:   ?  ICD-10-CM   ?1. Diabetic polyneuropathy associated with type 2 diabetes mellitus ([place])  E11.42   ?  ?2. Hammertoe of right foot  M20.41   ?  ?3. Hammertoe of left foot  M20.42   ?  ?4. History of amputation of lesser toe of left foot ([place])  F09.323   ?  ? ? ?Change in Status:   None ? ?ACTIONS PERFORMED: ?In-Person Delivery, patient was fit with: ?- 1x pair A5500 PDAC approved prefabricated Diabetic Shoes: Apex X821 white sneaker ?- 3x pair [id number] PDAC approved vacuum formed custom diabetic insoles; SafeStep A5514 insoles ? ?Shoes and insoles were verified for structural integrity and safety. Patient wore shoes and insoles in office. Skin was inspected and free of areas of concern after wearing shoes and inserts. Shoes and inserts fit properly. Patient / Caregiver provided with ferbal instruction and demonstration regarding donning, doffing, wear, care, proper fit, function, purpose, cleaning, and use of shoes and insoles ' and in all related precautions and risks and benefits regarding shoes and insoles. Patient / Caregiver was instructed to wear properly fitting socks with shoes at all times. Patient was also provided with verbal instruction regarding how to report any failures or malfunctions of shoes or inserts, and necessary follow up care. Patient / Caregiver was also instructed to contact physician regarding change in status that may affect function of shoes and inserts.  ? ?Patient / Caregiver verbalized undersatnding of instruction provided. Patient / Caregiver demonstrated independence with proper donning and doffing of shoes and inserts. ? ?PLAN ?Patient to follow with treating physician as recommended. Plan of care was discussed with and agreed upon by patient and/or caregiver. All questions  were answered and concerns addressed. ? ?

## 2021-06-30 DIAGNOSIS — E1122 Type 2 diabetes mellitus with diabetic chronic kidney disease: Secondary | ICD-10-CM | POA: Diagnosis not present

## 2021-06-30 DIAGNOSIS — F17210 Nicotine dependence, cigarettes, uncomplicated: Secondary | ICD-10-CM | POA: Diagnosis not present

## 2021-06-30 DIAGNOSIS — E1165 Type 2 diabetes mellitus with hyperglycemia: Secondary | ICD-10-CM | POA: Diagnosis not present

## 2021-06-30 DIAGNOSIS — Z299 Encounter for prophylactic measures, unspecified: Secondary | ICD-10-CM | POA: Diagnosis not present

## 2021-06-30 DIAGNOSIS — N183 Chronic kidney disease, stage 3 unspecified: Secondary | ICD-10-CM | POA: Diagnosis not present

## 2021-06-30 DIAGNOSIS — I10 Essential (primary) hypertension: Secondary | ICD-10-CM | POA: Diagnosis not present

## 2021-07-01 ENCOUNTER — Encounter: Payer: Medicare Other | Admitting: Vascular Surgery

## 2021-07-23 DIAGNOSIS — F17210 Nicotine dependence, cigarettes, uncomplicated: Secondary | ICD-10-CM | POA: Diagnosis not present

## 2021-07-23 DIAGNOSIS — Z743 Need for continuous supervision: Secondary | ICD-10-CM | POA: Diagnosis not present

## 2021-07-23 DIAGNOSIS — M549 Dorsalgia, unspecified: Secondary | ICD-10-CM | POA: Diagnosis not present

## 2021-07-23 DIAGNOSIS — Z20822 Contact with and (suspected) exposure to covid-19: Secondary | ICD-10-CM | POA: Diagnosis not present

## 2021-07-23 DIAGNOSIS — R52 Pain, unspecified: Secondary | ICD-10-CM | POA: Diagnosis not present

## 2021-07-23 DIAGNOSIS — I10 Essential (primary) hypertension: Secondary | ICD-10-CM | POA: Diagnosis not present

## 2021-07-23 DIAGNOSIS — Z885 Allergy status to narcotic agent status: Secondary | ICD-10-CM | POA: Diagnosis not present

## 2021-07-23 DIAGNOSIS — I499 Cardiac arrhythmia, unspecified: Secondary | ICD-10-CM | POA: Diagnosis not present

## 2021-07-23 DIAGNOSIS — R1110 Vomiting, unspecified: Secondary | ICD-10-CM | POA: Diagnosis not present

## 2021-07-23 DIAGNOSIS — R0600 Dyspnea, unspecified: Secondary | ICD-10-CM | POA: Diagnosis not present

## 2021-07-23 DIAGNOSIS — E1165 Type 2 diabetes mellitus with hyperglycemia: Secondary | ICD-10-CM | POA: Diagnosis not present

## 2021-07-23 DIAGNOSIS — E1122 Type 2 diabetes mellitus with diabetic chronic kidney disease: Secondary | ICD-10-CM | POA: Diagnosis not present

## 2021-07-23 DIAGNOSIS — I63239 Cerebral infarction due to unspecified occlusion or stenosis of unspecified carotid arteries: Secondary | ICD-10-CM | POA: Diagnosis not present

## 2021-07-23 DIAGNOSIS — I483 Typical atrial flutter: Secondary | ICD-10-CM | POA: Diagnosis not present

## 2021-07-23 DIAGNOSIS — I471 Supraventricular tachycardia: Secondary | ICD-10-CM | POA: Diagnosis not present

## 2021-07-23 DIAGNOSIS — Z299 Encounter for prophylactic measures, unspecified: Secondary | ICD-10-CM | POA: Diagnosis not present

## 2021-07-28 ENCOUNTER — Ambulatory Visit (INDEPENDENT_AMBULATORY_CARE_PROVIDER_SITE_OTHER): Payer: Medicare Other | Admitting: Adult Health

## 2021-07-28 ENCOUNTER — Encounter: Payer: Self-pay | Admitting: Adult Health

## 2021-07-28 VITALS — BP 165/96 | HR 88 | Ht 69.0 in | Wt 232.0 lb

## 2021-07-28 DIAGNOSIS — Z09 Encounter for follow-up examination after completed treatment for conditions other than malignant neoplasm: Secondary | ICD-10-CM | POA: Diagnosis not present

## 2021-07-28 DIAGNOSIS — H3412 Central retinal artery occlusion, left eye: Secondary | ICD-10-CM | POA: Diagnosis not present

## 2021-07-28 DIAGNOSIS — G44209 Tension-type headache, unspecified, not intractable: Secondary | ICD-10-CM

## 2021-07-28 MED ORDER — TOPIRAMATE 25 MG PO TABS: 25.0000 mg | ORAL_TABLET | Freq: Two times a day (BID) | ORAL | 5 refills | Status: DC

## 2021-07-28 NOTE — Patient Instructions (Addendum)
Start topamax '25mg'$  twice daily - please call after 1 week if no benefit to further increase dose or call earlier with any difficulty tolerating ? ?Ensure you follow up with your eye doctor for further evaluation ? ?Follow up with vascular surgery as scheduled tomorrow - please discuss ongoing duration of both aspirin and plavix. Both aspirin and plavix are not needed from stroke standpoint but may need to continue per vascular surgery standpoint. Once you can stop plavix, please continue aspirin for life ? ?Highly encourage complete tobacco cessation as continued use greatly increases risk of additional strokes  ? ? ? ? ?Follow up in 4 months or call earlier if needed  ? ? ? ? ? ?Topiramate Tablets ?What is this medication? ?TOPIRAMATE (toe PYRE a mate) prevents and controls seizures in people with epilepsy. It may also be used to prevent migraine headaches. It works by calming overactive nerves in your body. ?This medicine may be used for other purposes; ask your health care provider or pharmacist if you have questions. ?COMMON BRAND NAME(S): Topamax, Topiragen ?What should I tell my care team before I take this medication? ?They need to know if you have any of these conditions: ?Bleeding disorder ?Kidney disease ?Lung disease ?Suicidal thoughts, plans or attempt ?An unusual or allergic reaction to topiramate, other medications, foods, dyes, or preservatives ?Pregnant or trying to get pregnant ?Breast-feeding ?How should I use this medication? ?Take this medication by mouth with water. Take it as directed on the prescription label at the same time every day. Do not cut, crush or chew this medicine. Swallow the tablets whole. You can take it with or without food. If it upsets your stomach, take it with food. Keep taking it unless your care team tells you to stop. ?A special MedGuide will be given to you by the pharmacist with each prescription and refill. Be sure to read this information carefully each time. ?Talk  to your care team about the use of this medication in children. While it may be prescribed for children as young as 2 years for selected conditions, precautions do apply. ?Overdosage: If you think you have taken too much of this medicine contact a poison control center or emergency room at once. ?NOTE: This medicine is only for you. Do not share this medicine with others. ?What if I miss a dose? ?If you miss a dose, take it as soon as you can unless it is within 6 hours of the next dose. If it is within 6 hours of the next dose, skip the missed dose. Take the next dose at the normal time. Do not take double or extra doses. ?What may interact with this medication? ?Acetazolamide ?Alcohol ?Antihistamines for allergy, cough, and cold ?Aspirin and aspirin-like medications ?Atropine ?Birth control pills ?Certain medications for anxiety or sleep ?Certain medications for bladder problems like oxybutynin, tolterodine ?Certain medications for depression like amitriptyline, fluoxetine, sertraline ?Certain medications for Parkinson's disease like benztropine, trihexyphenidyl ?Certain medications for seizures like carbamazepine, lamotrigine, phenobarbital, phenytoin, primidone, valproic acid, zonisamide ?Certain medications for stomach problems like dicyclomine, hyoscyamine ?Certain medications for travel sickness like scopolamine ?Certain medications that treat or prevent blood clots like warfarin, enoxaparin, dalteparin, apixaban, dabigatran, and rivaroxaban ?Digoxin ?Diltiazem ?General anesthetics like halothane, isoflurane, methoxyflurane, propofol ?Glyburide ?Hydrochlorothiazide ?Ipratropium ?Lithium ?Medications that relax muscles ?Metformin ?Narcotic medications for pain ?NSAIDs, medications for pain and inflammation, like ibuprofen or naproxen ?Phenothiazines like chlorpromazine, mesoridazine, prochlorperazine, thioridazine ?Pioglitazone ?This list may not describe all possible interactions. Give your health care  provider a list of all the medicines, herbs, non-prescription drugs, or dietary supplements you use. Also tell them if you smoke, drink alcohol, or use illegal drugs. Some items may interact with your medicine. ?What should I watch for while using this medication? ?Visit your care team for regular checks on your progress. Tell your care team if your symptoms do not start to get better or if they get worse. ?Do not suddenly stop taking this medication. You may develop a severe reaction. Your care team will tell you how much medication to take. If your care team wants you to stop the medication, the dose may be slowly lowered over time to avoid any side effects. ?Wear a medical ID bracelet or chain. Carry a card that describes your condition. List the medications and doses you take on the card. ?You may get drowsy or dizzy. Do not drive, use machinery, or do anything that needs mental alertness until you know how this medication affects you. Do not stand up or sit up quickly, especially if you are an older patient. This reduces the risk of dizzy or fainting spells. Alcohol may interfere with the effects of this medication. Avoid alcoholic drinks. ?This medication may cause serious skin reactions. They can happen weeks to months after starting the medication. Contact your care team right away if you notice fevers or flu-like symptoms with a rash. The rash may be red or purple and then turn into blisters or peeling of the skin. Or, you might notice a red rash with swelling of the face, lips or lymph nodes in your neck or under your arms. ?Watch for new or worsening thoughts of suicide or depression. This includes sudden changes in mood, behaviors, or thoughts. These changes can happen at any time but are more common in the beginning of treatment or after a change in dose. Call your care team right away if you experience these thoughts or worsening depression. ?This medication may slow your child's growth if it is taken  for a long time at high doses. Your care team will monitor your child's growth. ?Using this medication for a long time may weaken your bones. The risk of bone fractures may be increased. Talk to your care team about your bone health. ?Do not become pregnant while taking this medication. Hormone forms of birth control may not work as well with this medication. Talk to your care team about other forms of birth control. There is potential for serious harm to an unborn child. Tell your care team right away if you think you might be pregnant. ?What side effects may I notice from receiving this medication? ?Side effects that you should report to your care team as soon as possible: ?Allergic reactions--skin rash, itching, hives, swelling of the face, lips, tongue, or throat ?High acid level--trouble breathing, unusual weakness or fatigue, confusion, headache, fast or irregular heartbeat, nausea, vomiting ?High ammonia level--unusual weakness or fatigue, confusion, loss of appetite, nausea, vomiting, seizures ?Fever that does not go away, decrease in sweat ?Kidney stones--blood in the urine, pain or trouble passing urine, pain in the lower back or sides ?Redness, blistering, peeling or loosening of the skin, including inside the mouth ?Sudden eye pain or change in vision such as blurry vision, seeing halos around lights, vision loss ?Thoughts of suicide or self-harm, worsening mood, feelings of depression ?Side effects that usually do not require medical attention (report to your care team if they continue or are bothersome): ?Burning or tingling sensation in hands or  feet ?Difficulty with paying attention, memory, or speech ?Dizziness ?Drowsiness ?Fatigue ?Loss of appetite with weight loss ?Slow or sluggish movements of the body ?This list may not describe all possible side effects. Call your doctor for medical advice about side effects. You may report side effects to FDA at 1-800-FDA-1088. ?Where should I keep my  medication? ?Keep out of the reach of children and pets. ?Store between 15 and 30 degrees C (59 and 86 degrees F). Protect from moisture. Keep the container tightly closed. Get rid of any unused medication after the
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
## 2021-07-29 ENCOUNTER — Encounter: Payer: Self-pay | Admitting: Vascular Surgery

## 2021-07-29 ENCOUNTER — Ambulatory Visit (INDEPENDENT_AMBULATORY_CARE_PROVIDER_SITE_OTHER): Payer: Medicare Other | Admitting: Vascular Surgery

## 2021-07-29 VITALS — BP 138/72 | HR 96 | Temp 97.9°F | Resp 20 | Ht 69.0 in | Wt 224.0 lb

## 2021-07-29 DIAGNOSIS — I6522 Occlusion and stenosis of left carotid artery: Secondary | ICD-10-CM

## 2021-07-29 DIAGNOSIS — I739 Peripheral vascular disease, unspecified: Secondary | ICD-10-CM
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
## 2021-07-30 DIAGNOSIS — F17210 Nicotine dependence, cigarettes, uncomplicated: Secondary | ICD-10-CM | POA: Diagnosis not present

## 2021-07-30 DIAGNOSIS — I63239 Cerebral infarction due to unspecified occlusion or stenosis of unspecified carotid arteries: Secondary | ICD-10-CM | POA: Diagnosis not present

## 2021-07-30 DIAGNOSIS — I10 Essential (primary) hypertension: Secondary | ICD-10-CM | POA: Diagnosis not present

## 2021-07-30 DIAGNOSIS — I471 Supraventricular tachycardia: Secondary | ICD-10-CM | POA: Diagnosis not present

## 2021-07-30 DIAGNOSIS — Z299 Encounter for prophylactic measures, unspecified: Secondary | ICD-10-CM | POA: Diagnosis not present

## 2021-07-30 DIAGNOSIS — G43909 Migraine, unspecified, not intractable, without status migrainosus: Secondary | ICD-10-CM | POA: Diagnosis not present

## 2021-08-03 ENCOUNTER — Other Ambulatory Visit: Payer: Self-pay | Admitting: *Deleted

## 2021-08-03 DIAGNOSIS — I6522 Occlusion and stenosis of left carotid artery: Secondary | ICD-10-CM

## 2021-08-06 ENCOUNTER — Other Ambulatory Visit: Payer: Self-pay

## 2021-08-06 DIAGNOSIS — L97509 Non-pressure chronic ulcer of other part of unspecified foot with unspecified severity: Secondary | ICD-10-CM

## 2021-08-13 ENCOUNTER — Encounter (HOSPITAL_COMMUNITY)
Admission: RE | Disposition: A | Discharge status: Home / Self Care | Payer: Self-pay | Source: Home / Self Care | Attending: Vascular Surgery

## 2021-08-13 ENCOUNTER — Other Ambulatory Visit: Payer: Self-pay

## 2021-08-13 ENCOUNTER — Ambulatory Visit (HOSPITAL_COMMUNITY)
Admission: RE | Admit: 2021-08-13 | Discharge: 2021-08-14 | Disposition: A | Discharge status: Home / Self Care | Payer: Medicare Other | Attending: Vascular Surgery | Admitting: Vascular Surgery

## 2021-08-13 DIAGNOSIS — I70245 Atherosclerosis of native arteries of left leg with ulceration of other part of foot: Secondary | ICD-10-CM | POA: Diagnosis not present

## 2021-08-13 DIAGNOSIS — Z79899 Other long term (current) drug therapy: Secondary | ICD-10-CM | POA: Diagnosis not present

## 2021-08-13 DIAGNOSIS — L97529 Non-pressure chronic ulcer of other part of left foot with unspecified severity: Secondary | ICD-10-CM | POA: Diagnosis not present

## 2021-08-13 DIAGNOSIS — Z7902 Long term (current) use of antithrombotics/antiplatelets: Secondary | ICD-10-CM | POA: Insufficient documentation

## 2021-08-13 DIAGNOSIS — I6522 Occlusion and stenosis of left carotid artery: Secondary | ICD-10-CM | POA: Diagnosis not present

## 2021-08-13 DIAGNOSIS — I739 Peripheral vascular disease, unspecified: Secondary | ICD-10-CM | POA: Diagnosis present

## 2021-08-13 DIAGNOSIS — L97509 Non-pressure chronic ulcer of other part of unspecified foot with unspecified severity: Secondary | ICD-10-CM

## 2021-08-13 DIAGNOSIS — Z7982 Long term (current) use of aspirin: Secondary | ICD-10-CM | POA: Diagnosis not present

## 2021-08-13 HISTORY — PX: ABDOMINAL AORTOGRAM W/LOWER EXTREMITY: CATH118223

## 2021-08-13 HISTORY — PX: PERIPHERAL VASCULAR INTERVENTION: CATH118257

## 2021-08-13 LAB — CBC
HCT: 47 % (ref 39.0–52.0)
Hemoglobin: 15.7 g/dL (ref 13.0–17.0)
MCH: 30.3 pg (ref 26.0–34.0)
MCHC: 33.4 g/dL (ref 30.0–36.0)
MCV: 90.7 fL (ref 80.0–100.0)
Platelets: 256 10*3/uL (ref 150–400)
RBC: 5.18 MIL/uL (ref 4.22–5.81)
RDW: 13.7 % (ref 11.5–15.5)
WBC: 11.1 10*3/uL — ABNORMAL HIGH (ref 4.0–10.5)
nRBC: 0 % (ref 0.0–0.2)

## 2021-08-13 LAB — GLUCOSE, CAPILLARY: Glucose-Capillary: 113 mg/dL — ABNORMAL HIGH (ref 70–99)

## 2021-08-13 LAB — POCT I-STAT, CHEM 8
BUN: 14 mg/dL (ref 8–23)
Calcium, Ion: 1.23 mmol/L (ref 1.15–1.40)
Chloride: 110 mmol/L (ref 98–111)
Creatinine, Ser: 1 mg/dL (ref 0.61–1.24)
Glucose, Bld: 123 mg/dL — ABNORMAL HIGH (ref 70–99)
HCT: 48 % (ref 39.0–52.0)
Hemoglobin: 16.3 g/dL (ref 13.0–17.0)
Potassium: 4.1 mmol/L (ref 3.5–5.1)
Sodium: 142 mmol/L (ref 135–145)
TCO2: 21 mmol/L — ABNORMAL LOW (ref 22–32)

## 2021-08-13 SURGERY — ABDOMINAL AORTOGRAM W/LOWER EXTREMITY
Anesthesia: LOCAL

## 2021-08-13 MED ORDER — METHYLPREDNISOLONE SODIUM SUCC 125 MG IJ SOLR
125.0000 mg | INTRAMUSCULAR | Status: AC
  Administered 2021-08-13: 125 mg via INTRAVENOUS

## 2021-08-13 MED ORDER — SODIUM CHLORIDE 0.9% FLUSH
3.0000 mL | Freq: Two times a day (BID) | INTRAVENOUS | Status: DC
  Administered 2021-08-13: 3 mL via INTRAVENOUS

## 2021-08-13 MED ORDER — ONDANSETRON HCL 4 MG/2ML IJ SOLN: 4.0000 mg | Freq: Four times a day (QID) | INTRAMUSCULAR | Status: DC | PRN

## 2021-08-13 MED ORDER — IODIXANOL 320 MG/ML IV SOLN
INTRAVENOUS | Status: DC | PRN
  Administered 2021-08-13: 145 mL

## 2021-08-13 MED ORDER — HYDROMORPHONE HCL 1 MG/ML IJ SOLN: 1.0000 mg | Freq: Once | INTRAMUSCULAR | Status: DC

## 2021-08-13 MED ORDER — ASPIRIN 81 MG PO TBEC
81.0000 mg | DELAYED_RELEASE_TABLET | Freq: Every day | ORAL | Status: DC
  Administered 2021-08-14: 81 mg via ORAL
  Filled 2021-08-13: qty 1

## 2021-08-13 MED ORDER — ROSUVASTATIN CALCIUM 20 MG PO TABS
20.0000 mg | ORAL_TABLET | Freq: Every day | ORAL | Status: DC
  Administered 2021-08-13: 20 mg via ORAL
  Filled 2021-08-13: qty 1

## 2021-08-13 MED ORDER — SODIUM CHLORIDE 0.9% FLUSH: 3.0000 mL | INTRAVENOUS | Status: DC | PRN

## 2021-08-13 MED ORDER — FENTANYL CITRATE (PF) 100 MCG/2ML IJ SOLN
INTRAMUSCULAR | Status: AC
  Filled 2021-08-13: qty 2

## 2021-08-13 MED ORDER — SODIUM CHLORIDE 0.9 % IV SOLN: INTRAVENOUS | Status: DC

## 2021-08-13 MED ORDER — DIPHENHYDRAMINE HCL 50 MG/ML IJ SOLN
25.0000 mg | INTRAMUSCULAR | Status: AC
  Administered 2021-08-13: 25 mg via INTRAVENOUS

## 2021-08-13 MED ORDER — HYDROMORPHONE HCL 1 MG/ML IJ SOLN
INTRAMUSCULAR | Status: AC
  Administered 2021-08-13: 1 mg
  Filled 2021-08-13: qty 1

## 2021-08-13 MED ORDER — SODIUM CHLORIDE 0.9 % IV SOLN: 250.0000 mL | INTRAVENOUS | Status: DC | PRN

## 2021-08-13 MED ORDER — HYDRALAZINE HCL 20 MG/ML IJ SOLN: 5.0000 mg | INTRAMUSCULAR | Status: DC | PRN

## 2021-08-13 MED ORDER — DIPHENHYDRAMINE HCL 50 MG/ML IJ SOLN
INTRAMUSCULAR | Status: AC
  Filled 2021-08-13: qty 1

## 2021-08-13 MED ORDER — LABETALOL HCL 5 MG/ML IV SOLN: 10.0000 mg | INTRAVENOUS | Status: DC | PRN

## 2021-08-13 MED ORDER — LIDOCAINE HCL (PF) 1 % IJ SOLN
INTRAMUSCULAR | Status: AC
  Filled 2021-08-13: qty 30

## 2021-08-13 MED ORDER — MIDAZOLAM HCL 2 MG/2ML IJ SOLN
INTRAMUSCULAR | Status: AC
  Filled 2021-08-13: qty 2

## 2021-08-13 MED ORDER — SODIUM CHLORIDE 0.9% FLUSH
3.0000 mL | Freq: Two times a day (BID) | INTRAVENOUS | Status: DC
  Administered 2021-08-14: 3 mL via INTRAVENOUS

## 2021-08-13 MED ORDER — ACETAMINOPHEN 325 MG PO TABS: 650.0000 mg | ORAL_TABLET | ORAL | Status: DC | PRN

## 2021-08-13 MED ORDER — HEPARIN (PORCINE) IN NACL 1000-0.9 UT/500ML-% IV SOLN
INTRAVENOUS | Status: AC
  Filled 2021-08-13: qty 1000

## 2021-08-13 MED ORDER — HYDROCODONE-ACETAMINOPHEN 5-325 MG PO TABS
1.0000 | ORAL_TABLET | ORAL | Status: DC | PRN
  Administered 2021-08-14: 2 via ORAL
  Administered 2021-08-14: 1 via ORAL
  Filled 2021-08-13: qty 2
  Filled 2021-08-13: qty 1

## 2021-08-13 MED ORDER — SODIUM CHLORIDE 0.9 % WEIGHT BASED INFUSION
1.0000 mL/kg/h | INTRAVENOUS | Status: AC
  Administered 2021-08-13: 1 mL/kg/h via INTRAVENOUS

## 2021-08-13 MED ORDER — CLOPIDOGREL BISULFATE 75 MG PO TABS
75.0000 mg | ORAL_TABLET | Freq: Every day | ORAL | Status: DC
  Administered 2021-08-14: 75 mg via ORAL
  Filled 2021-08-13: qty 1

## 2021-08-13 MED ORDER — FENTANYL CITRATE (PF) 100 MCG/2ML IJ SOLN
INTRAMUSCULAR | Status: DC | PRN
  Administered 2021-08-13: 50 ug via INTRAVENOUS

## 2021-08-13 MED ORDER — LIDOCAINE HCL (PF) 1 % IJ SOLN
INTRAMUSCULAR | Status: DC | PRN
  Administered 2021-08-13: 18 mL

## 2021-08-13 MED ORDER — TOPIRAMATE 25 MG PO TABS
25.0000 mg | ORAL_TABLET | Freq: Two times a day (BID) | ORAL | Status: DC
  Administered 2021-08-13 – 2021-08-14 (×2): 25 mg via ORAL
  Filled 2021-08-13 (×2): qty 1

## 2021-08-13 MED ORDER — DULOXETINE HCL 60 MG PO CPEP
60.0000 mg | ORAL_CAPSULE | Freq: Every day | ORAL | Status: DC
  Administered 2021-08-14: 60 mg via ORAL
  Filled 2021-08-13: qty 1

## 2021-08-13 MED ORDER — MIDAZOLAM HCL 2 MG/2ML IJ SOLN
INTRAMUSCULAR | Status: DC | PRN
  Administered 2021-08-13: 1 mg via INTRAVENOUS

## 2021-08-13 MED ORDER — HEPARIN SODIUM (PORCINE) 1000 UNIT/ML IJ SOLN
INTRAMUSCULAR | Status: DC | PRN
  Administered 2021-08-13: 10000 [IU] via INTRAVENOUS

## 2021-08-13 MED ORDER — HEPARIN SODIUM (PORCINE) 1000 UNIT/ML IJ SOLN
INTRAMUSCULAR | Status: AC
  Filled 2021-08-13: qty 10

## 2021-08-13 MED ORDER — METHYLPREDNISOLONE SODIUM SUCC 125 MG IJ SOLR
INTRAMUSCULAR | Status: AC
  Filled 2021-08-13: qty 2

## 2021-08-13 MED ORDER — DILTIAZEM HCL ER COATED BEADS 180 MG PO CP24
180.0000 mg | ORAL_CAPSULE | Freq: Every day | ORAL | Status: DC
  Administered 2021-08-14: 180 mg via ORAL
  Filled 2021-08-13: qty 1

## 2021-08-13 MED ORDER — GABAPENTIN 100 MG PO CAPS
100.0000 mg | ORAL_CAPSULE | Freq: Two times a day (BID) | ORAL | Status: DC
  Administered 2021-08-13 – 2021-08-14 (×2): 100 mg via ORAL
  Filled 2021-08-13 (×2): qty 1

## 2021-08-13 SURGICAL SUPPLY — 20 items
BALLN MUSTANG 6X200X135 (BALLOONS) ×3
BALLOON MUSTANG 6X200X135 (BALLOONS) IMPLANT
CATH MUSTANG 4X200X135 (BALLOONS) ×1 IMPLANT
CATH OMNI FLUSH 5F 65CM (CATHETERS) ×1 IMPLANT
CATH QUICKCROSS .018X135CM (MICROCATHETER) IMPLANT
CATH QUICKCROSS .035X135CM (MICROCATHETER) ×1 IMPLANT
CLOSURE PERCLOSE PROSTYLE (VASCULAR PRODUCTS) ×1 IMPLANT
GLIDEWIRE ADV .035X260CM (WIRE) ×1 IMPLANT
KIT ENCORE 26 ADVANTAGE (KITS) ×1 IMPLANT
KIT MICROPUNCTURE NIT STIFF (SHEATH) ×1 IMPLANT
KIT PV (KITS) ×3 IMPLANT
SHEATH FLEX ANSEL ANG 6F 45CM (SHEATH) ×1 IMPLANT
SHEATH PINNACLE 5F 10CM (SHEATH) ×1 IMPLANT
SHEATH PROBE COVER 6X72 (BAG) ×1 IMPLANT
STENT ELUVIA 7X150X130 (Permanent Stent) ×2 IMPLANT
STENT ELUVIA 7X80X130 (Permanent Stent) ×1 IMPLANT
SYR MEDRAD MARK V 150ML (SYRINGE) ×1 IMPLANT
TRANSDUCER W/STOPCOCK (MISCELLANEOUS) ×3 IMPLANT
TRAY PV CATH (CUSTOM PROCEDURE TRAY) ×3 IMPLANT
WIRE BENTSON .035X145CM (WIRE) ×1 IMPLANT

## 2021-08-13 NOTE — Progress Notes (Signed)
Checked groin after holding pressure and groin firm again; pressure held x 4mn and Dr HStanford Breedin and groin softer after holding pressure

## 2021-08-13 NOTE — Progress Notes (Signed)
Verdene Lennert, RN over to assess patient. FemStop in place. At 1657, femostop pressure at 90 mmHg and reduced to 75 mmHg, per protocol.

## 2021-08-13 NOTE — Progress Notes (Addendum)
Dr Stanford Breed notified of groin hematoma firm again15 x15cm and pressure held x 3mn and groin softer again; Dr HStanford Breedin and ordered femstop right groin

## 2021-08-13 NOTE — Progress Notes (Signed)
Veronica,RN and other cath lab staff in to place femstop

## 2021-08-13 NOTE — Plan of Care (Signed)
  Problem: Education: Goal: Understanding of CV disease, CV risk reduction, and recovery process will improve Outcome: Progressing   Problem: Activity: Goal: Ability to return to baseline activity level will improve Outcome: Progressing   Problem: Cardiovascular: Goal: Ability to achieve and maintain adequate cardiovascular perfusion will improve Outcome: Progressing Goal: Vascular access site(s) Level 0-1 will be maintained Outcome: Progressing   Problem: Health Behavior/Discharge Planning: Goal: Ability to manage health-related needs will improve Outcome: Progressing   Problem: Clinical Measurements: Goal: Cardiovascular complication will be avoided Outcome: Progressing   Problem: Pain Managment: Goal: General experience of comfort will improve Outcome: Progressing   Problem: Skin Integrity: Goal: Risk for impaired skin integrity will decrease Outcome: Progressing

## 2021-08-13 NOTE — Progress Notes (Signed)
Report called to [facility] for [phone]

## 2021-08-13 NOTE — Op Note (Signed)
DATE OF SERVICE: 08/13/2021  PATIENT:  Michael Chambers  61 y.o. male  PRE-OPERATIVE DIAGNOSIS:  Atherosclerosis of native arteries of left lower extremity causing ulceration  POST-OPERATIVE DIAGNOSIS:  Same  PROCEDURE:   1) Ultrasound guided right common femoral artery access 2) Aortogram 3) Left lower extremity angiogram with third order cannulation (161m total contrast) 4) Left femoropopliteal angioplasty and stenting (7x1540mEluvia x 2, 7x8013mluvia) 5) Conscious sedation (53 minutes)  SURGEON:  ThoYevonne AlineawStanford BreedD  ASSISTANT: none  ANESTHESIA:   local and IV sedation  ESTIMATED BLOOD LOSS: minimal  LOCAL MEDICATIONS USED:  LIDOCAINE   COUNTS: confirmed correct.  PATIENT DISPOSITION:  PACU - hemodynamically stable.   Delay start of Pharmacological VTE agent (>24hrs) due to surgical blood loss or risk of bleeding: no  INDICATION FOR PROCEDURE: [name] a 60 66o. male with left foot ulceration in the setting of PAD. After careful discussion of risks, benefits, and alternatives the patient was offered angiography. The patient understood and wished to proceed.  OPERATIVE FINDINGS:  Terminal aorta and iliac arteries: Widely patent without flow-limiting stenosis  Right lower extremity: Common femoral artery: Widely patent without flow-limiting stenosis  Profunda femoris artery: Widely patent without flow-limiting stenosis  Superficial femoral artery: Mid SFA 3-4cm CTO Popliteal artery: Widely patent without flow-limiting stenosis Anterior tibial artery: Widely patent without flow-limiting stenosis Tibioperoneal trunk: Widely patent without flow-limiting stenosis Peroneal artery: Widely patent without flow-limiting stenosis Posterior tibial artery: Widely patent without flow-limiting stenosis Pedal circulation: no visualized disease  Left lower extremity: Common femoral artery: Widely patent without flow-limiting stenosis  Profunda femoris artery: Widely  patent without flow-limiting stenosis  Superficial femoral artery: long segment CTO in mid SFA --> above knee popliteal artery Popliteal artery: reconstitution immediately above and behind the knee. Normal below knee segment Anterior tibial artery: Widely patent without flow-limiting stenosis Tibioperoneal trunk: Widely patent without flow-limiting stenosis Peroneal artery: Widely patent without flow-limiting stenosis Posterior tibial artery: Widely patent without flow-limiting stenosis Pedal circulation: no visualized disease  GLASS score. FP grade IV. IP grade 0. Stage III, high complexity disease.  WIfI score. 1 / 2 / 0. Low amputation risk.  DESCRIPTION OF PROCEDURE: After identification of the patient in the pre-operative holding area, the patient was transferred to the operating room. The patient was positioned supine on the operating room table. Anesthesia was induced. The groins was prepped and draped in standard fashion. A surgical pause was performed confirming correct patient, procedure, and operative location.  The right groin was anesthetized with subcutaneous injection of 1% lidocaine. Using ultrasound guidance, the right common femoral artery was accessed with micropuncture technique. Fluoroscopy was used to confirm cannulation over the femoral head. The 27F sheath was upsized to 37F.   A Benson wire was advanced into the distal aorta. Over the wire an omni flush catheter was advanced to the level of L2. Aortogram was performed - see above for details. Bilateral lower extremity runoff angiography was performed - see above for details.  The left common iliac artery was selected with a glidewire advantage guidewire. The wire was advanced into the common femoral artery. Over the wire the omni flush catheter was advanced into the external iliac artery. Selective angiography was performed - see above for details.   The decision was made to intervene. The patient was heparinized with  10,000 units of heparin. The 37F sheath was exchanged for a 44F x 45cm sheath. Selective angiography of the left lower extremity was performed prior  to intervention.   I was able to cross the heavily diseased femoropopliteal segment subintimally. The lesion was treated with Left femoropopliteal angioplasty and stenting (7x133m Eluvia x 2, 7x81mEluvia)  Completion angiography revealed:  Resolution of femoropopliteal occlusion. Good flow into the trifurcation.  A perclose device was used to close the arteriotomy. Hemostasis was excellent upon completion.  Conscious sedation was administered with the use of IV fentanyl and midazolam under continuous physician and nurse monitoring.  Heart rate, blood pressure, and oxygen saturation were continuously monitored.  Total sedation time was 53 minutes  Upon completion of the case instrument and sharps counts were confirmed correct. The patient was transferred to the PACU in good condition. I was present for all portions of the procedure.  PLAN: Continue ASA / Plavix / Statin. Follow up with me or Dr. DiScot Dockn 1 month with an ABI and LLE duplex.  ThYevonne AlineHaStanford BreedMD [facility] Number: ([phone]/19/2023 12:25 PM

## 2021-08-13 NOTE — Discharge Instructions (Addendum)
Femoral Site Care This sheet gives you information about how to care for yourself after your procedure. Your health care provider may also give you more specific instructions. If you have problems or questions, contact your health care provider. What can I expect after the procedure?  After the procedure, it is common to have: Bruising that usually fades within 1-2 weeks. Tenderness at the site. Follow these instructions at home: Wound care Follow instructions from your health care provider about how to take care of your insertion site. Make sure you: Wash your hands with soap and water before you change your bandage (dressing). If soap and water are not available, use hand sanitizer. Remove your dressing as told by your health care provider. In 24 hours Do not take baths, swim, or use a hot tub until your health care provider approves. You may shower 24-48 hours after the procedure or as told by your health care provider. Gently wash the site with plain soap and water. Pat the area dry with a clean towel. Do not rub the site. This may cause bleeding. Do not apply powder or lotion to the site. Keep the site clean and dry. Check your femoral site every day for signs of infection. Check for: Redness, swelling, or pain. Fluid or blood. Warmth. Pus or a bad smell. Activity For the first 2-3 days after your procedure, or as long as directed: Avoid climbing stairs as much as possible. Do not squat. Do not lift anything that is heavier than 10 lb (4.5 kg), or the limit that you are told, until your health care provider says that it is safe. For 5 days Rest as directed. Avoid sitting for a long time without moving. Get up to take short walks every 1-2 hours. Do not drive for 24 hours if you were given a medicine to help you relax (sedative). General instructions Take over-the-counter and prescription medicines only as told by your health care provider. Keep all follow-up visits as told by  your health care provider. This is important. Contact a health care provider if you have: A fever or chills. You have redness, swelling, or pain around your insertion site. Get help right away if: The catheter insertion area swells very fast. You pass out. You suddenly start to sweat or your skin gets clammy. The catheter insertion area is bleeding, and the bleeding does not stop when you hold steady pressure on the area. The area near or just beyond the catheter insertion site becomes pale, cool, tingly, or numb. These symptoms may represent a serious problem that is an emergency. Do not wait to see if the symptoms will go away. Get medical help right away. Call your local emergency services (911 in the [place] Do not drive yourself to the hospital. Summary After the procedure, it is common to have bruising that usually fades within 1-2 weeks. Check your femoral site every day for signs of infection. Do not lift anything that is heavier than 10 lb (4.5 kg), or the limit that you are told, until your health care provider says that it is safe. This information is not intended to replace advice given to you by your health care provider. Make sure you discuss any questions you have with your health care provider. Document Revised: 03/27/2017 Document Reviewed: 03/27/2017 Elsevier Patient Education  2020 [employer].   [facility]  Discharge Instructions  Lower Extremity Angiogram; Angioplasty/Stenting  Please refer to the following instructions for your post-procedure care. Your  surgeon or physician assistant will discuss any changes with you.  Activity  Avoid lifting more than 8 pounds (1 gallons of milk) for 5 days after your procedure. You may walk as much as you can tolerate. It's OK to drive after 72 hours.  Bathing/Showering  You may shower the day after your procedure. If you have a bandage, you may remove it at 24- 48 hours. Clean your incision  site with mild soap and water. Pat the area dry with a clean towel.  Diet  Resume your pre-procedure diet. There are no special food restrictions following this procedure. All patients with peripheral vascular disease should follow a low fat/low cholesterol diet. In order to heal from your surgery, it is CRITICAL to get adequate nutrition. Your body requires vitamins, minerals, and protein. Vegetables are the best source of vitamins and minerals. Vegetables also provide the perfect balance of protein. Processed food has little nutritional value, so try to avoid this.  Medications  Resume taking all of your medications unless your doctor tells you not to. If your incision is causing pain, you may take over-the-counter pain relievers such as acetaminophen (Tylenol)  Follow Up  Follow up will be arranged at the time of your procedure. You may have an office visit scheduled or may be scheduled for surgery. Ask your surgeon if you have any questions.  Please call us immediately for any of the following conditions: Severe or worsening pain your legs or feet at rest or with walking. Increased pain, redness, drainage at your groin puncture site. Fever of 101 degrees or higher. If you have any mild or slow bleeding from your puncture site: lie down, apply firm constant pressure over the area with a piece of gauze or a clean wash cloth for 30 minutes- no peeking!, call 911 right away if you are still bleeding after 30 minutes, or if the bleeding is heavy and unmanageable.  Reduce your risk factors of vascular disease:  Stop smoking. If you would like help call [facility] at 1-800-QUIT-NOW 575-092-3054) or [place] at (929) 268-7619. Manage your cholesterol Maintain a desired weight Control your diabetes Keep your blood pressure down  If you have any questions, please call the office at [phone]

## 2021-08-13 NOTE — Progress Notes (Signed)
Client up to bathroom and hematoma noted 10cmx 10cm after walking; pressure held x 47mn and Dr HStanford Breedin and held pressure; groin softer after holding pressure

## 2021-08-13 NOTE — Interval H&P Note (Signed)
History and Physical Interval Note:  08/13/2021 11:12 AM  Michael Chambers  has presented today for surgery, with the diagnosis of PAD with ulcer.  The various methods of treatment have been discussed with the patient and family. After consideration of risks, benefits and other options for treatment, the patient has consented to  Procedure(s): ABDOMINAL AORTOGRAM W/LOWER EXTREMITY (N/A) as a surgical intervention.  The patient's history has been reviewed, patient examined, no change in status, stable for surgery.  I have reviewed the patient's chart and labs.  Questions were answered to the patient's satisfaction.     Cherre Robins

## 2021-08-14 DIAGNOSIS — I70245 Atherosclerosis of native arteries of left leg with ulceration of other part of foot: Secondary | ICD-10-CM | POA: Diagnosis not present

## 2021-08-14 DIAGNOSIS — I6522 Occlusion and stenosis of left carotid artery: Secondary | ICD-10-CM | POA: Diagnosis not present

## 2021-08-14 DIAGNOSIS — Z7982 Long term (current) use of aspirin: Secondary | ICD-10-CM | POA: Diagnosis not present

## 2021-08-14 DIAGNOSIS — L97529 Non-pressure chronic ulcer of other part of left foot with unspecified severity: Secondary | ICD-10-CM | POA: Diagnosis not present

## 2021-08-14 DIAGNOSIS — Z79899 Other long term (current) drug therapy: Secondary | ICD-10-CM | POA: Diagnosis not present

## 2021-08-14 DIAGNOSIS — Z7902 Long term (current) use of antithrombotics/antiplatelets: Secondary | ICD-10-CM | POA: Diagnosis not present

## 2021-08-14 LAB — COMPREHENSIVE METABOLIC PANEL
ALT: 17 U/L (ref 0–44)
AST: 14 U/L — ABNORMAL LOW (ref 15–41)
Albumin: 3 g/dL — ABNORMAL LOW (ref 3.5–5.0)
Alkaline Phosphatase: 64 U/L (ref 38–126)
Anion gap: 7 (ref 5–15)
BUN: 18 mg/dL (ref 8–23)
CO2: 20 mmol/L — ABNORMAL LOW (ref 22–32)
Calcium: 8.6 mg/dL — ABNORMAL LOW (ref 8.9–10.3)
Chloride: 111 mmol/L (ref 98–111)
Creatinine, Ser: 1.21 mg/dL (ref 0.61–1.24)
GFR, Estimated: >60 mL/min (ref >60)
Glucose, Bld: 217 mg/dL — ABNORMAL HIGH (ref 70–99)
Potassium: 4.2 mmol/L (ref 3.5–5.1)
Sodium: 138 mmol/L (ref 135–145)
Total Bilirubin: 0.5 mg/dL (ref 0.3–1.2)
Total Protein: 5.5 g/dL — ABNORMAL LOW (ref 6.5–8.1)

## 2021-08-14 LAB — CBC
HCT: 40.5 % (ref 39.0–52.0)
Hemoglobin: 13.5 g/dL (ref 13.0–17.0)
MCH: 30.3 pg (ref 26.0–34.0)
MCHC: 33.3 g/dL (ref 30.0–36.0)
MCV: 91 fL (ref 80.0–100.0)
Platelets: 188 10*3/uL (ref 150–400)
RBC: 4.45 MIL/uL (ref 4.22–5.81)
RDW: 13.6 % (ref 11.5–15.5)
WBC: 15.9 10*3/uL — ABNORMAL HIGH (ref 4.0–10.5)
nRBC: 0 % (ref 0.0–0.2)

## 2021-08-14 LAB — LIPID PANEL
Cholesterol: 122 mg/dL (ref 0–200)
HDL: 35 mg/dL — ABNORMAL LOW (ref >40)
LDL Cholesterol: 70 mg/dL (ref 0–99)
Total CHOL/HDL Ratio: 3.5 RATIO
Triglycerides: 86 mg/dL (ref <150)
VLDL: 17 mg/dL (ref 0–40)

## 2021-08-14 NOTE — Discharge Summary (Signed)
Physician Discharge Summary  Patient ID: Michael Chambers MRN: [id number] DOB/AGE: 1960-12-21 61 y.o.  Admit date: 08/13/2021 Discharge date: 08/14/2021  Admission Diagnoses: PAD  Discharge Diagnoses:  Principal Problem:   Peripheral arterial disease [facility])   Discharged Condition: good  Hospital Course: Patient admitted for observation after peripheral catheterization for left lower extremity critical limb ischemia.  The patient developed a hematoma this procedure upon ambulating.  Manual pressure was held.  Hematoma softened over the course of the evening.  On the morning of postprocedure day 2 the patient had necrobiotic groin which is soft.  He drop in his hemoglobin, but his hemodynamics were stable overnight.  He was found to be safe for discharge.  Consults: None  Significant Diagnostic Studies: angiography: See angiography report.  Good result from intervention.  Treatments: Inpatient observation  Discharge Exam: Blood pressure 108/72, pulse 86, temperature 98.1 F (36.7 C), temperature source Oral, resp. rate 19, height '5\' 9"'$  (1.753 m), weight 103.9 kg, SpO2 94 %. Constitutional: Well appearing man in no acute distress Cardiac: Regular rate and rhythm.  Respiratory: unlabored. Abdominal: soft, non-tender, non-distended.  Peripheral vascular:  Ecchymotic right groin.  Soft.  Nontender.  Weakly palpable left PT pulse Extremity: No edema. No cyanosis. No pallor.  Skin: No gangrene. No ulceration.  Lymphatic: No Stemmer's sign. No palpable lymphadenopathy.   Disposition: Discharge disposition: 01-Home or Self Care       Discharge Instructions     Call MD for:  redness, tenderness, or signs of infection (pain, swelling, redness, odor or green/yellow discharge around incision site)   Complete by: As directed    Call MD for:  severe uncontrolled pain   Complete by: As directed    Call MD for:  temperature >100.4   Complete by: As directed    Diet - low sodium heart  healthy   Complete by: As directed    Discharge patient   Complete by: As directed    Discharge disposition: 01-Home or Self Care   Discharge patient date: 08/14/2021   Discharge patient   Complete by: As directed    Discharge disposition: 01-Home or Self Care   Discharge patient date: 08/14/2021   Discharge wound care:   Complete by: As directed    Wash groin with mild soap and water, pat dry. Do not soak in bathtub, pool, etc   Increase activity slowly   Complete by: As directed    Lifting restrictions   Complete by: As directed    No heavy lifting, pushing, pulling for 2 weeks      Allergies as of 08/14/2021       Reactions   Dimetapp Cold-allergy [brompheniramine-phenylephrine]    hives   Brompheniramine Hives   Chlorpheniramine Hives   Dextromethorphan Hives   Ibuprofen Nausea And Vomiting   Ivp Dye [iodinated Contrast Media] Itching        Medication List     TAKE these medications    aspirin EC 81 MG tablet Take 1 tablet (81 mg total) by mouth daily at 6 (six) AM. Swallow whole.   clopidogrel 75 MG tablet Commonly known as: PLAVIX Take 1 tablet (75 mg total) by mouth daily at 6 (six) AM.   diltiazem 180 MG 24 hr capsule Commonly known as: CARDIZEM CD Take 180 mg by mouth daily.   DULoxetine 60 MG capsule Commonly known as: CYMBALTA Take 60 mg by mouth daily.   gabapentin 100 MG capsule Commonly known as: NEURONTIN Take 100 mg by mouth  2 (two) times daily.   hydrocerin Crea Apply 1 application. topically daily.   HYDROcodone-acetaminophen 5-325 MG tablet Commonly known as: NORCO/VICODIN Take 1 tablet by mouth 2 (two) times daily.   rosuvastatin 20 MG tablet Commonly known as: Crestor Take 1 tablet (20 mg total) by mouth at bedtime.   topiramate 25 MG tablet Commonly known as: TOPAMAX Take 1 tablet (25 mg total) by mouth 2 (two) times daily.               Discharge Care Instructions  (From admission, onward)           Start      Ordered   08/14/21 0000  Discharge wound care:       Comments: Wash groin with mild soap and water, pat dry. Do not soak in bathtub, pool, etc   08/14/21 0541            Follow-up Information     [facility] Follow up in 1 month(s).   Why: The office will call the patient with an appointment Contact information: [place] [phone]                Signed: Cherre Robins 08/14/2021, 5:41 AM

## 2021-08-14 NOTE — Progress Notes (Signed)
Seen on rounds by T. Luan Pulling, MD. Continue Aspirin and Plavix per verbal order.

## 2021-08-16 ENCOUNTER — Encounter (HOSPITAL_COMMUNITY): Payer: Self-pay | Admitting: Vascular Surgery

## 2021-08-20 DIAGNOSIS — M549 Dorsalgia, unspecified: Secondary | ICD-10-CM | POA: Diagnosis not present

## 2021-08-20 DIAGNOSIS — I700 Atherosclerosis of aorta: Secondary | ICD-10-CM | POA: Diagnosis not present

## 2021-08-20 DIAGNOSIS — I10 Essential (primary) hypertension: Secondary | ICD-10-CM | POA: Diagnosis not present

## 2021-08-20 DIAGNOSIS — I471 Supraventricular tachycardia: Secondary | ICD-10-CM | POA: Diagnosis not present

## 2021-08-20 DIAGNOSIS — Z299 Encounter for prophylactic measures, unspecified: Secondary | ICD-10-CM | POA: Diagnosis not present

## 2021-09-07 ENCOUNTER — Other Ambulatory Visit: Payer: Self-pay | Admitting: *Deleted

## 2021-09-07 DIAGNOSIS — I739 Peripheral vascular disease, unspecified: Secondary | ICD-10-CM

## 2021-09-07 DIAGNOSIS — L97509 Non-pressure chronic ulcer of other part of unspecified foot with unspecified severity: Secondary | ICD-10-CM

## 2021-09-16 ENCOUNTER — Encounter: Payer: Self-pay | Admitting: Vascular Surgery

## 2021-09-16 ENCOUNTER — Ambulatory Visit (HOSPITAL_COMMUNITY)
Admission: RE | Admit: 2021-09-16 | Discharge: 2021-09-16 | Disposition: A | Discharge status: Home / Self Care | Payer: Medicare Other | Source: Ambulatory Visit | Attending: Vascular Surgery | Admitting: Vascular Surgery

## 2021-09-16 ENCOUNTER — Ambulatory Visit (INDEPENDENT_AMBULATORY_CARE_PROVIDER_SITE_OTHER): Payer: Medicare Other | Admitting: Vascular Surgery

## 2021-09-16 ENCOUNTER — Ambulatory Visit (INDEPENDENT_AMBULATORY_CARE_PROVIDER_SITE_OTHER)
Admit: 2021-09-16 | Discharge: 2021-09-16 | Disposition: A | Discharge status: Home / Self Care | Payer: Medicare Other | Attending: Vascular Surgery | Admitting: Vascular Surgery

## 2021-09-16 VITALS — BP 137/81 | HR 90 | Temp 98.3°F | Resp 20 | Ht 69.0 in | Wt 238.0 lb

## 2021-09-16 DIAGNOSIS — I7025 Atherosclerosis of native arteries of other extremities with ulceration: Secondary | ICD-10-CM

## 2021-09-16 DIAGNOSIS — I739 Peripheral vascular disease, unspecified: Secondary | ICD-10-CM

## 2021-09-16 DIAGNOSIS — L97509 Non-pressure chronic ulcer of other part of unspecified foot with unspecified severity: Secondary | ICD-10-CM

## 2021-09-17 ENCOUNTER — Other Ambulatory Visit: Payer: Self-pay

## 2021-09-17 DIAGNOSIS — I739 Peripheral vascular disease, unspecified: Secondary | ICD-10-CM

## 2021-09-24 DIAGNOSIS — I471 Supraventricular tachycardia: Secondary | ICD-10-CM | POA: Diagnosis not present

## 2021-09-24 DIAGNOSIS — N183 Chronic kidney disease, stage 3 unspecified: Secondary | ICD-10-CM | POA: Diagnosis not present

## 2021-09-24 DIAGNOSIS — I700 Atherosclerosis of aorta: Secondary | ICD-10-CM | POA: Diagnosis not present

## 2021-09-24 DIAGNOSIS — M549 Dorsalgia, unspecified: Secondary | ICD-10-CM | POA: Diagnosis not present

## 2021-09-24 DIAGNOSIS — Z7189 Other specified counseling: Secondary | ICD-10-CM | POA: Diagnosis not present

## 2021-09-24 DIAGNOSIS — Z299 Encounter for prophylactic measures, unspecified: Secondary | ICD-10-CM | POA: Diagnosis not present

## 2021-09-24 DIAGNOSIS — Z0000 Encounter for general adult medical examination without abnormal findings: Secondary | ICD-10-CM | POA: Diagnosis not present

## 2021-09-24 DIAGNOSIS — I7090 Unspecified atherosclerosis: Secondary | ICD-10-CM | POA: Diagnosis not present

## 2021-09-24 DIAGNOSIS — I10 Essential (primary) hypertension: Secondary | ICD-10-CM | POA: Diagnosis not present

## 2021-11-10 DIAGNOSIS — I10 Essential (primary) hypertension: Secondary | ICD-10-CM | POA: Diagnosis not present

## 2021-11-10 DIAGNOSIS — F17210 Nicotine dependence, cigarettes, uncomplicated: Secondary | ICD-10-CM | POA: Diagnosis not present

## 2021-11-10 DIAGNOSIS — M549 Dorsalgia, unspecified: Secondary | ICD-10-CM | POA: Diagnosis not present

## 2021-11-10 DIAGNOSIS — E1165 Type 2 diabetes mellitus with hyperglycemia: Secondary | ICD-10-CM | POA: Diagnosis not present

## 2021-11-10 DIAGNOSIS — Z299 Encounter for prophylactic measures, unspecified: Secondary | ICD-10-CM | POA: Diagnosis not present

## 2021-11-25 DIAGNOSIS — I10 Essential (primary) hypertension: Secondary | ICD-10-CM | POA: Diagnosis not present

## 2021-11-25 DIAGNOSIS — I4891 Unspecified atrial fibrillation: Secondary | ICD-10-CM | POA: Diagnosis not present
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
## 2021-12-06 ENCOUNTER — Ambulatory Visit: Payer: Medicare Other | Admitting: Adult Health

## 2021-12-06 ENCOUNTER — Encounter: Payer: Self-pay | Admitting: Adult Health

## 2021-12-14 DIAGNOSIS — Z299 Encounter for prophylactic measures, unspecified: Secondary | ICD-10-CM | POA: Diagnosis not present

## 2021-12-14 DIAGNOSIS — M549 Dorsalgia, unspecified: Secondary | ICD-10-CM | POA: Diagnosis not present

## 2021-12-14 DIAGNOSIS — I10 Essential (primary) hypertension: Secondary | ICD-10-CM | POA: Diagnosis not present

## 2021-12-14 DIAGNOSIS — I471 Supraventricular tachycardia: Secondary | ICD-10-CM | POA: Diagnosis not present

## 2021-12-14 DIAGNOSIS — E7800 Pure hypercholesterolemia, unspecified: Secondary | ICD-10-CM | POA: Diagnosis not present

## 2022-01-13 DIAGNOSIS — I10 Essential (primary) hypertension: Secondary | ICD-10-CM | POA: Diagnosis not present

## 2022-01-13 DIAGNOSIS — M549 Dorsalgia, unspecified: Secondary | ICD-10-CM | POA: Diagnosis not present

## 2022-01-13 DIAGNOSIS — F17210 Nicotine dependence, cigarettes, uncomplicated: Secondary | ICD-10-CM | POA: Diagnosis not present

## 2022-01-13 DIAGNOSIS — Z299 Encounter for prophylactic measures, unspecified: Secondary | ICD-10-CM | POA: Diagnosis not present

## 2022-01-13 DIAGNOSIS — H938X2 Other specified disorders of left ear: Secondary | ICD-10-CM | POA: Diagnosis not present

## 2022-02-14 DIAGNOSIS — M549 Dorsalgia, unspecified: Secondary | ICD-10-CM | POA: Diagnosis not present

## 2022-02-14 DIAGNOSIS — I10 Essential (primary) hypertension: Secondary | ICD-10-CM | POA: Diagnosis not present

## 2022-02-14 DIAGNOSIS — E1165 Type 2 diabetes mellitus with hyperglycemia: Secondary | ICD-10-CM | POA: Diagnosis not present

## 2022-02-14 DIAGNOSIS — Z299 Encounter for prophylactic measures, unspecified: Secondary | ICD-10-CM | POA: Diagnosis not present

## 2022-02-14 DIAGNOSIS — E1140 Type 2 diabetes mellitus with diabetic neuropathy, unspecified: Secondary | ICD-10-CM | POA: Diagnosis not present

## 2022-03-16 DIAGNOSIS — I10 Essential (primary) hypertension: Secondary | ICD-10-CM | POA: Diagnosis not present

## 2022-03-16 DIAGNOSIS — F32A Depression, unspecified: Secondary | ICD-10-CM | POA: Diagnosis not present

## 2022-03-16 DIAGNOSIS — Z299 Encounter for prophylactic measures, unspecified: Secondary | ICD-10-CM | POA: Diagnosis not present

## 2022-03-16 DIAGNOSIS — E1140 Type 2 diabetes mellitus with diabetic neuropathy, unspecified: Secondary | ICD-10-CM | POA: Diagnosis not present

## 2022-03-16 DIAGNOSIS — M549 Dorsalgia, unspecified: Secondary | ICD-10-CM | POA: Diagnosis not present

## 2022-04-15 DIAGNOSIS — E1140 Type 2 diabetes mellitus with diabetic neuropathy, unspecified: Secondary | ICD-10-CM | POA: Diagnosis not present

## 2022-04-15 DIAGNOSIS — I10 Essential (primary) hypertension: Secondary | ICD-10-CM | POA: Diagnosis not present

## 2022-04-15 DIAGNOSIS — Z299 Encounter for prophylactic measures, unspecified: Secondary | ICD-10-CM | POA: Diagnosis not present

## 2022-04-15 DIAGNOSIS — M549 Dorsalgia, unspecified: Secondary | ICD-10-CM | POA: Diagnosis not present

## 2022-04-15 DIAGNOSIS — F17210 Nicotine dependence, cigarettes, uncomplicated: Secondary | ICD-10-CM | POA: Diagnosis not present

## 2022-05-16 DIAGNOSIS — I471 Supraventricular tachycardia, unspecified: Secondary | ICD-10-CM | POA: Diagnosis not present

## 2022-05-16 DIAGNOSIS — Z299 Encounter for prophylactic measures, unspecified: Secondary | ICD-10-CM | POA: Diagnosis not present

## 2022-05-16 DIAGNOSIS — F17210 Nicotine dependence, cigarettes, uncomplicated: Secondary | ICD-10-CM | POA: Diagnosis not present

## 2022-05-16 DIAGNOSIS — M549 Dorsalgia, unspecified: Secondary | ICD-10-CM | POA: Diagnosis not present

## 2022-05-16 DIAGNOSIS — E1122 Type 2 diabetes mellitus with diabetic chronic kidney disease: Secondary | ICD-10-CM | POA: Diagnosis not present

## 2022-05-16 DIAGNOSIS — E1165 Type 2 diabetes mellitus with hyperglycemia: Secondary | ICD-10-CM | POA: Diagnosis not present

## 2022-05-16 DIAGNOSIS — I10 Essential (primary) hypertension: Secondary | ICD-10-CM | POA: Diagnosis not present

## 2022-06-14 DIAGNOSIS — I498 Other specified cardiac arrhythmias: Secondary | ICD-10-CM | POA: Diagnosis not present

## 2022-06-14 DIAGNOSIS — M542 Cervicalgia: Secondary | ICD-10-CM | POA: Diagnosis not present

## 2022-06-14 DIAGNOSIS — E7800 Pure hypercholesterolemia, unspecified: Secondary | ICD-10-CM | POA: Diagnosis not present

## 2022-06-14 DIAGNOSIS — I10 Essential (primary) hypertension: Secondary | ICD-10-CM | POA: Diagnosis not present

## 2022-06-14 DIAGNOSIS — Z299 Encounter for prophylactic measures, unspecified: Secondary | ICD-10-CM | POA: Diagnosis not present

## 2022-06-28 DIAGNOSIS — I63239 Cerebral infarction due to unspecified occlusion or stenosis of unspecified carotid arteries: Secondary | ICD-10-CM | POA: Diagnosis not present

## 2022-06-28 DIAGNOSIS — I471 Supraventricular tachycardia, unspecified: Secondary | ICD-10-CM | POA: Diagnosis not present

## 2022-06-28 DIAGNOSIS — M549 Dorsalgia, unspecified: Secondary | ICD-10-CM | POA: Diagnosis not present

## 2022-06-28 DIAGNOSIS — I10 Essential (primary) hypertension: Secondary | ICD-10-CM | POA: Diagnosis not present

## 2022-06-28 DIAGNOSIS — Z299 Encounter for prophylactic measures, unspecified: Secondary | ICD-10-CM | POA: Diagnosis not present

## 2022-06-30 ENCOUNTER — Ambulatory Visit: Payer: 59 | Admitting: Cardiology

## 2022-08-12 DIAGNOSIS — M549 Dorsalgia, unspecified: Secondary | ICD-10-CM | POA: Diagnosis not present

## 2022-08-12 DIAGNOSIS — I10 Essential (primary) hypertension: Secondary | ICD-10-CM | POA: Diagnosis not present

## 2022-08-12 DIAGNOSIS — Z299 Encounter for prophylactic measures, unspecified: Secondary | ICD-10-CM | POA: Diagnosis not present

## 2022-09-12 DIAGNOSIS — M549 Dorsalgia, unspecified: Secondary | ICD-10-CM | POA: Diagnosis not present

## 2022-09-12 DIAGNOSIS — E1122 Type 2 diabetes mellitus with diabetic chronic kidney disease: Secondary | ICD-10-CM | POA: Diagnosis not present

## 2022-09-12 DIAGNOSIS — E1165 Type 2 diabetes mellitus with hyperglycemia: Secondary | ICD-10-CM | POA: Diagnosis not present

## 2022-09-12 DIAGNOSIS — I10 Essential (primary) hypertension: Secondary | ICD-10-CM | POA: Diagnosis not present

## 2022-09-12 DIAGNOSIS — Z299 Encounter for prophylactic measures, unspecified: Secondary | ICD-10-CM | POA: Diagnosis not present

## 2022-09-12 DIAGNOSIS — N183 Chronic kidney disease, stage 3 unspecified: Secondary | ICD-10-CM | POA: Diagnosis not present

## 2022-10-12 ENCOUNTER — Telehealth: Payer: Self-pay

## 2022-10-12 DIAGNOSIS — E1140 Type 2 diabetes mellitus with diabetic neuropathy, unspecified: Secondary | ICD-10-CM | POA: Diagnosis not present

## 2022-10-12 DIAGNOSIS — I63239 Cerebral infarction due to unspecified occlusion or stenosis of unspecified carotid arteries: Secondary | ICD-10-CM | POA: Diagnosis not present

## 2022-10-12 DIAGNOSIS — I10 Essential (primary) hypertension: Secondary | ICD-10-CM | POA: Diagnosis not present

## 2022-10-12 DIAGNOSIS — M549 Dorsalgia, unspecified: Secondary | ICD-10-CM | POA: Diagnosis not present

## 2022-10-12 DIAGNOSIS — Z299 Encounter for prophylactic measures, unspecified: Secondary | ICD-10-CM | POA: Diagnosis not present

## 2022-10-12 NOTE — Telephone Encounter (Signed)
Dr. Lorette Ang called requesting an appt considering the pt's presenting symptoms in her office at this time. She states that when the pt had an infarction and subsequent carotid endarterectomy on the L side, he had a pulsating, throbbing sensation. He is know presenting with the same sensation on the R side. Also, his BP is elevated, reading 168/100, despite his Cardizem and Amlodipine. She stated that she couldn't get his BP well controlled last time and she fears that is happening again.  Pt has transportation issues, so he will call back to confirm appt offered him for 7/18. He also needs a carotid US which she is ordering, unless he can have it done here in office. Confirmed understanding.

## 2022-11-04 DIAGNOSIS — H524 Presbyopia: Secondary | ICD-10-CM | POA: Diagnosis not present

## 2022-11-04 DIAGNOSIS — H2513 Age-related nuclear cataract, bilateral: Secondary | ICD-10-CM | POA: Diagnosis not present

## 2022-11-04 DIAGNOSIS — E119 Type 2 diabetes mellitus without complications: Secondary | ICD-10-CM | POA: Diagnosis not present

## 2022-11-04 DIAGNOSIS — H52221 Regular astigmatism, right eye: Secondary | ICD-10-CM | POA: Diagnosis not present

## 2022-11-04 DIAGNOSIS — R6889 Other general symptoms and signs: Secondary | ICD-10-CM | POA: Diagnosis not present

## 2022-11-04 DIAGNOSIS — H3412 Central retinal artery occlusion, left eye: Secondary | ICD-10-CM | POA: Diagnosis not present

## 2022-11-11 DIAGNOSIS — I63239 Cerebral infarction due to unspecified occlusion or stenosis of unspecified carotid arteries: Secondary | ICD-10-CM | POA: Diagnosis not present

## 2022-11-11 DIAGNOSIS — Z299 Encounter for prophylactic measures, unspecified: Secondary | ICD-10-CM | POA: Diagnosis not present

## 2022-11-11 DIAGNOSIS — I10 Essential (primary) hypertension: Secondary | ICD-10-CM | POA: Diagnosis not present

## 2022-11-11 DIAGNOSIS — E1165 Type 2 diabetes mellitus with hyperglycemia: Secondary | ICD-10-CM | POA: Diagnosis not present

## 2022-11-11 DIAGNOSIS — M549 Dorsalgia, unspecified: Secondary | ICD-10-CM | POA: Diagnosis not present

## 2022-12-13 DIAGNOSIS — R5383 Other fatigue: Secondary | ICD-10-CM | POA: Diagnosis not present

## 2022-12-13 DIAGNOSIS — Z1331 Encounter for screening for depression: Secondary | ICD-10-CM | POA: Diagnosis not present

## 2022-12-13 DIAGNOSIS — E7800 Pure hypercholesterolemia, unspecified: Secondary | ICD-10-CM | POA: Diagnosis not present

## 2022-12-13 DIAGNOSIS — Z1339 Encounter for screening examination for other mental health and behavioral disorders: Secondary | ICD-10-CM | POA: Diagnosis not present

## 2022-12-13 DIAGNOSIS — I10 Essential (primary) hypertension: Secondary | ICD-10-CM | POA: Diagnosis not present

## 2022-12-13 DIAGNOSIS — Z0000 Encounter for general adult medical examination without abnormal findings: Secondary | ICD-10-CM | POA: Diagnosis not present

## 2022-12-13 DIAGNOSIS — Z7189 Other specified counseling: Secondary | ICD-10-CM | POA: Diagnosis not present

## 2022-12-13 DIAGNOSIS — F339 Major depressive disorder, recurrent, unspecified: Secondary | ICD-10-CM | POA: Diagnosis not present

## 2022-12-13 DIAGNOSIS — Z299 Encounter for prophylactic measures, unspecified: Secondary | ICD-10-CM | POA: Diagnosis not present

## 2023-01-12 DIAGNOSIS — M549 Dorsalgia, unspecified: Secondary | ICD-10-CM | POA: Diagnosis not present

## 2023-01-12 DIAGNOSIS — R6889 Other general symptoms and signs: Secondary | ICD-10-CM | POA: Diagnosis not present

## 2023-01-12 DIAGNOSIS — Z299 Encounter for prophylactic measures, unspecified: Secondary | ICD-10-CM | POA: Diagnosis not present

## 2023-01-12 DIAGNOSIS — Z79899 Other long term (current) drug therapy: Secondary | ICD-10-CM | POA: Diagnosis not present

## 2023-01-12 DIAGNOSIS — E1165 Type 2 diabetes mellitus with hyperglycemia: Secondary | ICD-10-CM | POA: Diagnosis not present

## 2023-01-12 DIAGNOSIS — I10 Essential (primary) hypertension: Secondary | ICD-10-CM | POA: Diagnosis not present

## 2023-02-13 DIAGNOSIS — E1165 Type 2 diabetes mellitus with hyperglycemia: Secondary | ICD-10-CM | POA: Diagnosis not present

## 2023-02-13 DIAGNOSIS — I10 Essential (primary) hypertension: Secondary | ICD-10-CM | POA: Diagnosis not present

## 2023-02-13 DIAGNOSIS — I471 Supraventricular tachycardia, unspecified: Secondary | ICD-10-CM | POA: Diagnosis not present

## 2023-02-13 DIAGNOSIS — M79662 Pain in left lower leg: Secondary | ICD-10-CM | POA: Diagnosis not present

## 2023-02-13 DIAGNOSIS — R609 Edema, unspecified: Secondary | ICD-10-CM | POA: Diagnosis not present

## 2023-02-13 DIAGNOSIS — M7989 Other specified soft tissue disorders: Secondary | ICD-10-CM | POA: Diagnosis not present

## 2023-02-13 DIAGNOSIS — R6889 Other general symptoms and signs: Secondary | ICD-10-CM | POA: Diagnosis not present

## 2023-02-13 DIAGNOSIS — M549 Dorsalgia, unspecified: Secondary | ICD-10-CM | POA: Diagnosis not present

## 2023-02-13 DIAGNOSIS — Z299 Encounter for prophylactic measures, unspecified: Secondary | ICD-10-CM | POA: Diagnosis not present

## 2023-02-13 DIAGNOSIS — Z79899 Other long term (current) drug therapy: Secondary | ICD-10-CM | POA: Diagnosis not present

## 2023-03-14 DIAGNOSIS — I10 Essential (primary) hypertension: Secondary | ICD-10-CM | POA: Diagnosis not present

## 2023-03-14 DIAGNOSIS — E1122 Type 2 diabetes mellitus with diabetic chronic kidney disease: Secondary | ICD-10-CM | POA: Diagnosis not present

## 2023-03-14 DIAGNOSIS — R6889 Other general symptoms and signs: Secondary | ICD-10-CM | POA: Diagnosis not present

## 2023-03-14 DIAGNOSIS — M549 Dorsalgia, unspecified: Secondary | ICD-10-CM | POA: Diagnosis not present

## 2023-03-14 DIAGNOSIS — Z79899 Other long term (current) drug therapy: Secondary | ICD-10-CM | POA: Diagnosis not present

## 2023-03-14 DIAGNOSIS — Z299 Encounter for prophylactic measures, unspecified: Secondary | ICD-10-CM | POA: Diagnosis not present

## 2023-04-11 DIAGNOSIS — E7800 Pure hypercholesterolemia, unspecified: Secondary | ICD-10-CM | POA: Diagnosis not present

## 2023-04-11 DIAGNOSIS — I10 Essential (primary) hypertension: Secondary | ICD-10-CM | POA: Diagnosis not present

## 2023-04-11 DIAGNOSIS — Z299 Encounter for prophylactic measures, unspecified: Secondary | ICD-10-CM | POA: Diagnosis not present

## 2023-04-11 DIAGNOSIS — Z79899 Other long term (current) drug therapy: Secondary | ICD-10-CM | POA: Diagnosis not present

## 2023-04-11 DIAGNOSIS — N183 Chronic kidney disease, stage 3 unspecified: Secondary | ICD-10-CM | POA: Diagnosis not present

## 2023-04-11 DIAGNOSIS — M549 Dorsalgia, unspecified: Secondary | ICD-10-CM | POA: Diagnosis not present

## 2023-04-11 DIAGNOSIS — D696 Thrombocytopenia, unspecified: Secondary | ICD-10-CM | POA: Diagnosis not present

## 2023-04-15 DIAGNOSIS — M86072 Acute hematogenous osteomyelitis, left ankle and foot: Secondary | ICD-10-CM | POA: Diagnosis not present

## 2023-04-15 DIAGNOSIS — F329 Major depressive disorder, single episode, unspecified: Secondary | ICD-10-CM | POA: Diagnosis not present

## 2023-04-15 DIAGNOSIS — E119 Type 2 diabetes mellitus without complications: Secondary | ICD-10-CM

## 2023-04-15 DIAGNOSIS — M7752 Other enthesopathy of left foot: Secondary | ICD-10-CM | POA: Diagnosis not present

## 2023-04-15 DIAGNOSIS — F32A Depression, unspecified: Secondary | ICD-10-CM | POA: Diagnosis not present

## 2023-04-15 DIAGNOSIS — I96 Gangrene, not elsewhere classified: Secondary | ICD-10-CM | POA: Diagnosis not present

## 2023-04-15 DIAGNOSIS — E11621 Type 2 diabetes mellitus with foot ulcer: Secondary | ICD-10-CM | POA: Diagnosis not present

## 2023-04-15 DIAGNOSIS — I739 Peripheral vascular disease, unspecified: Secondary | ICD-10-CM | POA: Diagnosis not present

## 2023-04-15 DIAGNOSIS — L97529 Non-pressure chronic ulcer of other part of left foot with unspecified severity: Secondary | ICD-10-CM | POA: Diagnosis not present

## 2023-04-15 DIAGNOSIS — E1140 Type 2 diabetes mellitus with diabetic neuropathy, unspecified: Secondary | ICD-10-CM | POA: Diagnosis not present

## 2023-04-15 DIAGNOSIS — E1152 Type 2 diabetes mellitus with diabetic peripheral angiopathy with gangrene: Secondary | ICD-10-CM | POA: Diagnosis not present

## 2023-04-15 DIAGNOSIS — G629 Polyneuropathy, unspecified: Secondary | ICD-10-CM | POA: Insufficient documentation

## 2023-04-15 DIAGNOSIS — S99922A Unspecified injury of left foot, initial encounter: Secondary | ICD-10-CM | POA: Diagnosis not present

## 2023-04-15 DIAGNOSIS — F17219 Nicotine dependence, cigarettes, with unspecified nicotine-induced disorders: Secondary | ICD-10-CM | POA: Diagnosis not present

## 2023-04-15 DIAGNOSIS — I10 Essential (primary) hypertension: Secondary | ICD-10-CM | POA: Diagnosis not present

## 2023-04-15 DIAGNOSIS — F17210 Nicotine dependence, cigarettes, uncomplicated: Secondary | ICD-10-CM | POA: Diagnosis not present

## 2023-04-15 DIAGNOSIS — E1169 Type 2 diabetes mellitus with other specified complication: Secondary | ICD-10-CM | POA: Diagnosis not present

## 2023-04-15 DIAGNOSIS — M869 Osteomyelitis, unspecified: Secondary | ICD-10-CM | POA: Diagnosis not present

## 2023-04-16 ENCOUNTER — Inpatient Hospital Stay (HOSPITAL_COMMUNITY)
Admission: AD | Admit: 2023-04-16 | Discharge: 2023-04-27 | DRG: 240 | Disposition: A | Discharge status: Home Health | Payer: Medicare HMO | Source: Other Acute Inpatient Hospital | Attending: Internal Medicine | Admitting: Internal Medicine

## 2023-04-16 ENCOUNTER — Other Ambulatory Visit: Payer: Self-pay

## 2023-04-16 ENCOUNTER — Encounter (HOSPITAL_COMMUNITY): Payer: Self-pay

## 2023-04-16 ENCOUNTER — Encounter (HOSPITAL_COMMUNITY): Payer: Self-pay | Admitting: Internal Medicine

## 2023-04-16 DIAGNOSIS — Z886 Allergy status to analgesic agent status: Secondary | ICD-10-CM

## 2023-04-16 DIAGNOSIS — Z7902 Long term (current) use of antithrombotics/antiplatelets: Secondary | ICD-10-CM

## 2023-04-16 DIAGNOSIS — L089 Local infection of the skin and subcutaneous tissue, unspecified: Secondary | ICD-10-CM | POA: Diagnosis not present

## 2023-04-16 DIAGNOSIS — Z79899 Other long term (current) drug therapy: Secondary | ICD-10-CM

## 2023-04-16 DIAGNOSIS — I2510 Atherosclerotic heart disease of native coronary artery without angina pectoris: Secondary | ICD-10-CM | POA: Diagnosis present

## 2023-04-16 DIAGNOSIS — Z5982 Transportation insecurity: Secondary | ICD-10-CM

## 2023-04-16 DIAGNOSIS — Z560 Unemployment, unspecified: Secondary | ICD-10-CM

## 2023-04-16 DIAGNOSIS — I959 Hypotension, unspecified: Secondary | ICD-10-CM | POA: Diagnosis not present

## 2023-04-16 DIAGNOSIS — Z9889 Other specified postprocedural states: Secondary | ICD-10-CM | POA: Diagnosis not present

## 2023-04-16 DIAGNOSIS — F329 Major depressive disorder, single episode, unspecified: Secondary | ICD-10-CM | POA: Diagnosis not present

## 2023-04-16 DIAGNOSIS — I7090 Unspecified atherosclerosis: Secondary | ICD-10-CM | POA: Diagnosis not present

## 2023-04-16 DIAGNOSIS — E8809 Other disorders of plasma-protein metabolism, not elsewhere classified: Secondary | ICD-10-CM | POA: Diagnosis not present

## 2023-04-16 DIAGNOSIS — Z7901 Long term (current) use of anticoagulants: Secondary | ICD-10-CM

## 2023-04-16 DIAGNOSIS — I96 Gangrene, not elsewhere classified: Secondary | ICD-10-CM | POA: Diagnosis not present

## 2023-04-16 DIAGNOSIS — J449 Chronic obstructive pulmonary disease, unspecified: Secondary | ICD-10-CM | POA: Diagnosis present

## 2023-04-16 DIAGNOSIS — E871 Hypo-osmolality and hyponatremia: Secondary | ICD-10-CM | POA: Diagnosis not present

## 2023-04-16 DIAGNOSIS — Z5941 Food insecurity: Secondary | ICD-10-CM

## 2023-04-16 DIAGNOSIS — M19072 Primary osteoarthritis, left ankle and foot: Secondary | ICD-10-CM | POA: Diagnosis not present

## 2023-04-16 DIAGNOSIS — I739 Peripheral vascular disease, unspecified: Secondary | ICD-10-CM | POA: Diagnosis not present

## 2023-04-16 DIAGNOSIS — T82898A Other specified complication of vascular prosthetic devices, implants and grafts, initial encounter: Secondary | ICD-10-CM | POA: Diagnosis not present

## 2023-04-16 DIAGNOSIS — E1151 Type 2 diabetes mellitus with diabetic peripheral angiopathy without gangrene: Secondary | ICD-10-CM | POA: Diagnosis not present

## 2023-04-16 DIAGNOSIS — E876 Hypokalemia: Secondary | ICD-10-CM | POA: Diagnosis present

## 2023-04-16 DIAGNOSIS — E1152 Type 2 diabetes mellitus with diabetic peripheral angiopathy with gangrene: Principal | ICD-10-CM | POA: Diagnosis present

## 2023-04-16 DIAGNOSIS — I70203 Unspecified atherosclerosis of native arteries of extremities, bilateral legs: Secondary | ICD-10-CM | POA: Diagnosis present

## 2023-04-16 DIAGNOSIS — I483 Typical atrial flutter: Secondary | ICD-10-CM | POA: Diagnosis present

## 2023-04-16 DIAGNOSIS — Z91041 Radiographic dye allergy status: Secondary | ICD-10-CM

## 2023-04-16 DIAGNOSIS — M869 Osteomyelitis, unspecified: Secondary | ICD-10-CM | POA: Diagnosis present

## 2023-04-16 DIAGNOSIS — E1140 Type 2 diabetes mellitus with diabetic neuropathy, unspecified: Secondary | ICD-10-CM | POA: Diagnosis not present

## 2023-04-16 DIAGNOSIS — Z720 Tobacco use: Secondary | ICD-10-CM | POA: Diagnosis not present

## 2023-04-16 DIAGNOSIS — L97521 Non-pressure chronic ulcer of other part of left foot limited to breakdown of skin: Secondary | ICD-10-CM | POA: Diagnosis present

## 2023-04-16 DIAGNOSIS — F1410 Cocaine abuse, uncomplicated: Secondary | ICD-10-CM | POA: Diagnosis not present

## 2023-04-16 DIAGNOSIS — Z888 Allergy status to other drugs, medicaments and biological substances status: Secondary | ICD-10-CM

## 2023-04-16 DIAGNOSIS — F17210 Nicotine dependence, cigarettes, uncomplicated: Secondary | ICD-10-CM | POA: Diagnosis not present

## 2023-04-16 DIAGNOSIS — D72829 Elevated white blood cell count, unspecified: Secondary | ICD-10-CM | POA: Diagnosis present

## 2023-04-16 DIAGNOSIS — M549 Dorsalgia, unspecified: Secondary | ICD-10-CM | POA: Diagnosis present

## 2023-04-16 DIAGNOSIS — L97509 Non-pressure chronic ulcer of other part of unspecified foot with unspecified severity: Secondary | ICD-10-CM | POA: Diagnosis not present

## 2023-04-16 DIAGNOSIS — I10 Essential (primary) hypertension: Secondary | ICD-10-CM | POA: Diagnosis not present

## 2023-04-16 DIAGNOSIS — E11628 Type 2 diabetes mellitus with other skin complications: Secondary | ICD-10-CM | POA: Diagnosis present

## 2023-04-16 DIAGNOSIS — I6522 Occlusion and stenosis of left carotid artery: Secondary | ICD-10-CM | POA: Diagnosis present

## 2023-04-16 DIAGNOSIS — E66811 Obesity, class 1: Secondary | ICD-10-CM | POA: Diagnosis present

## 2023-04-16 DIAGNOSIS — Z6835 Body mass index (BMI) 35.0-35.9, adult: Secondary | ICD-10-CM

## 2023-04-16 DIAGNOSIS — S99929A Unspecified injury of unspecified foot, initial encounter: Secondary | ICD-10-CM | POA: Diagnosis present

## 2023-04-16 DIAGNOSIS — F17200 Nicotine dependence, unspecified, uncomplicated: Secondary | ICD-10-CM | POA: Diagnosis not present

## 2023-04-16 DIAGNOSIS — E785 Hyperlipidemia, unspecified: Secondary | ICD-10-CM | POA: Diagnosis present

## 2023-04-16 DIAGNOSIS — Z5986 Financial insecurity: Secondary | ICD-10-CM

## 2023-04-16 DIAGNOSIS — R600 Localized edema: Secondary | ICD-10-CM | POA: Diagnosis present

## 2023-04-16 DIAGNOSIS — M7989 Other specified soft tissue disorders: Secondary | ICD-10-CM | POA: Diagnosis not present

## 2023-04-16 DIAGNOSIS — E66812 Obesity, class 2: Secondary | ICD-10-CM | POA: Diagnosis not present

## 2023-04-16 DIAGNOSIS — M62262 Nontraumatic ischemic infarction of muscle, left lower leg: Secondary | ICD-10-CM | POA: Diagnosis not present

## 2023-04-16 DIAGNOSIS — Y831 Surgical operation with implant of artificial internal device as the cause of abnormal reaction of the patient, or of later complication, without mention of misadventure at the time of the procedure: Secondary | ICD-10-CM | POA: Diagnosis present

## 2023-04-16 DIAGNOSIS — Z8249 Family history of ischemic heart disease and other diseases of the circulatory system: Secondary | ICD-10-CM

## 2023-04-16 DIAGNOSIS — Z7982 Long term (current) use of aspirin: Secondary | ICD-10-CM

## 2023-04-16 DIAGNOSIS — D62 Acute posthemorrhagic anemia: Secondary | ICD-10-CM | POA: Diagnosis not present

## 2023-04-16 DIAGNOSIS — E119 Type 2 diabetes mellitus without complications: Secondary | ICD-10-CM

## 2023-04-16 DIAGNOSIS — E11621 Type 2 diabetes mellitus with foot ulcer: Secondary | ICD-10-CM | POA: Diagnosis not present

## 2023-04-16 DIAGNOSIS — Z833 Family history of diabetes mellitus: Secondary | ICD-10-CM

## 2023-04-16 DIAGNOSIS — I70202 Unspecified atherosclerosis of native arteries of extremities, left leg: Secondary | ICD-10-CM | POA: Diagnosis not present

## 2023-04-16 DIAGNOSIS — K219 Gastro-esophageal reflux disease without esophagitis: Secondary | ICD-10-CM | POA: Diagnosis present

## 2023-04-16 HISTORY — DX: Type 2 diabetes mellitus without complications: E11.9

## 2023-04-16 LAB — GLUCOSE, CAPILLARY: Glucose-Capillary: 205 mg/dL — ABNORMAL HIGH (ref 70–99)

## 2023-04-16 LAB — HIV ANTIBODY (ROUTINE TESTING W REFLEX): HIV Screen 4th Generation wRfx: NONREACTIVE

## 2023-04-16 LAB — HEMOGLOBIN A1C
Hgb A1c MFr Bld: 7.6 % — ABNORMAL HIGH (ref 4.8–5.6)
Mean Plasma Glucose: 171.42 mg/dL

## 2023-04-16 MED ORDER — CLOPIDOGREL BISULFATE 75 MG PO TABS: 75.0000 mg | ORAL_TABLET | Freq: Every day | ORAL | Status: DC

## 2023-04-16 MED ORDER — CLOPIDOGREL BISULFATE 75 MG PO TABS
75.0000 mg | ORAL_TABLET | Freq: Every day | ORAL | Status: DC
  Filled 2023-04-16 (×2): qty 1

## 2023-04-16 MED ORDER — VANCOMYCIN HCL IN DEXTROSE 1-5 GM/200ML-% IV SOLN
1000.0000 mg | Freq: Two times a day (BID) | INTRAVENOUS | Status: DC
  Administered 2023-04-16 – 2023-04-27 (×19): 1000 mg via INTRAVENOUS
  Filled 2023-04-16 (×19): qty 200

## 2023-04-16 MED ORDER — GABAPENTIN 100 MG PO CAPS
100.0000 mg | ORAL_CAPSULE | Freq: Two times a day (BID) | ORAL | Status: DC
  Administered 2023-04-16 – 2023-04-27 (×20): 100 mg via ORAL
  Filled 2023-04-16 (×21): qty 1

## 2023-04-16 MED ORDER — ASPIRIN 81 MG PO TBEC: 81.0000 mg | DELAYED_RELEASE_TABLET | Freq: Every day | ORAL | Status: DC

## 2023-04-16 MED ORDER — INSULIN ASPART 100 UNIT/ML IJ SOLN
0.0000 [IU] | Freq: Three times a day (TID) | INTRAMUSCULAR | Status: DC
Start: 2023-04-17 — End: 2023-04-27
  Administered 2023-04-17: 5 [IU] via SUBCUTANEOUS
  Administered 2023-04-17: 2 [IU] via SUBCUTANEOUS
  Administered 2023-04-17: 3 [IU] via SUBCUTANEOUS
  Administered 2023-04-18: 11 [IU] via SUBCUTANEOUS
  Administered 2023-04-18: 3 [IU] via SUBCUTANEOUS
  Administered 2023-04-18: 2 [IU] via SUBCUTANEOUS
  Administered 2023-04-19 – 2023-04-20 (×3): 3 [IU] via SUBCUTANEOUS
  Administered 2023-04-20: 2 [IU] via SUBCUTANEOUS
  Administered 2023-04-21 (×3): 3 [IU] via SUBCUTANEOUS
  Administered 2023-04-22 – 2023-04-23 (×4): 2 [IU] via SUBCUTANEOUS
  Administered 2023-04-23: 3 [IU] via SUBCUTANEOUS
  Administered 2023-04-23: 2 [IU] via SUBCUTANEOUS
  Administered 2023-04-24 (×2): 3 [IU] via SUBCUTANEOUS
  Administered 2023-04-24 – 2023-04-25 (×2): 2 [IU] via SUBCUTANEOUS
  Administered 2023-04-25: 3 [IU] via SUBCUTANEOUS
  Administered 2023-04-25: 2 [IU] via SUBCUTANEOUS
  Administered 2023-04-26: 3 [IU] via SUBCUTANEOUS
  Administered 2023-04-26: 2 [IU] via SUBCUTANEOUS
  Administered 2023-04-26 – 2023-04-27 (×2): 3 [IU] via SUBCUTANEOUS

## 2023-04-16 MED ORDER — HYDROCODONE-ACETAMINOPHEN 5-325 MG PO TABS
1.0000 | ORAL_TABLET | Freq: Four times a day (QID) | ORAL | Status: DC | PRN
  Administered 2023-04-16 – 2023-04-17 (×3): 1 via ORAL
  Administered 2023-04-18 – 2023-04-20 (×3): 2 via ORAL
  Administered 2023-04-20: 1 via ORAL
  Administered 2023-04-20 – 2023-04-27 (×21): 2 via ORAL
  Filled 2023-04-16 (×2): qty 2
  Filled 2023-04-16: qty 1
  Filled 2023-04-16 (×4): qty 2
  Filled 2023-04-16: qty 1
  Filled 2023-04-16 (×21): qty 2

## 2023-04-16 MED ORDER — LISINOPRIL 20 MG PO TABS
40.0000 mg | ORAL_TABLET | Freq: Every day | ORAL | Status: DC
  Administered 2023-04-17 – 2023-04-20 (×2): 40 mg via ORAL
  Filled 2023-04-16 (×3): qty 2

## 2023-04-16 MED ORDER — SODIUM CHLORIDE 0.9% FLUSH
3.0000 mL | Freq: Two times a day (BID) | INTRAVENOUS | Status: DC
  Administered 2023-04-16 – 2023-04-25 (×13): 3 mL via INTRAVENOUS

## 2023-04-16 MED ORDER — ROSUVASTATIN CALCIUM 20 MG PO TABS
20.0000 mg | ORAL_TABLET | Freq: Every day | ORAL | Status: DC
  Administered 2023-04-16 – 2023-04-26 (×11): 20 mg via ORAL
  Filled 2023-04-16 (×12): qty 1

## 2023-04-16 MED ORDER — METRONIDAZOLE 500 MG/100ML IV SOLN
500.0000 mg | Freq: Two times a day (BID) | INTRAVENOUS | Status: DC
  Administered 2023-04-16 – 2023-04-18 (×4): 500 mg via INTRAVENOUS
  Filled 2023-04-16 (×4): qty 100

## 2023-04-16 MED ORDER — DULOXETINE HCL 60 MG PO CPEP
60.0000 mg | ORAL_CAPSULE | Freq: Every day | ORAL | Status: DC
Start: 2023-04-17 — End: 2023-04-27
  Administered 2023-04-17 – 2023-04-27 (×9): 60 mg via ORAL
  Filled 2023-04-16 (×10): qty 1

## 2023-04-16 MED ORDER — SODIUM CHLORIDE 0.9 % IV SOLN
2.0000 g | Freq: Two times a day (BID) | INTRAVENOUS | Status: DC
  Administered 2023-04-16 – 2023-04-21 (×10): 2 g via INTRAVENOUS
  Filled 2023-04-16 (×11): qty 12.5

## 2023-04-16 MED ORDER — MAGNESIUM HYDROXIDE 400 MG/5ML PO SUSP: 30.0000 mL | Freq: Every day | ORAL | Status: DC | PRN

## 2023-04-16 MED ORDER — ACETAMINOPHEN 650 MG RE SUPP: 650.0000 mg | Freq: Four times a day (QID) | RECTAL | Status: DC | PRN

## 2023-04-16 MED ORDER — ACETAMINOPHEN 325 MG PO TABS: 650.0000 mg | ORAL_TABLET | Freq: Four times a day (QID) | ORAL | Status: DC | PRN

## 2023-04-16 NOTE — Progress Notes (Signed)
Pharmacy Antibiotic Note  Michael Chambers is a 63 y.o. male admitted on 04/16/2023 with  diabetic foot infection w/ gangrene .  Pharmacy has been consulted for vancomycin, cefepime dosing.  Scr from 1/19 at OSH: 1.01  CrCl >100 ml/min  Cefepime last dose at OSH 1/19 @0443  Vanc 2g dose at OSH 1/19 @0000   Plan:  Vancomycin 1000mg  IV q12h (eAUC 556, Scr 1.01) -goal AUC 400-600, trough 15-101mcg/mL Cefepime 2g IV q 8h Flagyl per MD F/u renal func, LOT, culture data   Weight: 109.3 kg (241 lb)  No data recorded.  No results for input(s): "WBC", "CREATININE", "LATICACIDVEN", "VANCOTROUGH", "VANCOPEAK", "VANCORANDOM", "GENTTROUGH", "GENTPEAK", "GENTRANDOM", "TOBRATROUGH", "TOBRAPEAK", "TOBRARND", "AMIKACINPEAK", "AMIKACINTROU", "AMIKACIN" in the last 168 hours.  CrCl cannot be calculated (Patient's most recent lab result is older than the maximum 21 days allowed.).    Allergies  Allergen Reactions   Dimetapp Cold-Allergy [Brompheniramine-Phenylephrine]     hives   Brompheniramine Hives   Chlorpheniramine Hives   Dextromethorphan Hives   Ibuprofen Nausea And Vomiting   Ivp Dye [Iodinated Contrast Media] Itching    Antimicrobials this admission ([facility]): -per MD started therapy at OSH on 1/18 Vanc 1/19> Cefepime 1/19> Flagyl 1/19>  Dose adjustments this admission:   Microbiology results:   Thank you for allowing pharmacy to be a part of this patient's care.  Calton Dach, PharmD, BCCCP Clinical Pharmacist 04/16/2023 5:45 PM

## 2023-04-16 NOTE — Plan of Care (Signed)
Transfer from [facility] Mr. Janusz is a 63 year old male with pmh HTN, diabetes mellitus, PAD s/p prior stenting who presented with left great toe gangrene.  Case have been discussed with orthopedics, but they did not feel comfortable doing any kind of surgery as vascular surgery is not available at their facility and questions possible need of revascularization for appropriate wound healing.  Also outside facility does not have the possibility of MRI until tomorrow to rule out possible osteomyelitis.  Transfer requested due to these issues.  Patient's vital signs were noted to be stable.  Had previously been followed by podiatry and it appears he still followed by vascular surgery.  Dr. Logan Bores of podiatry to be consulted if excepted patient to be transferred to a telemetry bed as inpatient.
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
## 2023-04-17 ENCOUNTER — Other Ambulatory Visit: Payer: Self-pay

## 2023-04-17 ENCOUNTER — Inpatient Hospital Stay (HOSPITAL_COMMUNITY): Payer: Medicaid Other

## 2023-04-17 ENCOUNTER — Inpatient Hospital Stay (HOSPITAL_COMMUNITY): Payer: Medicare HMO

## 2023-04-17 DIAGNOSIS — I10 Essential (primary) hypertension: Secondary | ICD-10-CM | POA: Diagnosis not present

## 2023-04-17 DIAGNOSIS — E66812 Obesity, class 2: Secondary | ICD-10-CM | POA: Diagnosis not present

## 2023-04-17 DIAGNOSIS — I7090 Unspecified atherosclerosis: Secondary | ICD-10-CM | POA: Diagnosis not present

## 2023-04-17 DIAGNOSIS — I96 Gangrene, not elsewhere classified: Secondary | ICD-10-CM

## 2023-04-17 DIAGNOSIS — E11621 Type 2 diabetes mellitus with foot ulcer: Secondary | ICD-10-CM

## 2023-04-17 DIAGNOSIS — L97509 Non-pressure chronic ulcer of other part of unspecified foot with unspecified severity: Secondary | ICD-10-CM

## 2023-04-17 DIAGNOSIS — Z6835 Body mass index (BMI) 35.0-35.9, adult: Secondary | ICD-10-CM

## 2023-04-17 DIAGNOSIS — M62262 Nontraumatic ischemic infarction of muscle, left lower leg: Secondary | ICD-10-CM | POA: Diagnosis not present

## 2023-04-17 DIAGNOSIS — I739 Peripheral vascular disease, unspecified: Secondary | ICD-10-CM

## 2023-04-17 DIAGNOSIS — F1410 Cocaine abuse, uncomplicated: Secondary | ICD-10-CM | POA: Diagnosis not present

## 2023-04-17 DIAGNOSIS — E11628 Type 2 diabetes mellitus with other skin complications: Secondary | ICD-10-CM | POA: Diagnosis not present

## 2023-04-17 DIAGNOSIS — E66811 Obesity, class 1: Secondary | ICD-10-CM | POA: Diagnosis present

## 2023-04-17 LAB — CBC
HCT: 43.3 % (ref 39.0–52.0)
Hemoglobin: 14.5 g/dL (ref 13.0–17.0)
MCH: 29.1 pg (ref 26.0–34.0)
MCHC: 33.5 g/dL (ref 30.0–36.0)
MCV: 86.8 fL (ref 80.0–100.0)
Platelets: 374 10*3/uL (ref 150–400)
RBC: 4.99 MIL/uL (ref 4.22–5.81)
RDW: 13.2 % (ref 11.5–15.5)
WBC: 11.6 10*3/uL — ABNORMAL HIGH (ref 4.0–10.5)
nRBC: 0 % (ref 0.0–0.2)

## 2023-04-17 LAB — COMPREHENSIVE METABOLIC PANEL
ALT: 27 U/L (ref 0–44)
AST: 26 U/L (ref 15–41)
Albumin: 2.9 g/dL — ABNORMAL LOW (ref 3.5–5.0)
Alkaline Phosphatase: 72 U/L (ref 38–126)
Anion gap: 7 (ref 5–15)
BUN: 7 mg/dL — ABNORMAL LOW (ref 8–23)
CO2: 25 mmol/L (ref 22–32)
Calcium: 8.8 mg/dL — ABNORMAL LOW (ref 8.9–10.3)
Chloride: 101 mmol/L (ref 98–111)
Creatinine, Ser: 0.84 mg/dL (ref 0.61–1.24)
GFR, Estimated: >60 mL/min (ref >60)
Glucose, Bld: 180 mg/dL — ABNORMAL HIGH (ref 70–99)
Potassium: 3.7 mmol/L (ref 3.5–5.1)
Sodium: 133 mmol/L — ABNORMAL LOW (ref 135–145)
Total Bilirubin: 0.9 mg/dL (ref 0.0–1.2)
Total Protein: 6.1 g/dL — ABNORMAL LOW (ref 6.5–8.1)

## 2023-04-17 LAB — GLUCOSE, CAPILLARY
Glucose-Capillary: 210 mg/dL — ABNORMAL HIGH (ref 70–99)
Glucose-Capillary: 128 mg/dL — ABNORMAL HIGH (ref 70–99)
Glucose-Capillary: 151 mg/dL — ABNORMAL HIGH (ref 70–99)
Glucose-Capillary: 185 mg/dL — ABNORMAL HIGH (ref 70–99)

## 2023-04-17 LAB — VAS US ABI WITH/WO TBI
Left ABI: 0.44
Right ABI: 0.81

## 2023-04-17 MED ORDER — GADOBUTROL 1 MMOL/ML IV SOLN
10.0000 mL | Freq: Once | INTRAVENOUS | Status: AC | PRN
  Administered 2023-04-17: 10 mL via INTRAVENOUS

## 2023-04-17 MED ORDER — NICOTINE 21 MG/24HR TD PT24
21.0000 mg | MEDICATED_PATCH | Freq: Every day | TRANSDERMAL | Status: DC
  Administered 2023-04-17 – 2023-04-27 (×9): 21 mg via TRANSDERMAL
  Filled 2023-04-17 (×10): qty 1

## 2023-04-17 MED ORDER — ONDANSETRON 4 MG PO TBDP
4.0000 mg | ORAL_TABLET | Freq: Once | ORAL | Status: AC | PRN
  Administered 2023-04-17: 4 mg via ORAL
  Filled 2023-04-17: qty 1

## 2023-04-17 NOTE — Progress Notes (Signed)
VASCULAR LAB    Left lower extremity arterial duplex has been completed.  See CV proc for preliminary results.   Jaelyn Bourgoin, RVT 04/17/2023, 11:29 AM

## 2023-04-17 NOTE — H&P (View-Only) (Signed)
  Progress Note    04/17/2023 10:36 AM * No surgery found *  Subjective:  no complaints   Vitals:   04/17/23 0451 04/17/23 0849  BP: (!) 155/86 131/82  Pulse: 80 90  Resp:    Temp: 98.1 F (36.7 C) 97.8 F (36.6 C)  SpO2: 96% 94%   Physical Exam: Cardiac:  regular Lungs:  non labored Extremities:  No palpable pedal pulses in left foot. Gangrene of left great toe, ischemic changes to left 2nd toe Neurologic: alert and oriented  CBC    Component Value Date/Time   WBC 11.6 (H) 04/17/2023 0610   RBC 4.99 04/17/2023 0610   HGB 14.5 04/17/2023 0610   HCT 43.3 04/17/2023 0610   PLT 374 04/17/2023 0610   MCV 86.8 04/17/2023 0610   MCH 29.1 04/17/2023 0610   MCHC 33.5 04/17/2023 0610   RDW 13.2 04/17/2023 0610   LYMPHSABS 3.8 06/01/2021 0052   MONOABS 0.8 06/01/2021 0052   EOSABS 0.2 06/01/2021 0052   BASOSABS 0.1 06/01/2021 0052    BMET    Component Value Date/Time   NA 133 (L) 04/17/2023 0610   K 3.7 04/17/2023 0610   CL 101 04/17/2023 0610   CO2 25 04/17/2023 0610   GLUCOSE 180 (H) 04/17/2023 0610   BUN 7 (L) 04/17/2023 0610   CREATININE 0.84 04/17/2023 0610   CALCIUM 8.8 (L) 04/17/2023 0610   GFRNONAA >60 04/17/2023 0610   GFRAA >60 03/16/2016 0859    INR    Component Value Date/Time   INR 1.0 05/31/2021 1117     Intake/Output Summary (Last 24 hours) at 04/17/2023 1036 Last data filed at 04/17/2023 0745 Gross per 24 hour  Intake 393.33 ml  Output 1350 ml  Net -956.67 ml     Assessment/Plan:  63 y.o. male with left great toe gangrene and likely popliteal stent occlusion  No change in left great toe gangrene Non invasive vascular studies are still pending Scheduled for Angiogram tomorrow in the cath lab with Dr. Myra Gianotti Keep NPO after midnight Consent order placed  Corrina Edgewater, PA-C [facility] [phone] 04/17/2023 10:36 AM  I have seen and evaluated the patient. I agree with the PA note as documented above.   63 year old male seen with left great toe gangrene.  Has a history of left SFA popliteal stenting by Dr. Lenell Antu.  Duplex confirms the left SFA popliteal stents are occluded.  ABI on the left 0.44 monophasic.  Scheduled for aortogram lower extremity arteriogram with possible invention tomorrow with Dr. Myra Gianotti.  Please keep n.p.o. for midnight.  Discussed if no endovascular options may require bypass.  Cephus Shelling, MD [facility] [place] Office: [phone]

## 2023-04-17 NOTE — Progress Notes (Addendum)
  Progress Note    04/17/2023 10:36 AM * No surgery found *  Subjective:  no complaints   Vitals:   04/17/23 0451 04/17/23 0849  BP: (!) 155/86 131/82  Pulse: 80 90  Resp:    Temp: 98.1 F (36.7 C) 97.8 F (36.6 C)  SpO2: 96% 94%   Physical Exam: Cardiac:  regular Lungs:  non labored Extremities:  No palpable pedal pulses in left foot. Gangrene of left great toe, ischemic changes to left 2nd toe Neurologic: alert and oriented  CBC    Component Value Date/Time   WBC 11.6 (H) 04/17/2023 0610   RBC 4.99 04/17/2023 0610   HGB 14.5 04/17/2023 0610   HCT 43.3 04/17/2023 0610   PLT 374 04/17/2023 0610   MCV 86.8 04/17/2023 0610   MCH 29.1 04/17/2023 0610   MCHC 33.5 04/17/2023 0610   RDW 13.2 04/17/2023 0610   LYMPHSABS 3.8 06/01/2021 0052   MONOABS 0.8 06/01/2021 0052   EOSABS 0.2 06/01/2021 0052   BASOSABS 0.1 06/01/2021 0052    BMET    Component Value Date/Time   NA 133 (L) 04/17/2023 0610   K 3.7 04/17/2023 0610   CL 101 04/17/2023 0610   CO2 25 04/17/2023 0610   GLUCOSE 180 (H) 04/17/2023 0610   BUN 7 (L) 04/17/2023 0610   CREATININE 0.84 04/17/2023 0610   CALCIUM 8.8 (L) 04/17/2023 0610   GFRNONAA >60 04/17/2023 0610   GFRAA >60 03/16/2016 0859    INR    Component Value Date/Time   INR 1.0 05/31/2021 1117     Intake/Output Summary (Last 24 hours) at 04/17/2023 1036 Last data filed at 04/17/2023 0745 Gross per 24 hour  Intake 393.33 ml  Output 1350 ml  Net -956.67 ml     Assessment/Plan:  63 y.o. male with left great toe gangrene and likely popliteal stent occlusion  No change in left great toe gangrene Non invasive vascular studies are still pending Scheduled for Angiogram tomorrow in the cath lab with Dr. Myra Gianotti Keep NPO after midnight Consent order placed  Corrina Edgewater, PA-C [facility] [phone] 04/17/2023 10:36 AM  I have seen and evaluated the patient. I agree with the PA note as documented above.   63 year old male seen with left great toe gangrene.  Has a history of left SFA popliteal stenting by Dr. Lenell Antu.  Duplex confirms the left SFA popliteal stents are occluded.  ABI on the left 0.44 monophasic.  Scheduled for aortogram lower extremity arteriogram with possible invention tomorrow with Dr. Myra Gianotti.  Please keep n.p.o. for midnight.  Discussed if no endovascular options may require bypass.  Cephus Shelling, MD [facility] [place] Office: [phone]

## 2023-04-17 NOTE — Progress Notes (Signed)
Progress Note   Patient: Michael Chambers IEP:[id number] DOB: 01-Mar-1961 DOA: 04/16/2023     1 DOS: the patient was seen and examined on 04/17/2023   Brief hospital course: Michael Chambers is a 63 y.o. male with medical history significant of hypertension, atrial flutter, carotid artery disease status post left endarterectomy, PAD status post Pham pop stenting in 2023, CAD, CRAO, diabetes, cocaine use presenting from [facility] with left great toe gangrene.   Assessment and Plan: Left great toe gangrene Diabetic foot infection Awaiting MRI left foot rule out osteomyelitis. Podiatry and vascular surgery consulted. He possibly will end up with left hallux amputation pending MRI findings. Vascular evaluation appreciated. Vascular studies, ABI pending. Continue vancomycin, cefepime, Flagyl. Continue pain control.   Hypertension Continue home lisinopril  Monitor blood pressure closely.   Atrial flutter Heart rate stable. Not on rate/rhythm/nor anticoagulation meds   Carotid artery disease Status post left carotid endarterectomy Continue aspirin and Plavix Continue statin   PAD Status post Left femoral-popliteal angioplasty and stent placement in 2023 Will follow vascular surgery recommendations. Continue aspirin and Plavix, statin   CAD Continue home aspirin and Plavix Continue statin   Type II diabetes Blood sugars controlled. Continue Accu-Cheks, sliding scale insulin.  Obesity class II: BMI 35.5 Diet, exercise and weight reduction advised.   Out of bed to chair. Incentive spirometry. Nursing supportive care. Fall, aspiration precautions. DVT prophylaxis   Code Status: Full Code  Subjective: Patient is seen and examined today morning. Sitting on edge of bed, has pain over left foot. He wishes to eat, went for vascular procedure. Awaiting MRI foot.   Physical Exam: Vitals:   04/16/23 1841 04/16/23 1951 04/17/23 0451 04/17/23 0849  BP:  (!) 165/98 (!) 155/86  131/82  Pulse:  (!) 102 80 90  Resp:  18    Temp:  97.9 F (36.6 C) 98.1 F (36.7 C) 97.8 F (36.6 C)  TempSrc: Oral Oral    SpO2:   96% 94%  Weight:      Height:        General - Middle aged obese Caucasian male, no apparent distress HEENT - PERRLA, EOMI, atraumatic head, non tender sinuses. Lung - Clear, basal rales, no rhonchi, wheezes. Heart - S1, S2 heard, no murmurs, rubs, 1+ pedal edema. Abdomen - Soft, non tender, bowel sounds good Neuro - Alert, awake and oriented x 3, non focal exam. Skin - Warm and dry. Decreased pulses left lower extremities.  Data Reviewed:      Latest Ref Rng & Units 04/17/2023    6:10 AM 08/14/2021    2:31 AM 08/13/2021    4:25 PM  CBC  WBC 4.0 - 10.5 K/uL 11.6  15.9  11.1   Hemoglobin 13.0 - 17.0 g/dL 66.0  63.0  16.0   Hematocrit 39.0 - 52.0 % 43.3  40.5  47.0   Platelets 150 - 400 K/uL 374  188  256       Latest Ref Rng & Units 04/17/2023    6:10 AM 08/14/2021    2:31 AM 08/13/2021   10:03 AM  BMP  Glucose 70 - 99 mg/dL 109  323  557   BUN 8 - 23 mg/dL 7  18  14    Creatinine 0.61 - 1.24 mg/dL 3.22  0.25  4.27   Sodium 135 - 145 mmol/L 133  138  142   Potassium 3.5 - 5.1 mmol/L 3.7  4.2  4.1   Chloride 98 - 111 mmol/L 101  111  110   CO2 22 - 32 mmol/L 25  20    Calcium 8.9 - 10.3 mg/dL 8.8  8.6     VAS [place] LOWER EXTREMITY ARTERIAL DUPLEX Result Date: 04/17/2023 LOWER EXTREMITY ARTERIAL DUPLEX STUDY Patient Name:  Michael Chambers  Date of Exam:   04/17/2023 Medical Rec #: [id number]       Accession #:    [id number] Date of Birth: 1960-10-02      Patient Gender: M Patient Age:   89 years Exam Location:  [facility] Procedure:      VAS [place] LOWER EXTREMITY ARTERIAL DUPLEX Referring Phys: Michael Chambers --------------------------------------------------------------------------------  Indications: Gangrene, and peripheral artery disease. High Risk Factors: Hypertension, Diabetes, current smoker, coronary artery                     disease. Other Factors: Atrial flutter, history of cocaine abuse.  Vascular Interventions: Left femoral-popliteal angio with SFA stent 08/13/21,                         Left CEA 06/04/21. Current ABI:            R:0.81/0.61 L:0.44/gangrene of great toe Comparison Study: Prior left LEA done 09/16/21 Performing Technologist: Michael Chambers RVS  Examination Guidelines: A complete evaluation includes B-mode imaging, spectral Doppler, color Doppler, and power Doppler as needed of all accessible portions of each vessel. Bilateral testing is considered an integral part of a complete examination. Limited examinations for reoccurring indications may be performed as noted.  +--------+--------+-----+--------+--------+--------+ RIGHT   PSV cm/sRatioStenosisWaveformComments +--------+--------+-----+--------+--------+--------+ PTA Prox14                                    +--------+--------+-----+--------+--------+--------+ PTA Mid 14                                    +--------+--------+-----+--------+--------+--------+  +----------+--------+-----+--------------+-------------------+-----------------+ LEFT      PSV cm/sRatioStenosis      Waveform           Comments          +----------+--------+-----+--------------+-------------------+-----------------+ CFA Prox  251          30-49%        biphasic                                                    stenosis                                           +----------+--------+-----+--------------+-------------------+-----------------+ CFA Mid   204          30-49%        monophasic                                                  stenosis                                           +----------+--------+-----+--------------+-------------------+-----------------+  POP Prox               occluded                                           +----------+--------+-----+--------------+-------------------+-----------------+ POP Distal              occluded                                           +----------+--------+-----+--------------+-------------------+-----------------+ ATA Mid   12                         dampened monophasic                  +----------+--------+-----+--------------+-------------------+-----------------+ ATA Distal13                         dampened monophasic                  +----------+--------+-----+--------------+-------------------+-----------------+ PTA Prox  14                         dampened monophasic                  +----------+--------+-----+--------------+-------------------+-----------------+ PTA Mid   14                         dampened monophasiccollaterals noted +----------+--------+-----+--------------+-------------------+-----------------+ PTA Distal15                         dampened monophasic                  +----------+--------+-----+--------------+-------------------+-----------------+  Left Stent(s): +---------------+-------+--------+-----------------------------------+---------+ SFA            PSV    StenosisWaveform                           Comments                 cm/s                                                        +---------------+-------+--------+-----------------------------------+---------+ Prox to Stent  52             biphasic                                     +---------------+-------+--------+-----------------------------------+---------+ Proximal Stent 104            spiked past the origin, then       shadowing                               occluded                                     +---------------+-------+--------+-----------------------------------+---------+  Mid Stent             occluded                                             +---------------+-------+--------+-----------------------------------+---------+ Distal Stent          occluded                                              +---------------+-------+--------+-----------------------------------+---------+ Distal to Stent       occluded                                             +---------------+-------+--------+-----------------------------------+---------+    Summary: Left: 30-49% stenosis noted in the common femoral artery. Stenosis is noted within the Occluded left SFA stent just past the origin stent.  See table(s) above for measurements and observations.    Preliminary    VAS [place] ABI WITH/WO TBI Result Date: 04/17/2023  LOWER EXTREMITY DOPPLER STUDY Patient Name:  Michael Chambers  Date of Exam:   04/17/2023 Medical Rec #: [id number]       Accession #:    [id number] Date of Birth: Feb 02, 1961      Patient Gender: M Patient Age:   33 years Exam Location:  [facility] Procedure:      VAS [place] ABI WITH/WO TBI Referring Phys: Michael Chambers --------------------------------------------------------------------------------  Indications: Gangrene. High Risk         Hypertension, Diabetes, current smoker, coronary artery Factors:          disease. Other Factors: Atrial flutter, history of cocaine abuse.  Vascular Interventions: Left femoral-popliteal angio with SFA stent 08/13/21,                         Left CEA 06/04/21. Limitations: Today's exam was limited due to Restricted right upper extremity,              tachycardia, gangrene of left great toe/bandages. Comparison Study: Prior ABI done 09/16/21 Performing Technologist: Michael Chambers RVS  Examination Guidelines: A complete evaluation includes at minimum, Doppler waveform signals and systolic blood pressure reading at the level of bilateral brachial, anterior tibial, and posterior tibial arteries, when vessel segments are accessible. Bilateral testing is considered an integral part of a complete examination. Photoelectric Plethysmograph (PPG) waveforms and toe systolic pressure readings are included as required and additional duplex testing as needed. Limited examinations  for reoccurring indications may be performed as noted.  ABI Findings: +---------+------------------+-----+-----------+----------+ Right    Rt Pressure (mmHg)IndexWaveform   Comment    +---------+------------------+-----+-----------+----------+ Brachial                                   Restricted +---------+------------------+-----+-----------+----------+ PTA      128               0.81 multiphasic           +---------+------------------+-----+-----------+----------+ DP       94  0.59 monophasic            +---------+------------------+-----+-----------+----------+ Great Toe96                0.61 Abnormal              +---------+------------------+-----+-----------+----------+ +---------+------------------+-----+-------------------+----------------+ Left     Lt Pressure (mmHg)IndexWaveform           Comment          +---------+------------------+-----+-------------------+----------------+ Brachial 158                    multiphasic                         +---------+------------------+-----+-------------------+----------------+ PTA      69                0.44 monophasic                          +---------+------------------+-----+-------------------+----------------+ DP       63                0.40 dampened monophasic                 +---------+------------------+-----+-------------------+----------------+ Great Toe                       Abnormal           3rd toe waveform +---------+------------------+-----+-------------------+----------------+ +-------+-----------+-------------------------+------------+------------+ ABI/TBIToday's ABIToday's TBI              Previous ABIPrevious TBI +-------+-----------+-------------------------+------------+------------+ Right  0.81       0.61                     0.69        0.43         +-------+-----------+-------------------------+------------+------------+ Left   0.44       abnormal 3rd  toe waveform1.0         0.76         +-------+-----------+-------------------------+------------+------------+  Right ABIs and TBIs appear increased compared to prior study on 09/16/21. Left ABIs and TBIs appear decreased compared to prior study on 09/16/21.  Summary: Right: Resting right ankle-brachial index indicates mild right lower extremity arterial disease. The right toe-brachial index is abnormal. Left: Resting left ankle-brachial index indicates severe left lower extremity arterial disease. *See table(s) above for measurements and observations.     Preliminary    Family Communication: Discussed with patient he understand and agree. All questions answereed.  Disposition: Status is: Inpatient Remains inpatient appropriate because: IV antibiotics, vascular work up  Planned Discharge Destination: Home with Home Health     Time spent: 38 minutes  Author: Marcelino Duster, MD 04/17/2023 12:31 PM Secure chat 7am to 7pm For on call review [email or web address].

## 2023-04-17 NOTE — Progress Notes (Signed)
Went to see patient for PICC insertion.  Explained risk and benefits of line and the procedure.  Patient states "I don't want that".  Attempted to further explain the need with his limited venous access issue but he said "I just want the same as what I have here not that thing you are talking about"  I told the patient that if I could find an appropriate vessel when I assessed him with ultrasound then I would put in another PIV.  New IV inserted, ultrasound used to assess vein health and path.  Dr. Clide Dales and bedside nurse [facility] notified of patient declining PICC line.
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
## 2023-04-17 NOTE — Progress Notes (Signed)
VASCULAR LAB    ABI has been performed.  See CV proc for preliminary results.   Salisa Broz, RVT 04/17/2023, 11:30 AM

## 2023-04-18 ENCOUNTER — Encounter (HOSPITAL_COMMUNITY)
Admission: AD | Disposition: A | Discharge status: Home Health | Payer: Self-pay | Source: Other Acute Inpatient Hospital | Attending: Internal Medicine

## 2023-04-18 ENCOUNTER — Encounter (HOSPITAL_COMMUNITY): Payer: Self-pay | Admitting: Surgery

## 2023-04-18 ENCOUNTER — Ambulatory Visit (HOSPITAL_COMMUNITY): Admission: RE | Admit: 2023-04-18 | Payer: Medicare HMO | Source: Ambulatory Visit | Admitting: Surgery

## 2023-04-18 DIAGNOSIS — E66812 Obesity, class 2: Secondary | ICD-10-CM | POA: Diagnosis not present

## 2023-04-18 DIAGNOSIS — F1410 Cocaine abuse, uncomplicated: Secondary | ICD-10-CM | POA: Diagnosis not present

## 2023-04-18 DIAGNOSIS — E11628 Type 2 diabetes mellitus with other skin complications: Secondary | ICD-10-CM | POA: Diagnosis not present

## 2023-04-18 DIAGNOSIS — I96 Gangrene, not elsewhere classified: Secondary | ICD-10-CM | POA: Diagnosis not present

## 2023-04-18 DIAGNOSIS — I10 Essential (primary) hypertension: Secondary | ICD-10-CM | POA: Diagnosis not present

## 2023-04-18 HISTORY — PX: ABDOMINAL AORTOGRAM W/LOWER EXTREMITY: CATH118223

## 2023-04-18 LAB — CBC
HCT: 42.3 % (ref 39.0–52.0)
Hemoglobin: 14 g/dL (ref 13.0–17.0)
MCH: 28.9 pg (ref 26.0–34.0)
MCHC: 33.1 g/dL (ref 30.0–36.0)
MCV: 87.4 fL (ref 80.0–100.0)
Platelets: 347 10*3/uL (ref 150–400)
RBC: 4.84 MIL/uL (ref 4.22–5.81)
RDW: 13.1 % (ref 11.5–15.5)
WBC: 13 10*3/uL — ABNORMAL HIGH (ref 4.0–10.5)
nRBC: 0 % (ref 0.0–0.2)

## 2023-04-18 LAB — BASIC METABOLIC PANEL
Anion gap: 9 (ref 5–15)
BUN: 9 mg/dL (ref 8–23)
CO2: 28 mmol/L (ref 22–32)
Calcium: 8.6 mg/dL — ABNORMAL LOW (ref 8.9–10.3)
Chloride: 99 mmol/L (ref 98–111)
Creatinine, Ser: 0.79 mg/dL (ref 0.61–1.24)
GFR, Estimated: >60 mL/min (ref >60)
Glucose, Bld: 184 mg/dL — ABNORMAL HIGH (ref 70–99)
Potassium: 4.1 mmol/L (ref 3.5–5.1)
Sodium: 136 mmol/L (ref 135–145)

## 2023-04-18 LAB — GLUCOSE, CAPILLARY
Glucose-Capillary: 137 mg/dL — ABNORMAL HIGH (ref 70–99)
Glucose-Capillary: 176 mg/dL — ABNORMAL HIGH (ref 70–99)
Glucose-Capillary: 308 mg/dL — ABNORMAL HIGH (ref 70–99)
Glucose-Capillary: 216 mg/dL — ABNORMAL HIGH (ref 70–99)

## 2023-04-18 SURGERY — ABDOMINAL AORTOGRAM W/LOWER EXTREMITY
Anesthesia: LOCAL

## 2023-04-18 MED ORDER — LABETALOL HCL 5 MG/ML IV SOLN
10.0000 mg | INTRAVENOUS | Status: DC | PRN
  Administered 2023-04-18: 10 mg via INTRAVENOUS

## 2023-04-18 MED ORDER — LIDOCAINE HCL (PF) 1 % IJ SOLN
INTRAMUSCULAR | Status: AC
  Filled 2023-04-18: qty 30

## 2023-04-18 MED ORDER — SODIUM CHLORIDE 0.9% FLUSH: 3.0000 mL | INTRAVENOUS | Status: DC | PRN

## 2023-04-18 MED ORDER — ONDANSETRON HCL 4 MG/2ML IJ SOLN: 4.0000 mg | Freq: Four times a day (QID) | INTRAMUSCULAR | Status: DC | PRN

## 2023-04-18 MED ORDER — HEPARIN (PORCINE) IN NACL 1000-0.9 UT/500ML-% IV SOLN
INTRAVENOUS | Status: DC | PRN
  Administered 2023-04-18 (×2): 500 mL

## 2023-04-18 MED ORDER — SODIUM CHLORIDE 0.9 % IV SOLN
250.0000 mL | INTRAVENOUS | Status: DC | PRN
Start: 2023-04-18 — End: 2023-04-19

## 2023-04-18 MED ORDER — ACETAMINOPHEN 325 MG PO TABS: 650.0000 mg | ORAL_TABLET | ORAL | Status: DC | PRN

## 2023-04-18 MED ORDER — IODIXANOL 320 MG/ML IV SOLN
INTRAVENOUS | Status: DC | PRN
  Administered 2023-04-18: 85 mL

## 2023-04-18 MED ORDER — LIDOCAINE HCL (PF) 1 % IJ SOLN
INTRAMUSCULAR | Status: DC | PRN
  Administered 2023-04-18: 15 mL

## 2023-04-18 MED ORDER — MIDAZOLAM HCL 2 MG/2ML IJ SOLN
INTRAMUSCULAR | Status: AC
  Filled 2023-04-18: qty 2

## 2023-04-18 MED ORDER — METRONIDAZOLE 500 MG PO TABS
500.0000 mg | ORAL_TABLET | Freq: Two times a day (BID) | ORAL | Status: DC
Start: 2023-04-18 — End: 2023-04-27
  Administered 2023-04-18 – 2023-04-27 (×17): 500 mg via ORAL
  Filled 2023-04-18 (×17): qty 1

## 2023-04-18 MED ORDER — SODIUM CHLORIDE 0.9 % IV SOLN: INTRAVENOUS | Status: AC | PRN

## 2023-04-18 MED ORDER — ONDANSETRON HCL 4 MG/2ML IJ SOLN
INTRAMUSCULAR | Status: AC
Start: 2023-04-18 — End: ?
  Filled 2023-04-18: qty 2

## 2023-04-18 MED ORDER — SODIUM CHLORIDE 0.9 % WEIGHT BASED INFUSION
1.0000 mL/kg/h | INTRAVENOUS | Status: AC
  Administered 2023-04-18: 1 mL/kg/h via INTRAVENOUS

## 2023-04-18 MED ORDER — ONDANSETRON HCL 4 MG/2ML IJ SOLN
INTRAMUSCULAR | Status: DC | PRN
  Administered 2023-04-18: 4 mg via INTRAVENOUS

## 2023-04-18 MED ORDER — LABETALOL HCL 5 MG/ML IV SOLN
INTRAVENOUS | Status: AC
  Filled 2023-04-18: qty 4

## 2023-04-18 MED ORDER — FENTANYL CITRATE (PF) 100 MCG/2ML IJ SOLN
INTRAMUSCULAR | Status: DC | PRN
  Administered 2023-04-18: 50 ug via INTRAVENOUS

## 2023-04-18 MED ORDER — FENTANYL CITRATE (PF) 100 MCG/2ML IJ SOLN
INTRAMUSCULAR | Status: AC
  Filled 2023-04-18: qty 2

## 2023-04-18 MED ORDER — MIDAZOLAM HCL 2 MG/2ML IJ SOLN
INTRAMUSCULAR | Status: DC | PRN
  Administered 2023-04-18: 2 mg via INTRAVENOUS

## 2023-04-18 MED ORDER — SODIUM CHLORIDE 0.9% FLUSH
3.0000 mL | Freq: Two times a day (BID) | INTRAVENOUS | Status: DC
  Administered 2023-04-19 – 2023-04-27 (×12): 3 mL via INTRAVENOUS

## 2023-04-18 MED ORDER — DIPHENHYDRAMINE HCL 50 MG/ML IJ SOLN
25.0000 mg | Freq: Once | INTRAMUSCULAR | Status: AC
  Administered 2023-04-18: 25 mg via INTRAVENOUS
  Filled 2023-04-18: qty 1

## 2023-04-18 MED ORDER — HYDROCODONE-ACETAMINOPHEN 5-325 MG PO TABS
ORAL_TABLET | ORAL | Status: AC
  Administered 2023-04-18: 2 via ORAL
  Filled 2023-04-18: qty 2

## 2023-04-18 MED ORDER — METHYLPREDNISOLONE SODIUM SUCC 125 MG IJ SOLR
125.0000 mg | Freq: Once | INTRAMUSCULAR | Status: AC
  Administered 2023-04-18: 125 mg via INTRAVENOUS
  Filled 2023-04-18: qty 2

## 2023-04-18 MED ORDER — INSULIN ASPART 100 UNIT/ML IJ SOLN
INTRAMUSCULAR | Status: AC
  Filled 2023-04-18: qty 1

## 2023-04-18 MED ORDER — HYDRALAZINE HCL 20 MG/ML IJ SOLN: 5.0000 mg | INTRAMUSCULAR | Status: DC | PRN

## 2023-04-18 SURGICAL SUPPLY — 9 items
CATH OMNI FLUSH 5F 65CM (CATHETERS) IMPLANT
CLOSURE MYNX CONTROL 5F (Vascular Products) IMPLANT
GUIDEWIRE ANGLED .035X150CM (WIRE) IMPLANT
KIT MICROPUNCTURE NIT STIFF (SHEATH) IMPLANT
SET ATX-X65L (MISCELLANEOUS) IMPLANT
SHEATH PINNACLE 5F 10CM (SHEATH) IMPLANT
SHEATH PROBE COVER 6X72 (BAG) IMPLANT
TRAY PV CATH (CUSTOM PROCEDURE TRAY) ×1 IMPLANT
WIRE BENTSON .035X145CM (WIRE) IMPLANT

## 2023-04-18 NOTE — Progress Notes (Signed)
Progress Note   Patient: Michael Chambers KVQ:[id number] DOB: [birth year] DOA: 04/16/2023     2 DOS: the patient was seen and examined on 04/18/2023   Brief hospital course: Michael Chambers is a 63 y.o. male with medical history significant of hypertension, atrial flutter, carotid artery disease status post left endarterectomy, PAD status post Pham pop stenting in 2023, CAD, CRAO, diabetes, cocaine use presenting from [facility] with left great toe gangrene.   Assessment and Plan: Left great toe gangrene Diabetic foot infection MRI left foot - Findings are suspicious for necrotizing soft tissue infection. No cortical destruction identified to suggest osteomyelitis. Podiatry and vascular surgery follow up appreciated. He possibly will end up with left hallux amputation. Vascular evaluation appreciated. He is awaiting bilateral lower extremity angio.  Continue vancomycin, cefepime, Flagyl. Continue pain control.   Hypertension Continue home lisinopril  Monitor blood pressure closely.   Atrial flutter Heart rate stable. Not on rate/rhythm/nor anticoagulation meds   Carotid artery disease Status post left carotid endarterectomy Continue aspirin and Plavix Continue statin   PAD Status post Left femoral-popliteal angioplasty and stent placement in 2023 Will follow vascular surgery recommendations. Continue aspirin and Plavix, statin   CAD Continue home aspirin and Plavix Continue statin   Type II diabetes Blood sugars controlled. Continue Accu-Cheks, sliding scale insulin.  Obesity class II: BMI 35.5 Diet, exercise and weight reduction advised.   Out of bed to chair. Incentive spirometry. Nursing supportive care. Fall, aspiration precautions. DVT prophylaxis   Code Status: Full Code  Subjective: Patient is seen and examined today morning. Awaiting vascular procedure. Pain better. No overnight issues.  Physical Exam: Vitals:   04/18/23 1115 04/18/23 1130 04/18/23  1320 04/18/23 1646  BP: (!) 155/88  (!) 147/85 (!) 156/93  Pulse: 79 75 89 93  Resp: 18 14 18 15   Temp:    97.8 F (36.6 C)  TempSrc:    Oral  SpO2: 96% 95% 92% 93%  Weight:      Height:        General - Middle aged obese Caucasian male, no apparent distress HEENT - PERRLA, EOMI, atraumatic head, non tender sinuses. Lung - Clear, basal rales, no rhonchi, wheezes. Heart - S1, S2 heard, no murmurs, rubs, 1+ pedal edema. Abdomen - Soft, non tender, bowel sounds good Neuro - Alert, awake and oriented x 3, non focal exam. Skin - Warm and dry. Decreased pulses left lower extremities.  Data Reviewed:      Latest Ref Rng & Units 04/18/2023    5:35 AM 04/17/2023    6:10 AM 08/14/2021    2:31 AM  CBC  WBC 4.0 - 10.5 K/uL 13.0  11.6  15.9   Hemoglobin 13.0 - 17.0 g/dL 64.3  32.9  51.8   Hematocrit 39.0 - 52.0 % 42.3  43.3  40.5   Platelets 150 - 400 K/uL 347  374  188       Latest Ref Rng & Units 04/18/2023    5:35 AM 04/17/2023    6:10 AM 08/14/2021    2:31 AM  BMP  Glucose 70 - 99 mg/dL 841  660  630   BUN 8 - 23 mg/dL 9  7  18    Creatinine 0.61 - 1.24 mg/dL 1.60  1.09  3.23   Sodium 135 - 145 mmol/L 136  133  138   Potassium 3.5 - 5.1 mmol/L 4.1  3.7  4.2   Chloride 98 - 111 mmol/L 99  101  111  CO2 22 - 32 mmol/L 28  25  20    Calcium 8.9 - 10.3 mg/dL 8.6  8.8  8.6    PERIPHERAL VASCULAR CATHETERIZATION Result Date: 04/18/2023 Images from the original result were not included. [name] MRN: [id number] DOB: Sep 29, 1960 Sex: male 04/18/2023 Pre-operative Diagnosis: Left foot gangrene Post-operative diagnosis:  Same Surgeon:  Durene Cal Procedure Performed:  1.  Ultrasound-guided access, right femoral artery  2.  Abdominal aortogram  3.  Catheter selection into the left external iliac artery (second-order)  4.  Bilateral lower extremity angiogram  5.  Conscious sedation, 15 minutes  6.  Closure device, Mynx Indications: This is a 63 year old gentleman with wounds  to his left foot.  He has previously undergone stenting of the left superficial femoral and popliteal artery which were occluded on ultrasound.  He comes in today for further evaluation. Procedure:  The patient was identified in the holding area and taken to room 8.  The patient was then placed supine on the table and prepped and draped in the usual sterile fashion.  A time out was called.  Conscious sedation was administered with the use of IV fentanyl and Versed under continuous physician and nurse monitoring.  Heart rate, blood pressure, and oxygen saturation were continuously monitored.  Total sedation time was 15 minutes.  Ultrasound was used to evaluate the right common femoral artery.  It was patent .  A digital ultrasound image was acquired.  A micropuncture needle was used to access the right common femoral artery under ultrasound guidance.  An 018 wire was advanced without resistance and a micropuncture sheath was placed.  The 018 wire was removed and a benson wire was placed.  The micropuncture sheath was exchanged for a 5 french sheath.  An omniflush catheter was advanced over the wire to the level of L-1.  An abdominal angiogram was obtained.  Next, using the omniflush catheter and a benson wire, the aortic bifurcation was crossed and the catheter was placed into theleft external iliac artery and left runoff was obtained.  right runoff was performed via retrograde sheath injections. Findings:  Aortogram: No significant renal artery stenosis was visualized.  The infrarenal abdominal aorta is widely patent.  Bilateral common and external iliac arteries are widely patent.  Right Lower Extremity: The right common femoral and profundofemoral artery are widely patent.  The superficial femoral artery has a short segment occlusion in the adductor canal.  Popliteal artery reconstitutes with two-vessel runoff via the posterior tibial anterior tibial artery  Left Lower Extremity: Left common femoral and  profundofemoral artery are widely patent.  The superficial femoral artery is patent proximally however occludes within the previously placed stents.  There is reconstitution of the distal below-knee popliteal artery with three-vessel runoff Intervention: None.  The groin was closed with a Mynx Impression:  #1  Bilateral superficial femoral artery occlusion.  #2  Patient will be scheduled for a left femoral to below-knee popliteal artery bypass graft tomorrow. Juleen China, M.D., [facility] [place] Office: [phone] Pager:  [phone]   MR FOOT LEFT W WO CONTRAST Result Date: 04/18/2023 CLINICAL DATA:  Soft tissue infection suspected. EXAM: MRI OF THE LEFT FOREFOOT WITHOUT AND WITH CONTRAST TECHNIQUE: Multiplanar, multisequence MR imaging of the left forefoot was performed both before and after administration of intravenous contrast. CONTRAST:  10mL GADAVIST GADOBUTROL 1 MMOL/ML IV SOLN COMPARISON:  Radiographs 04/15/2023.  MRI 06/01/2021. FINDINGS: Technical note: Despite efforts by the technologist and  patient, mild motion artifact is present on today's exam and could not be eliminated. This reduces exam sensitivity and specificity. Bones/Joint/Cartilage Bone detail limited by motion. In correlation with the recent radiographs, there is soft tissue emphysema within the great toe. There is suspected intraosseous extension of gas into the distal phalanx, best seen on the coronal images. No cortical destruction identified. There is no suspicious marrow T2 signal or enhancement. The distal phalanx of the 2nd toe appears absent, unchanged from recent radiographs and previous MRI. The head of the 5th proximal phalanx appears chronically eroded. There are mild degenerative changes at the 1st metatarsophalangeal joint. No significant joint effusions or suspicious synovial enhancement. Ligaments Intact Lisfranc ligament. The collateral ligaments of the metatarsophalangeal joints  appear intact. Muscles and Tendons No evidence of acute tendon tear or significant tenosynovitis. Chronic muscular atrophy and T2 hyperintensity attributed to underlying diabetes. Soft tissues Soft tissue ulceration along the dorsal and medial aspects of the great toe with underlying soft tissue swelling and soft tissue emphysema. No focal fluid collections identified. Soft tissue enhancement extends proximally to the level of the metatarsophalangeal joint. IMPRESSION: 1. Soft tissue ulceration along the dorsal and medial aspects of the great toe with underlying soft tissue swelling and soft tissue emphysema. Findings are suspicious for necrotizing soft tissue infection. 2. Suspected intraosseous extension of gas into the distal phalanx, best seen on the coronal images. No cortical destruction identified to suggest osteomyelitis. 3. No focal fluid collections identified. 4. Chronic muscular atrophy and T2 hyperintensity attributed to underlying diabetes. Electronically Signed   By: Carey Bullocks M.D.   On: 04/18/2023 08:13   [place] EKG SITE RITE Result Date: 04/17/2023 If Site Rite image not attached, placement could not be confirmed due to current cardiac rhythm.  VAS [place] ABI WITH/WO TBI Result Date: 04/17/2023  LOWER EXTREMITY DOPPLER STUDY Patient Name:  Michael Chambers  Date of Exam:   04/17/2023 Medical Rec #: [id number]       Accession #:    [id number] Date of Birth: 07/07/1960      Patient Gender: M Patient Age:   57 years Exam [facility]  [facility] Procedure:      VAS [place] ABI WITH/WO TBI Referring Phys: Sherald Hess --------------------------------------------------------------------------------  Indications: Gangrene. High Risk         Hypertension, Diabetes, current smoker, coronary artery Factors:          disease. Other Factors: Atrial flutter, history of cocaine abuse.  Vascular Interventions: Left femoral-popliteal angio with SFA stent 08/13/21,                         Left CEA 06/04/21.  Limitations: Today's exam was limited due to Restricted right upper extremity,              tachycardia, gangrene of left great toe/bandages. Comparison Study: Prior ABI done 09/16/21 Performing Technologist: Sherren Kerns RVS  Examination Guidelines: A complete evaluation includes at minimum, Doppler waveform signals and systolic blood pressure reading at the level of bilateral brachial, anterior tibial, and posterior tibial arteries, when vessel segments are accessible. Bilateral testing is considered an integral part of a complete examination. Photoelectric Plethysmograph (PPG) waveforms and toe systolic pressure readings are included as required and additional duplex testing as needed. Limited examinations for reoccurring indications may be performed as noted.  ABI Findings: +---------+------------------+-----+-----------+----------+ Right    Rt Pressure (mmHg)IndexWaveform   Comment    +---------+------------------+-----+-----------+----------+ Brachial  Restricted +---------+------------------+-----+-----------+----------+ PTA      128               0.81 multiphasic           +---------+------------------+-----+-----------+----------+ DP       94                0.59 monophasic            +---------+------------------+-----+-----------+----------+ Great Toe96                0.61 Abnormal              +---------+------------------+-----+-----------+----------+ +---------+------------------+-----+-------------------+----------------+ Left     Lt Pressure (mmHg)IndexWaveform           Comment          +---------+------------------+-----+-------------------+----------------+ Brachial 158                    multiphasic                         +---------+------------------+-----+-------------------+----------------+ PTA      69                0.44 monophasic                           +---------+------------------+-----+-------------------+----------------+ DP       63                0.40 dampened monophasic                 +---------+------------------+-----+-------------------+----------------+ Great Toe                       Abnormal           3rd toe waveform +---------+------------------+-----+-------------------+----------------+ +-------+-----------+-------------------------+------------+------------+ ABI/TBIToday's ABIToday's TBI              Previous ABIPrevious TBI +-------+-----------+-------------------------+------------+------------+ Right  0.81       0.61                     0.69        0.43         +-------+-----------+-------------------------+------------+------------+ Left   0.44       abnormal 3rd toe waveform1.0         0.76         +-------+-----------+-------------------------+------------+------------+ Right ABIs and TBIs appear increased compared to prior study on 09/16/21. Left ABIs and TBIs appear decreased compared to prior study on 09/16/21.  Summary: Right: Resting right ankle-brachial index indicates mild right lower extremity arterial disease. The right toe-brachial index is abnormal. Left: Resting left ankle-brachial index indicates severe left lower extremity arterial disease. *See table(s) above for measurements and observations.  Electronically signed by Carolynn Sayers on 04/17/2023 at 2:07:17 PM.    Final    VAS [place] LOWER EXTREMITY ARTERIAL DUPLEX Result Date: 04/17/2023 LOWER EXTREMITY ARTERIAL DUPLEX STUDY Patient Name:  Michael Chambers  Date of Exam:   04/17/2023 Medical Rec #: [id number]       Accession #:    [id number] Date of Birth: 06-17-1960      Patient Gender: M Patient Age:   25 years Exam Location:  [facility] Procedure:      VAS [place] LOWER EXTREMITY ARTERIAL DUPLEX Referring Phys: Sherald Hess --------------------------------------------------------------------------------  Indications: Gangrene, and peripheral  artery disease. High Risk Factors: Hypertension, Diabetes, current smoker, coronary artery  disease. Other Factors: Atrial flutter, history of cocaine abuse.  Vascular Interventions: Left femoral-popliteal angio with SFA stent 08/13/21,                         Left CEA 06/04/21. Current ABI:            R:0.81/0.61 L:0.44/gangrene of great toe Comparison Study: Prior left LEA done 09/16/21 Performing Technologist: Sherren Kerns RVS  Examination Guidelines: A complete evaluation includes B-mode imaging, spectral Doppler, color Doppler, and power Doppler as needed of all accessible portions of each vessel. Bilateral testing is considered an integral part of a complete examination. Limited examinations for reoccurring indications may be performed as noted.  +--------+--------+-----+--------+--------+--------+ RIGHT   PSV cm/sRatioStenosisWaveformComments +--------+--------+-----+--------+--------+--------+ PTA Prox14                                    +--------+--------+-----+--------+--------+--------+ PTA Mid 14                                    +--------+--------+-----+--------+--------+--------+  +----------+--------+-----+--------------+-------------------+-----------------+ LEFT      PSV cm/sRatioStenosis      Waveform           Comments          +----------+--------+-----+--------------+-------------------+-----------------+ CFA Prox  251          30-49%        biphasic                                                    stenosis                                           +----------+--------+-----+--------------+-------------------+-----------------+ CFA Mid   204          30-49%        monophasic                                                  stenosis                                           +----------+--------+-----+--------------+-------------------+-----------------+ POP Prox               occluded                                            +----------+--------+-----+--------------+-------------------+-----------------+ POP Distal             occluded                                           +----------+--------+-----+--------------+-------------------+-----------------+ ATA Mid  12                         dampened monophasic                  +----------+--------+-----+--------------+-------------------+-----------------+ ATA Distal13                         dampened monophasic                  +----------+--------+-----+--------------+-------------------+-----------------+ PTA Prox  14                         dampened monophasic                  +----------+--------+-----+--------------+-------------------+-----------------+ PTA Mid   14                         dampened monophasiccollaterals noted +----------+--------+-----+--------------+-------------------+-----------------+ PTA Distal15                         dampened monophasic                  +----------+--------+-----+--------------+-------------------+-----------------+  Left Stent(s): +---------------+-------+--------+-----------------------------------+---------+ SFA            PSV    StenosisWaveform                           Comments                 cm/s                                                        +---------------+-------+--------+-----------------------------------+---------+ Prox to Stent  52             biphasic                                     +---------------+-------+--------+-----------------------------------+---------+ Proximal Stent 104            spiked past the origin, then       shadowing                               occluded                                     +---------------+-------+--------+-----------------------------------+---------+ Mid Stent             occluded                                              +---------------+-------+--------+-----------------------------------+---------+ Distal Stent          occluded                                             +---------------+-------+--------+-----------------------------------+---------+  Distal to Stent       occluded                                             +---------------+-------+--------+-----------------------------------+---------+    Summary: Left: 30-49% stenosis noted in the common femoral artery. Stenosis is noted within the Occluded left SFA stent just past the origin stent.  See table(s) above for measurements and observations. Electronically signed by Carolynn Sayers on 04/17/2023 at 2:06:40 PM.    Final    Family Communication: Discussed with patient he understand and agree. All questions answereed.  Disposition: Status is: Inpatient Remains inpatient appropriate because: IV antibiotics, below-knee popliteal artery bypass graft tomorrow.   Planned Discharge Destination: Home with Home Health     Time spent: 37 minutes  Author: Marcelino Duster, MD 04/18/2023 6:27 PM Secure chat 7am to 7pm For on call review [email or web address].

## 2023-04-18 NOTE — Interval H&P Note (Signed)
History and Physical Interval Note:  04/18/2023 9:02 AM  Michael Chambers  has presented today for surgery, with the diagnosis of grangren left great toe.  The various methods of treatment have been discussed with the patient and family. After consideration of risks, benefits and other options for treatment, the patient has consented to  Procedure(s): ABDOMINAL AORTOGRAM W/LOWER EXTREMITY (N/A) as a surgical intervention.  The patient's history has been reviewed, patient examined, no change in status, stable for surgery.  I have reviewed the patient's chart and labs.  Questions were answered to the patient's satisfaction.     Durene Cal

## 2023-04-18 NOTE — Op Note (Signed)
    [name] MRN: [id number] DOB: January 25, 1961 Sex: male  04/18/2023 Pre-operative Diagnosis: Left foot gangrene Post-operative diagnosis:  Same Surgeon:  Durene Cal Procedure Performed:  1.  Ultrasound-guided access, right femoral artery  2.  Abdominal aortogram  3.  Catheter selection into the left external iliac artery (second-order)  4.  Bilateral lower extremity angiogram  5.  Conscious sedation, 15 minutes  6.  Closure device, Mynx    Indications: This is a 63 year old gentleman with wounds to his left foot.  He has previously undergone stenting of the left superficial femoral and popliteal artery which were occluded on ultrasound.  He comes in today for further evaluation.  Procedure:  The patient was identified in the holding area and taken to room 8.  The patient was then placed supine on the table and prepped and draped in the usual sterile fashion.  A time out was called.  Conscious sedation was administered with the use of IV fentanyl and Versed under continuous physician and nurse monitoring.  Heart rate, blood pressure, and oxygen saturation were continuously monitored.  Total sedation time was 15 minutes.  Ultrasound was used to evaluate the right common femoral artery.  It was patent .  A digital ultrasound image was acquired.  A micropuncture needle was used to access the right common femoral artery under ultrasound guidance.  An 018 wire was advanced without resistance and a micropuncture sheath was placed.  The 018 wire was removed and a benson wire was placed.  The micropuncture sheath was exchanged for a 5 french sheath.  An omniflush catheter was advanced over the wire to the level of L-1.  An abdominal angiogram was obtained.  Next, using the omniflush catheter and a benson wire, the aortic bifurcation was crossed and the catheter was placed into theleft external iliac artery and left runoff was obtained.  right runoff was performed via retrograde sheath  injections.  Findings:   Aortogram: No significant renal artery stenosis was visualized.  The infrarenal abdominal aorta is widely patent.  Bilateral common and external iliac arteries are widely patent.  Right Lower Extremity: The right common femoral and profundofemoral artery are widely patent.  The superficial femoral artery has a short segment occlusion in the adductor canal.  Popliteal artery reconstitutes with two-vessel runoff via the posterior tibial anterior tibial artery  Left Lower Extremity: Left common femoral and profundofemoral artery are widely patent.  The superficial femoral artery is patent proximally however occludes within the previously placed stents.  There is reconstitution of the distal below-knee popliteal artery with three-vessel runoff  Intervention: None.  The groin was closed with a Mynx  Impression:  #1  Bilateral superficial femoral artery occlusion.  #2  Patient will be scheduled for a left femoral to below-knee popliteal artery bypass graft tomorrow.    Juleen China, M.D., [facility] [place] Office: [phone] Pager:  [phone]

## 2023-04-18 NOTE — Progress Notes (Signed)
-  Status post aortogram with runoff -Patient's left superficial femoral and popliteal artery stents are occluded. I discussed proceeding with a left femoral to below-knee popliteal artery bypass graft tomorrow.  I looked at his saphenous vein in the Cath Lab and he appears to have adequate vein.  We discussed the details of the procedure.  All questions were answered.  I have stopped his Plavix.  He is at a slightly higher risk for bleeding, but I feel that the risks outweigh the benefits given the appearance of his foot.  He will be n.p.o. after midnight.  Durene Cal

## 2023-04-18 NOTE — Progress Notes (Addendum)
Mobility Specialist Progress Note:    04/18/23 1443  Mobility  Activity Dangled on edge of bed  Level of Assistance Standby assist, set-up cues, supervision of patient - no hands on  Activity Response Tolerated well  Mobility Referral Yes  Mobility visit 1 Mobility  Mobility Specialist Start Time (ACUTE ONLY) 1440  Mobility Specialist Stop Time (ACUTE ONLY) 1443  Mobility Specialist Time Calculation (min) (ACUTE ONLY) 3 min   Pt requesting to sit up at EOB to eat lunch. Politely declined sitting up in chair and ambulating. Shortly after, pt called for assistance with bed alarm. Requesting to stand in order to use urinal. Tolerated well, left with all needs met.   Feliciana Rossetti Mobility Specialist Please contact via Special educational needs teacher or  Rehab office at [phone]

## 2023-04-18 NOTE — H&P (View-Only) (Signed)
-  Status post aortogram with runoff -Patient's left superficial femoral and popliteal artery stents are occluded. I discussed proceeding with a left femoral to below-knee popliteal artery bypass graft tomorrow.  I looked at his saphenous vein in the Cath Lab and he appears to have adequate vein.  We discussed the details of the procedure.  All questions were answered.  I have stopped his Plavix.  He is at a slightly higher risk for bleeding, but I feel that the risks outweigh the benefits given the appearance of his foot.  He will be n.p.o. after midnight.  Durene Cal

## 2023-04-18 NOTE — Progress Notes (Addendum)
Report called to [facility] RN on 4e. Was asked to call back to give report to RN who would will take it. Or call back in ten minutes

## 2023-04-19 ENCOUNTER — Inpatient Hospital Stay (HOSPITAL_COMMUNITY): Payer: Medicare HMO | Admitting: Anesthesiology

## 2023-04-19 ENCOUNTER — Encounter (HOSPITAL_COMMUNITY): Payer: Self-pay | Admitting: Internal Medicine

## 2023-04-19 ENCOUNTER — Other Ambulatory Visit: Payer: Self-pay

## 2023-04-19 ENCOUNTER — Inpatient Hospital Stay (HOSPITAL_COMMUNITY): Payer: Self-pay | Admitting: Anesthesiology

## 2023-04-19 ENCOUNTER — Encounter (HOSPITAL_COMMUNITY)
Admission: AD | Disposition: A | Discharge status: Home Health | Payer: Self-pay | Source: Other Acute Inpatient Hospital | Attending: Internal Medicine

## 2023-04-19 DIAGNOSIS — I10 Essential (primary) hypertension: Secondary | ICD-10-CM

## 2023-04-19 DIAGNOSIS — F17210 Nicotine dependence, cigarettes, uncomplicated: Secondary | ICD-10-CM | POA: Diagnosis not present

## 2023-04-19 DIAGNOSIS — F1410 Cocaine abuse, uncomplicated: Secondary | ICD-10-CM | POA: Diagnosis not present

## 2023-04-19 DIAGNOSIS — I96 Gangrene, not elsewhere classified: Secondary | ICD-10-CM | POA: Diagnosis not present

## 2023-04-19 DIAGNOSIS — E1152 Type 2 diabetes mellitus with diabetic peripheral angiopathy with gangrene: Secondary | ICD-10-CM | POA: Diagnosis not present

## 2023-04-19 DIAGNOSIS — I2510 Atherosclerotic heart disease of native coronary artery without angina pectoris: Secondary | ICD-10-CM | POA: Diagnosis not present

## 2023-04-19 DIAGNOSIS — E66812 Obesity, class 2: Secondary | ICD-10-CM | POA: Diagnosis not present

## 2023-04-19 DIAGNOSIS — F17200 Nicotine dependence, unspecified, uncomplicated: Secondary | ICD-10-CM

## 2023-04-19 DIAGNOSIS — E11628 Type 2 diabetes mellitus with other skin complications: Secondary | ICD-10-CM | POA: Diagnosis not present

## 2023-04-19 HISTORY — PX: FEMORAL-POPLITEAL BYPASS GRAFT: SHX937

## 2023-04-19 LAB — DIFFERENTIAL
Abs Immature Granulocytes: 0.11 10*3/uL — ABNORMAL HIGH (ref 0.00–0.07)
Basophils Absolute: 0.1 10*3/uL (ref 0.0–0.1)
Basophils Relative: 0 %
Eosinophils Absolute: 0.1 10*3/uL (ref 0.0–0.5)
Eosinophils Relative: 0 %
Immature Granulocytes: 1 %
Lymphocytes Relative: 20 %
Lymphs Abs: 3.2 10*3/uL (ref 0.7–4.0)
Monocytes Absolute: 1.3 10*3/uL — ABNORMAL HIGH (ref 0.1–1.0)
Monocytes Relative: 8 %
Neutro Abs: 11.1 10*3/uL — ABNORMAL HIGH (ref 1.7–7.7)
Neutrophils Relative %: 71 %

## 2023-04-19 LAB — COMPREHENSIVE METABOLIC PANEL
ALT: 23 U/L (ref 0–44)
AST: 18 U/L (ref 15–41)
Albumin: 3 g/dL — ABNORMAL LOW (ref 3.5–5.0)
Alkaline Phosphatase: 70 U/L (ref 38–126)
Anion gap: 12 (ref 5–15)
BUN: 14 mg/dL (ref 8–23)
CO2: 22 mmol/L (ref 22–32)
Calcium: 9.1 mg/dL (ref 8.9–10.3)
Chloride: 101 mmol/L (ref 98–111)
Creatinine, Ser: 0.89 mg/dL (ref 0.61–1.24)
GFR, Estimated: >60 mL/min (ref >60)
Glucose, Bld: 171 mg/dL — ABNORMAL HIGH (ref 70–99)
Potassium: 4.4 mmol/L (ref 3.5–5.1)
Sodium: 135 mmol/L (ref 135–145)
Total Bilirubin: 0.4 mg/dL (ref 0.0–1.2)
Total Protein: 6 g/dL — ABNORMAL LOW (ref 6.5–8.1)

## 2023-04-19 LAB — CBC WITH DIFFERENTIAL/PLATELET
Abs Immature Granulocytes: 0.1 10*3/uL — ABNORMAL HIGH (ref 0.00–0.07)
Basophils Absolute: 0.1 10*3/uL (ref 0.0–0.1)
Basophils Relative: 0 %
Eosinophils Absolute: 0.1 10*3/uL (ref 0.0–0.5)
Eosinophils Relative: 0 %
HCT: 36.2 % — ABNORMAL LOW (ref 39.0–52.0)
Hemoglobin: 11.7 g/dL — ABNORMAL LOW (ref 13.0–17.0)
Immature Granulocytes: 1 %
Lymphocytes Relative: 20 %
Lymphs Abs: 2.9 10*3/uL (ref 0.7–4.0)
MCH: 29.4 pg (ref 26.0–34.0)
MCHC: 32.3 g/dL (ref 30.0–36.0)
MCV: 91 fL (ref 80.0–100.0)
Monocytes Absolute: 1.1 10*3/uL — ABNORMAL HIGH (ref 0.1–1.0)
Monocytes Relative: 8 %
Neutro Abs: 10.7 10*3/uL — ABNORMAL HIGH (ref 1.7–7.7)
Neutrophils Relative %: 71 %
Platelets: 352 10*3/uL (ref 150–400)
RBC: 3.98 MIL/uL — ABNORMAL LOW (ref 4.22–5.81)
RDW: 13.3 % (ref 11.5–15.5)
WBC: 15 10*3/uL — ABNORMAL HIGH (ref 4.0–10.5)
nRBC: 0 % (ref 0.0–0.2)

## 2023-04-19 LAB — LIPID PANEL
Cholesterol: 102 mg/dL (ref 0–200)
HDL: 31 mg/dL — ABNORMAL LOW (ref >40)
LDL Cholesterol: 56 mg/dL (ref 0–99)
Total CHOL/HDL Ratio: 3.3 {ratio}
Triglycerides: 76 mg/dL (ref <150)
VLDL: 15 mg/dL (ref 0–40)

## 2023-04-19 LAB — TYPE AND SCREEN
ABO/RH(D): B POS
Antibody Screen: NEGATIVE

## 2023-04-19 LAB — CBC
HCT: 36.6 % — ABNORMAL LOW (ref 39.0–52.0)
HCT: 36 % — ABNORMAL LOW (ref 39.0–52.0)
Hemoglobin: 11.7 g/dL — ABNORMAL LOW (ref 13.0–17.0)
Hemoglobin: 11.6 g/dL — ABNORMAL LOW (ref 13.0–17.0)
MCH: 29 pg (ref 26.0–34.0)
MCH: 28.9 pg (ref 26.0–34.0)
MCHC: 32 g/dL (ref 30.0–36.0)
MCHC: 32.2 g/dL (ref 30.0–36.0)
MCV: 90.6 fL (ref 80.0–100.0)
MCV: 89.6 fL (ref 80.0–100.0)
Platelets: 349 10*3/uL (ref 150–400)
Platelets: 315 10*3/uL (ref 150–400)
RBC: 4.04 MIL/uL — ABNORMAL LOW (ref 4.22–5.81)
RBC: 4.02 MIL/uL — ABNORMAL LOW (ref 4.22–5.81)
RDW: 13.3 % (ref 11.5–15.5)
RDW: 13.3 % (ref 11.5–15.5)
WBC: 15.8 10*3/uL — ABNORMAL HIGH (ref 4.0–10.5)
WBC: 16.1 10*3/uL — ABNORMAL HIGH (ref 4.0–10.5)
nRBC: 0 % (ref 0.0–0.2)
nRBC: 0 % (ref 0.0–0.2)

## 2023-04-19 LAB — SURGICAL PCR SCREEN
MRSA, PCR: NEGATIVE
Staphylococcus aureus: NEGATIVE

## 2023-04-19 LAB — CREATININE, SERUM
Creatinine, Ser: 0.95 mg/dL (ref 0.61–1.24)
GFR, Estimated: >60 mL/min (ref >60)

## 2023-04-19 LAB — GLUCOSE, CAPILLARY
Glucose-Capillary: 163 mg/dL — ABNORMAL HIGH (ref 70–99)
Glucose-Capillary: 179 mg/dL — ABNORMAL HIGH (ref 70–99)
Glucose-Capillary: 139 mg/dL — ABNORMAL HIGH (ref 70–99)
Glucose-Capillary: 133 mg/dL — ABNORMAL HIGH (ref 70–99)
Glucose-Capillary: 204 mg/dL — ABNORMAL HIGH (ref 70–99)

## 2023-04-19 LAB — PHOSPHORUS: Phosphorus: 3.2 mg/dL (ref 2.5–4.6)

## 2023-04-19 LAB — MAGNESIUM: Magnesium: 2.1 mg/dL (ref 1.7–2.4)

## 2023-04-19 SURGERY — BYPASS GRAFT FEMORAL-POPLITEAL ARTERY
Anesthesia: General | Site: Leg Lower | Laterality: Left

## 2023-04-19 MED ORDER — MAGNESIUM SULFATE 2 GM/50ML IV SOLN: 2.0000 g | Freq: Every day | INTRAVENOUS | Status: DC | PRN

## 2023-04-19 MED ORDER — ACETAMINOPHEN 10 MG/ML IV SOLN
INTRAVENOUS | Status: AC
  Filled 2023-04-19: qty 100

## 2023-04-19 MED ORDER — ARTIFICIAL TEARS OPHTHALMIC OINT
TOPICAL_OINTMENT | OPHTHALMIC | Status: AC
  Filled 2023-04-19: qty 3.5

## 2023-04-19 MED ORDER — METRONIDAZOLE 500 MG/100ML IV SOLN
INTRAVENOUS | Status: DC | PRN
  Administered 2023-04-19: 500 mg via INTRAVENOUS

## 2023-04-19 MED ORDER — AMISULPRIDE (ANTIEMETIC) 5 MG/2ML IV SOLN: 10.0000 mg | Freq: Once | INTRAVENOUS | Status: DC | PRN

## 2023-04-19 MED ORDER — PANTOPRAZOLE SODIUM 40 MG PO TBEC
40.0000 mg | DELAYED_RELEASE_TABLET | Freq: Every day | ORAL | Status: DC
Start: 2023-04-19 — End: 2023-04-19

## 2023-04-19 MED ORDER — HEPARIN 6000 UNIT IRRIGATION SOLUTION
Status: AC
  Filled 2023-04-19: qty 500

## 2023-04-19 MED ORDER — METOPROLOL TARTRATE 5 MG/5ML IV SOLN
2.0000 mg | INTRAVENOUS | Status: DC | PRN
Start: 2023-04-19 — End: 2023-04-27

## 2023-04-19 MED ORDER — ONDANSETRON HCL 4 MG/2ML IJ SOLN
INTRAMUSCULAR | Status: DC | PRN
  Administered 2023-04-19: 4 mg via INTRAVENOUS

## 2023-04-19 MED ORDER — ONDANSETRON HCL 4 MG/2ML IJ SOLN
INTRAMUSCULAR | Status: AC
  Filled 2023-04-19: qty 2

## 2023-04-19 MED ORDER — SODIUM CHLORIDE 0.9 % IV SOLN: 500.0000 mL | Freq: Once | INTRAVENOUS | Status: DC | PRN

## 2023-04-19 MED ORDER — MIDAZOLAM HCL 2 MG/2ML IJ SOLN
INTRAMUSCULAR | Status: DC | PRN
  Administered 2023-04-19: 1 mg via INTRAVENOUS

## 2023-04-19 MED ORDER — LIDOCAINE 2% (20 MG/ML) 5 ML SYRINGE
INTRAMUSCULAR | Status: DC | PRN
  Administered 2023-04-19: 60 mg via INTRAVENOUS

## 2023-04-19 MED ORDER — SODIUM CHLORIDE 0.9 % IV SOLN
INTRAVENOUS | Status: AC
Start: 2023-04-19 — End: 2023-04-20

## 2023-04-19 MED ORDER — SUGAMMADEX SODIUM 200 MG/2ML IV SOLN
INTRAVENOUS | Status: DC | PRN
  Administered 2023-04-19: 300 mg via INTRAVENOUS

## 2023-04-19 MED ORDER — FENTANYL CITRATE (PF) 100 MCG/2ML IJ SOLN: 25.0000 ug | INTRAMUSCULAR | Status: DC | PRN

## 2023-04-19 MED ORDER — MIDAZOLAM HCL 2 MG/2ML IJ SOLN
INTRAMUSCULAR | Status: AC
  Filled 2023-04-19: qty 2

## 2023-04-19 MED ORDER — PROPOFOL 10 MG/ML IV BOLUS
INTRAVENOUS | Status: AC
  Filled 2023-04-19: qty 20

## 2023-04-19 MED ORDER — INSULIN ASPART 100 UNIT/ML IJ SOLN: 0.0000 [IU] | INTRAMUSCULAR | Status: DC | PRN

## 2023-04-19 MED ORDER — ALBUMIN HUMAN 5 % IV SOLN
12.5000 g | Freq: Once | INTRAVENOUS | Status: AC
  Administered 2023-04-19: 12.5 g via INTRAVENOUS

## 2023-04-19 MED ORDER — HEPARIN SODIUM (PORCINE) 1000 UNIT/ML IJ SOLN
INTRAMUSCULAR | Status: DC | PRN
  Administered 2023-04-19: 2000 [IU] via INTRAVENOUS
  Administered 2023-04-19: 10000 [IU] via INTRAVENOUS

## 2023-04-19 MED ORDER — HEPARIN 6000 UNIT IRRIGATION SOLUTION
Status: DC | PRN
  Administered 2023-04-19: 1

## 2023-04-19 MED ORDER — PANTOPRAZOLE SODIUM 40 MG PO TBEC
40.0000 mg | DELAYED_RELEASE_TABLET | Freq: Every day | ORAL | Status: DC
  Administered 2023-04-19 – 2023-04-27 (×8): 40 mg via ORAL
  Filled 2023-04-19 (×9): qty 1

## 2023-04-19 MED ORDER — PROPOFOL 10 MG/ML IV BOLUS
INTRAVENOUS | Status: DC | PRN
  Administered 2023-04-19: 150 mg via INTRAVENOUS
  Administered 2023-04-19: 20 mg via INTRAVENOUS

## 2023-04-19 MED ORDER — LACTATED RINGERS IV SOLN: INTRAVENOUS | Status: DC

## 2023-04-19 MED ORDER — PHENYLEPHRINE 80 MCG/ML (10ML) SYRINGE FOR IV PUSH (FOR BLOOD PRESSURE SUPPORT)
PREFILLED_SYRINGE | INTRAVENOUS | Status: AC
  Filled 2023-04-19: qty 10

## 2023-04-19 MED ORDER — HEPARIN SODIUM (PORCINE) 5000 UNIT/ML IJ SOLN
5000.0000 [IU] | Freq: Three times a day (TID) | INTRAMUSCULAR | Status: DC
  Administered 2023-04-20 – 2023-04-27 (×21): 5000 [IU] via SUBCUTANEOUS
  Filled 2023-04-19 (×21): qty 1

## 2023-04-19 MED ORDER — ALBUMIN HUMAN 5 % IV SOLN
INTRAVENOUS | Status: AC
  Filled 2023-04-19: qty 250

## 2023-04-19 MED ORDER — SURGIFLO WITH THROMBIN (HEMOSTATIC MATRIX KIT) OPTIME
TOPICAL | Status: DC | PRN
  Administered 2023-04-19: 1 via TOPICAL

## 2023-04-19 MED ORDER — PHENYLEPHRINE HCL-NACL 20-0.9 MG/250ML-% IV SOLN
INTRAVENOUS | Status: DC | PRN
  Administered 2023-04-19: 40 ug/min via INTRAVENOUS

## 2023-04-19 MED ORDER — ALBUMIN HUMAN 5 % IV SOLN
12.5000 g | Freq: Once | INTRAVENOUS | Status: DC
Start: 2023-04-19 — End: 2023-04-19

## 2023-04-19 MED ORDER — ALBUMIN HUMAN 5 % IV SOLN: INTRAVENOUS | Status: DC | PRN

## 2023-04-19 MED ORDER — BISACODYL 10 MG RE SUPP
10.0000 mg | Freq: Every day | RECTAL | Status: DC | PRN
Start: 2023-04-19 — End: 2023-04-27

## 2023-04-19 MED ORDER — PROTAMINE SULFATE 10 MG/ML IV SOLN
INTRAVENOUS | Status: AC
  Filled 2023-04-19: qty 5

## 2023-04-19 MED ORDER — GLYCOPYRROLATE PF 0.2 MG/ML IJ SOSY
PREFILLED_SYRINGE | INTRAMUSCULAR | Status: AC
  Filled 2023-04-19: qty 1

## 2023-04-19 MED ORDER — SODIUM CHLORIDE 0.9 % IV SOLN: INTRAVENOUS | Status: DC | PRN

## 2023-04-19 MED ORDER — POTASSIUM CHLORIDE CRYS ER 20 MEQ PO TBCR
20.0000 meq | EXTENDED_RELEASE_TABLET | Freq: Every day | ORAL | Status: DC | PRN
Start: 2023-04-19 — End: 2023-04-27

## 2023-04-19 MED ORDER — LIDOCAINE 2% (20 MG/ML) 5 ML SYRINGE
INTRAMUSCULAR | Status: AC
  Filled 2023-04-19: qty 5

## 2023-04-19 MED ORDER — MORPHINE SULFATE (PF) 2 MG/ML IV SOLN
2.0000 mg | INTRAVENOUS | Status: DC | PRN
  Administered 2023-04-19 – 2023-04-20 (×2): 2 mg via INTRAVENOUS
  Filled 2023-04-19 (×2): qty 1

## 2023-04-19 MED ORDER — ROCURONIUM BROMIDE 10 MG/ML (PF) SYRINGE
PREFILLED_SYRINGE | INTRAVENOUS | Status: AC
  Filled 2023-04-19: qty 10

## 2023-04-19 MED ORDER — FENTANYL CITRATE (PF) 250 MCG/5ML IJ SOLN
INTRAMUSCULAR | Status: AC
  Filled 2023-04-19: qty 5

## 2023-04-19 MED ORDER — ORAL CARE MOUTH RINSE
15.0000 mL | Freq: Once | OROMUCOSAL | Status: AC
  Administered 2023-04-19: 15 mL via OROMUCOSAL

## 2023-04-19 MED ORDER — PROTAMINE SULFATE 10 MG/ML IV SOLN
INTRAVENOUS | Status: DC | PRN
  Administered 2023-04-19: 50 mg via INTRAVENOUS

## 2023-04-19 MED ORDER — PHENYLEPHRINE 80 MCG/ML (10ML) SYRINGE FOR IV PUSH (FOR BLOOD PRESSURE SUPPORT)
PREFILLED_SYRINGE | INTRAVENOUS | Status: DC | PRN
  Administered 2023-04-19: 160 ug via INTRAVENOUS
  Administered 2023-04-19: 80 ug via INTRAVENOUS
  Administered 2023-04-19: 160 ug via INTRAVENOUS
  Administered 2023-04-19 (×7): 80 ug via INTRAVENOUS

## 2023-04-19 MED ORDER — 0.9 % SODIUM CHLORIDE (POUR BTL) OPTIME
TOPICAL | Status: DC | PRN
  Administered 2023-04-19: 1000 mL

## 2023-04-19 MED ORDER — CHLORHEXIDINE GLUCONATE 0.12 % MT SOLN: 15.0000 mL | Freq: Once | OROMUCOSAL | Status: AC

## 2023-04-19 MED ORDER — INSULIN ASPART 100 UNIT/ML IJ SOLN
INTRAMUSCULAR | Status: AC
  Administered 2023-04-19: 2 [IU] via SUBCUTANEOUS
  Filled 2023-04-19: qty 1

## 2023-04-19 MED ORDER — FENTANYL CITRATE (PF) 250 MCG/5ML IJ SOLN
INTRAMUSCULAR | Status: DC | PRN
  Administered 2023-04-19 (×3): 50 ug via INTRAVENOUS
  Administered 2023-04-19: 100 ug via INTRAVENOUS

## 2023-04-19 MED ORDER — ACETAMINOPHEN 10 MG/ML IV SOLN
1000.0000 mg | Freq: Once | INTRAVENOUS | Status: AC
  Administered 2023-04-19: 1000 mg via INTRAVENOUS

## 2023-04-19 MED ORDER — ASPIRIN 81 MG PO TBEC
81.0000 mg | DELAYED_RELEASE_TABLET | Freq: Every day | ORAL | Status: DC
  Administered 2023-04-20 – 2023-04-27 (×7): 81 mg via ORAL
  Filled 2023-04-19 (×7): qty 1

## 2023-04-19 MED ORDER — ROCURONIUM BROMIDE 10 MG/ML (PF) SYRINGE
PREFILLED_SYRINGE | INTRAVENOUS | Status: DC | PRN
  Administered 2023-04-19: 20 mg via INTRAVENOUS
  Administered 2023-04-19: 60 mg via INTRAVENOUS
  Administered 2023-04-19: 20 mg via INTRAVENOUS
  Administered 2023-04-19: 30 mg via INTRAVENOUS

## 2023-04-19 MED ORDER — HEPARIN SODIUM (PORCINE) 1000 UNIT/ML IJ SOLN
INTRAMUSCULAR | Status: AC
Start: 2023-04-19 — End: ?
  Filled 2023-04-19: qty 10

## 2023-04-19 SURGICAL SUPPLY — 59 items
BAG COUNTER SPONGE SURGICOUNT (BAG) ×1 IMPLANT
BAG ISOLATION DRAPE 18X18 (DRAPES) IMPLANT
BANDAGE ESMARK 6X9 LF (GAUZE/BANDAGES/DRESSINGS) IMPLANT
BNDG ESMARK 6X9 LF (GAUZE/BANDAGES/DRESSINGS) ×1 IMPLANT
CANISTER SUCT 3000ML PPV (MISCELLANEOUS) ×1 IMPLANT
CANNULA VESSEL 3MM 2 BLNT TIP (CANNULA) ×1 IMPLANT
CATH EMB 4FR 40 (CATHETERS) IMPLANT
CLIP TI MEDIUM 24 (CLIP) ×1 IMPLANT
CLIP TI WIDE RED SMALL 24 (CLIP) ×1 IMPLANT
CUFF TOURN SGL QUICK 42 (TOURNIQUET CUFF) IMPLANT
CUFF TRNQT CYL 24X4X16.5-23 (TOURNIQUET CUFF) IMPLANT
CUFF TRNQT CYL 34X4.125X (TOURNIQUET CUFF) IMPLANT
DERMABOND ADVANCED .7 DNX12 (GAUZE/BANDAGES/DRESSINGS) ×1 IMPLANT
DRAIN CHANNEL 15F RND FF W/TCR (WOUND CARE) IMPLANT
DRAPE HALF SHEET 40X57 (DRAPES) IMPLANT
DRAPE X-RAY CASS 24X20 (DRAPES) IMPLANT
DRESSING PEEL AND PLC PRVNA 13 (GAUZE/BANDAGES/DRESSINGS) IMPLANT
DRSG PEEL AND PLACE PREVENA 13 (GAUZE/BANDAGES/DRESSINGS) ×1 IMPLANT
ELECT REM PT RETURN 9FT ADLT (ELECTROSURGICAL) ×1 IMPLANT
ELECTRODE REM PT RTRN 9FT ADLT (ELECTROSURGICAL) ×1 IMPLANT
EVACUATOR SILICONE 100CC (DRAIN) IMPLANT
GLOVE SURG SS PI 7.5 STRL IVOR (GLOVE) ×3 IMPLANT
GOWN STRL REUS W/ TWL LRG LVL3 (GOWN DISPOSABLE) ×2 IMPLANT
GOWN STRL REUS W/ TWL XL LVL3 (GOWN DISPOSABLE) ×1 IMPLANT
GRAFT PROPATEN W/RING 6X80X60 (Vascular Products) IMPLANT
HEMOSTAT SNOW SURGICEL 2X4 (HEMOSTASIS) IMPLANT
INSERT FOGARTY SM (MISCELLANEOUS) IMPLANT
KIT BASIN OR (CUSTOM PROCEDURE TRAY) ×1 IMPLANT
KIT DRSG PREVENA PLUS 7DAY 125 (MISCELLANEOUS) IMPLANT
KIT TURNOVER KIT B (KITS) ×1 IMPLANT
MARKER GRAFT CORONARY BYPASS (MISCELLANEOUS) IMPLANT
NS IRRIG 1000ML POUR BTL (IV SOLUTION) ×2 IMPLANT
PACK PERIPHERAL VASCULAR (CUSTOM PROCEDURE TRAY) ×1 IMPLANT
PAD ARMBOARD 7.5X6 YLW CONV (MISCELLANEOUS) ×2 IMPLANT
SET COLLECT BLD 21X3/4 12 (NEEDLE) IMPLANT
SPONGE T-LAP 18X18 ~~LOC~~+RFID (SPONGE) IMPLANT
STOPCOCK 4 WAY LG BORE MALE ST (IV SETS) IMPLANT
SURGIFLO W/THROMBIN 8M KIT (HEMOSTASIS) IMPLANT
SUT ETHILON 3 0 PS 1 (SUTURE) IMPLANT
SUT GORETEX 6.0 TT13 (SUTURE) IMPLANT
SUT GORETEX 6.0 TT9 (SUTURE) IMPLANT
SUT MNCRL AB 4-0 PS2 18 (SUTURE) IMPLANT
SUT PROLENE 5 0 C 1 24 (SUTURE) ×1 IMPLANT
SUT PROLENE 5 0 C 1 36 (SUTURE) IMPLANT
SUT PROLENE 6 0 BV (SUTURE) ×1 IMPLANT
SUT PROLENE 7 0 BV 1 (SUTURE) IMPLANT
SUT SILK 2 0 SH (SUTURE) ×1 IMPLANT
SUT SILK 3-0 18XBRD TIE 12 (SUTURE) IMPLANT
SUT VIC AB 2-0 CT1 TAPERPNT 27 (SUTURE) ×2 IMPLANT
SUT VIC AB 3-0 SH 27X BRD (SUTURE) ×2 IMPLANT
SUT VIC AB 4-0 PS2 18 (SUTURE) IMPLANT
SUT VIC AB 4-0 PS2 27 (SUTURE) IMPLANT
SUT VICRYL 4-0 PS2 18IN ABS (SUTURE) ×2 IMPLANT
SYR 3ML LL SCALE MARK (SYRINGE) IMPLANT
TOWEL GREEN STERILE (TOWEL DISPOSABLE) ×1 IMPLANT
TRAY FOLEY MTR SLVR 16FR STAT (SET/KITS/TRAYS/PACK) ×1 IMPLANT
TUBING EXTENTION W/L.L. (IV SETS) IMPLANT
UNDERPAD 30X36 HEAVY ABSORB (UNDERPADS AND DIAPERS) ×1 IMPLANT
WATER STERILE IRR 1000ML POUR (IV SOLUTION) ×1 IMPLANT

## 2023-04-19 NOTE — Anesthesia Procedure Notes (Signed)
Arterial Line Insertion Start/End1/22/2025 9:40 AM Performed by: Garfield Cornea, CRNA, CRNA  Patient location: Pre-op. Preanesthetic checklist: patient identified, IV checked, site marked, risks and benefits discussed, surgical consent, monitors and equipment checked, pre-op evaluation, timeout performed and anesthesia consent Lidocaine 1% used for infiltration Left, radial was placed Catheter size: 20 G Hand hygiene performed  and maximum sterile barriers used   Attempts: 2 Procedure performed without using ultrasound guided technique. Following insertion, dressing applied and Biopatch. Post procedure assessment: normal and unchanged  Patient tolerated the procedure well with no immediate complications.

## 2023-04-19 NOTE — Anesthesia Postprocedure Evaluation (Signed)
Anesthesia Post Note  Patient: Michael Chambers  Procedure(s) Performed: LEFT FEMORAL-POPLITEAL ARTERY BYPASS WITH VEIN USING 6 MM GORE PROPATEN VASCULAR REMOVABLE RING GRAFT (Left: Leg Lower)     Patient location during evaluation: PACU Anesthesia Type: General Level of consciousness: awake and alert Pain management: pain level controlled Vital Signs Assessment: post-procedure vital signs reviewed and stable Respiratory status: spontaneous breathing, nonlabored ventilation, respiratory function stable and patient connected to nasal cannula oxygen Cardiovascular status: blood pressure returned to baseline and stable Postop Assessment: no apparent nausea or vomiting Anesthetic complications: no  There were no known notable events for this encounter.  Last Vitals:  Vitals:   04/19/23 1645 04/19/23 1711  BP: 122/84   Pulse: 84 88  Resp: 20 17  Temp: 36.6 C   SpO2: 95% 97%    Last Pain:  Vitals:   04/19/23 1711  TempSrc:   PainSc: 9                  Kennieth Rad

## 2023-04-19 NOTE — Op Note (Signed)
[name] MRN: [id number] DOB: 10-15-1960 Sex: male  04/19/2023 Pre-operative Diagnosis: Left foot gangrene Post-operative diagnosis:  Same Surgeon:  Durene Cal Assistants:  Doreatha Massed Procedure:   #1: left femoral to TP trunk bypass with 6mm eternal ring PTFE   #2: Prevena wound VAC  Anesthesia:  General Blood Loss:  300cc Specimens:  none  Findings:  Femoral endarterectomy.  Proximal graft was spatulated for the proximal anastomosis for about 3 cm.  The heel of the anastomosis was on the proximal profundofemoral artery.  The crossing circumflex iliac vein was divided under the inguinal ligament.  The distal anastomosis was to the distal popliteal and tibioperoneal trunk.  I divided the anterior tibial vein.  The patient had adequate saphenous vein beginning at the mid thigh.  The proximal saphenous vein had been ligated from a previous surgery.  I did not want to harvest his vein down to the ankle because of the condition of his skin.  Indications: This is a 63 year old gentleman with history of left superficial femoral artery stenting with occluded.  He now has extensive wounds on his foot.  He comes in today for surgical revascularization  Procedure:  The patient was identified in the holding area and taken to [facility] OR ROOM 16  The patient was then placed supine on the table. general anesthesia was administered.  The patient was prepped and draped in the usual sterile fashion.  A time out was called and antibiotics were administered.  A PA was necessary to expedite the procedure and assist with technical details.  She helped with exposure by providing suction and retraction.  She helped with the anastomoses by following the suture.  She helped with wound closure.  An oblique incision was made above the groin crease.  Cautery was used to divide the subcutaneous tissue down to the femoral sheath which was opened sharply.  I exposed the common femoral artery from the  inguinal ligament down to the bifurcation.  The profunda was exposed for approximately 1.5 cm.  I did not divide the vein.  The crossing circumflex iliac vein was ligated underneath inguinal ligament.  He did have extensive femoral calcification.  Next ultrasound was used to evaluate the saphenous vein.  He had an adequate saphenous vein below the knee and up to the mid distal thigh where it was no longer visible.  This did correlate with a previous incision he had on his leg.  I then tried to find the saphenofemoral junction and there was no real saphenous vein at this level.  At this point, I felt prosthetic graft was necessary because incisions down below the calf would likely not heal because of the condition of the skin.  I then made a medial below-knee incision.  Cautery was used divide subcutaneous tissue down to the fascia which was opened with cautery.  I took down some of the tibial attachments to the soleus muscle and expose the popliteal artery and vein.  I then ligated the anterior tibial vein.  This exposed the below-knee popliteal artery and tibioperoneal trunk as well as the origin of the anterior tibial artery.  There was heavy calcification in the distal below-knee popliteal artery.  Next, a long curved tunneler was used to create a anatomic tunnel between the 2 incisions.  The patient was then fully heparinized.  After the heparin circulated the femoral vessels were occluded.  A #11 blade was used to make an arteriotomy which was extended longitudinally with Windle Guard  scissors.  The patient had extensive posterior plaque with exophytic calcification.  I performed endarterectomy of the common femoral artery extending to the origin of the profundofemoral artery.  This was continued up to the inguinal ligament.  I used a balloon for proximal control to make sure I got a good endpoint.  At this point there was excellent inflow.  The endarterectomized plane was irrigated.  All potential embolic debris  was removed.  A 6 mm external ring PTFE graft was brought onto the field.  This was spatulated to fit the size the arteriotomy which was approximately 3 centimeters.  Anastomosis was performed with running 5-0 Prolene.  Prior to completion, the appropriate flushing maneuvers were performed and the anastomosis was completed.  There was excellent flow through the graft.  The graft was then brought in the previously created tunnel making sure to maintain proper orientation.  Next, a tourniquet was placed in the upper thigh.  The leg was exsanguinated with an Esmarch.  A tourniquet was taken at 250 mm of pressure.  I then used an 11 blade to open the distal popliteal artery.  There was calcific plaque at this level.  I opened the arteriotomy onto the tibioperoneal trunk and then further into the below-knee popliteal artery.  The leg was straightened and the graft was cut the appropriate length.  It was spatulated to fit the size of the arteriotomy and a running anastomosis was created with 6-0 Prolene.  Prior to completion, the appropriate flushing maneuvers were performed and the anastomosis was completed.  Tourniquet was then completely removed and blood flow was established to the left leg.  The patient had brisk graft dependent tibial Doppler signals.  I was satisfied with these results.  The patient's heparin was reversed with 50 mg of protamine.  The wounds were irrigated.  Hemostasis was achieved.  The below-knee incision was closed by reapproximating the fascia with 2-0 Vicryl, the subcutaneous tissue with 3-0 Vicryl, and the skin with 4 Monocryl.  The groin was closed by reapproximating the femoral sheath with 2-0 Vicryl, the subcutaneous tissue was closed in 2 layers of 3-0 Vicryl followed by 4-0 subcuticular closure.  A Prevena wound VAC was placed on the groin incision.  Dermabond was applied to the below-knee incision.  The patient tolerated procedure well.  There were no  immediate  complications.   Disposition: To PACU stable.   Juleen China, M.D., [facility] [place] Office: [phone] Pager:  [phone]

## 2023-04-19 NOTE — Anesthesia Procedure Notes (Signed)
Procedure Name: Intubation Date/Time: 04/19/2023 10:34 AM  Performed by: Sharyn Dross, CRNAPre-anesthesia Checklist: Patient identified, Emergency Drugs available, Suction available and Patient being monitored Patient Re-evaluated:Patient Re-evaluated prior to induction Oxygen Delivery Method: Circle system utilized Preoxygenation: Pre-oxygenation with 100% oxygen Induction Type: IV induction and Cricoid Pressure applied Ventilation: Mask ventilation without difficulty and Two handed mask ventilation required Tube type: Oral Tube size: 7.5 mm Number of attempts: 1 Airway Equipment and Method: Stylet and Oral airway Placement Confirmation: ETT inserted through vocal cords under direct vision, positive ETCO2 and breath sounds checked- equal and bilateral Secured at: 24 cm Tube secured with: Tape Dental Injury: Teeth and Oropharynx as per pre-operative assessment  Comments: Intubated by Kenney Houseman, SRNA

## 2023-04-19 NOTE — Inpatient Diabetes Management (Signed)
Inpatient Diabetes Program Recommendations  AACE/ADA: New Consensus Statement on Inpatient Glycemic Control (2015)  Target Ranges:  Prepandial:   less than 140 mg/dL      Peak postprandial:   less than 180 mg/dL (1-2 hours)      Critically ill patients:  140 - 180 mg/dL   Lab Results  Component Value Date   GLUCAP 179 (H) 04/19/2023   HGBA1C 7.6 (H) 04/16/2023    Review of Glycemic Control  Latest Reference Range & Units 04/18/23 16:49 04/18/23 21:37 04/19/23 06:23 04/19/23 08:44  Glucose-Capillary 70 - 99 mg/dL 161 (H) 096 (H) 045 (H) 179 (H)   Diabetes history: DM 2 Outpatient Diabetes medications: none noted Current orders for Inpatient glycemic control: Novolog 0-15 units tid + hs  A1c 7.6% on 1/19  Inpatient Diabetes Program Recommendations:    May need to consider an oral agent at time of d/c for tight glucose control for wound healing.  Thanks, Christena Deem RN, MSN, BC-ADM Inpatient Diabetes Coordinator Team Pager [phone] (8a-5p)

## 2023-04-19 NOTE — Interval H&P Note (Signed)
History and Physical Interval Note:  04/19/2023 9:51 AM  Michael Chambers  has presented today for surgery, with the diagnosis of peripheral artery diease.  The various methods of treatment have been discussed with the patient and family. After consideration of risks, benefits and other options for treatment, the patient has consented to  Procedure(s): BYPASS GRAFT FEMORAL-POPLITEAL ARTERY WITH VEIN (Left) as a surgical intervention.  The patient's history has been reviewed, patient examined, no change in status, stable for surgery.  I have reviewed the patient's chart and labs.  Questions were answered to the patient's satisfaction.     Durene Cal

## 2023-04-19 NOTE — Transfer of Care (Signed)
Immediate Anesthesia Transfer of Care Note  Patient: Michael Chambers  Procedure(s) Performed: LEFT FEMORAL-POPLITEAL ARTERY BYPASS WITH VEIN USING 6 MM GORE PROPATEN VASCULAR REMOVABLE RING GRAFT (Left: Leg Lower)  Patient Location: PACU  Anesthesia Type:General  Level of Consciousness: awake, alert , and oriented  Airway & Oxygen Therapy: Patient Spontanous Breathing and Patient connected to face mask oxygen  Post-op Assessment: Report given to RN and Post -op Vital signs reviewed and stable  Post vital signs: Reviewed and stable  Last Vitals:  Vitals Value Taken Time  BP 166/78 04/19/23 1424  Temp    Pulse 101 04/19/23 1429  Resp 12 04/19/23 1429  SpO2 95 % 04/19/23 1429  Vitals shown include unfiled device data.  Last Pain:  Vitals:   04/19/23 0845  TempSrc:   PainSc: 8       Patients Stated Pain Goal: 0 (04/17/23 1845)  Complications: There were no known notable events for this encounter.

## 2023-04-19 NOTE — Progress Notes (Addendum)
  Progress Note    04/19/2023 6:37 AM 1 Day Post-Op  Subjective:  no questions this morning.   afebrile  Vitals:   04/18/23 2335 04/19/23 0345  BP: (!) 159/95 (!) 111/50  Pulse: 80 77  Resp: 20 14  Temp: 98 F (36.7 C) 97.8 F (36.6 C)  SpO2: 91% 95%    Physical Exam: General:  no distress Lungs:  non labored  CBC    Component Value Date/Time   WBC 13.0 (H) 04/18/2023 0535   RBC 4.84 04/18/2023 0535   HGB 14.0 04/18/2023 0535   HCT 42.3 04/18/2023 0535   PLT 347 04/18/2023 0535   MCV 87.4 04/18/2023 0535   MCH 28.9 04/18/2023 0535   MCHC 33.1 04/18/2023 0535   RDW 13.1 04/18/2023 0535   LYMPHSABS 3.8 06/01/2021 0052   MONOABS 0.8 06/01/2021 0052   EOSABS 0.2 06/01/2021 0052   BASOSABS 0.1 06/01/2021 0052    BMET    Component Value Date/Time   NA 136 04/18/2023 0535   K 4.1 04/18/2023 0535   CL 99 04/18/2023 0535   CO2 28 04/18/2023 0535   GLUCOSE 184 (H) 04/18/2023 0535   BUN 9 04/18/2023 0535   CREATININE 0.79 04/18/2023 0535   CALCIUM 8.6 (L) 04/18/2023 0535   GFRNONAA >60 04/18/2023 0535   GFRAA >60 03/16/2016 0859    INR    Component Value Date/Time   INR 1.0 05/31/2021 1117     Intake/Output Summary (Last 24 hours) at 04/19/2023 3244 Last data filed at 04/19/2023 0347 Gross per 24 hour  Intake 1500.92 ml  Output 1925 ml  Net -424.08 ml      Assessment/Plan:  63 y.o. male is s/p:  Angiogram via right CFA  1 Day Post-Op   -for left leg bypass with GSV today.  No questions.   -continue npo    Doreatha Massed, PA-C [facility] [phone] 04/19/2023 6:37 AM

## 2023-04-19 NOTE — Discharge Instructions (Signed)

## 2023-04-19 NOTE — Hospital Course (Addendum)
Michael Chambers is a 63 y.o. male with medical history significant of hypertension, atrial flutter, carotid artery disease status post left endarterectomy, PAD status post Pham pop stenting in 2023, CAD, CRAO, diabetes, cocaine use presenting from [facility] with left great toe gangrene.   **Interim History Vascular surgery and podiatry have been consulted and patient underwent a abdominal aortogram and bilateral lower extremity angiogram which showed bilateral femoral artery occlusion.  Given his occlusion patient was scheduled for a left femoral to below-knee popliteal artery bypass graft and this was done today.  Current plan is for left hallux amputation at the MPJ level in the a.m. as he has been optimized from a vascular standpoint.  Will continue IV antibiotics and may need to discuss with ID for further antibiotic duration and management.  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 No cortical destruction identified to suggest osteomyelitis. -Podiatry and vascular surgery follow up appreciated. -He possibly will end up with left hallux amputation. -Vascular Surgery evaluation appreciated and patient underwent a ultrasound-guided right femoral and abdominal aortogram bilateral lower extremity angiogram which showed bilateral femoral artery occlusion; subsequently given his occlusion patient was scheduled for a left femoral to below the knee popliteal artery bypass graft today and is still in the PACU -Continue IV Vancomycin, Cefepime, and Flagyl. -WBC Trend: Recent Labs  Lab 04/17/23 0610 04/18/23 0535 04/19/23 0307 04/19/23 1511 04/19/23 1931 04/20/23 0321 04/21/23 0312  WBC 11.6* 13.0* 15.0* 15.8* 16.1* 11.9* 12.9*  -Given his softer blood pressures and hypotension he is getting IV albumin -Continue Pain Control. -Plan is for total  amputation on Friday, 04/21/2023 with podiatry and currently his Clopidogrel is being held -Aerobic/Anaerobic Cx sent and showing "ABUNDANT GRAM POSITIVE COCCI IN PAIRS  FEW GRAM NEGATIVE RODS" -PT OT recommending Home Health   Hypertension -Continued home Lisinopril but changed to Lisinopril 20; upon review of his medications he was taking both lisinopril and losartan; will stop his losartan for now -Was hypotensive for a little while and ended up on on pressors under Anesthesia and subsequently after received IV Albumin; Vascular Surgery obtaining CBC now to check for ABLA -IF BP fails to improve will need Pressors Short term and PCCM Consult -Continue to Monitor blood pressure closely -Last BP reading has improved to 129/82   Atrial Flutter -Heart rate stable. Not on rate/rhythm/nor anticoagulation meds -Continue to Monitor on Telemetry    Carotid Artery Disease -Status post left carotid endarterectomy -Vascular surgery has started the patient on baby aspirin 81 mg p.o. daily today and recommended to continue to hold  Clopidogrel -Continue rosuvastatin 10 mg p.o. nightly   PAD -Status post Left femoral-popliteal angioplasty and stent placement in 2023 and is now status post left femoral endarterectomy and left femoral to below knee popliteal bypass with PTFE -Will follow vascular surgery recommendations. -Clopidogrel being held and patient was to be started on baby aspirin later today; plan is for toe amputation today Friday with podiatry and will need to restart his clopidogrel prior to his discharge when ok with Podiatry   CAD -Home Plavix currently held; resumed Home ASA 81 mg po Daily  -Continue Rosuvastatin 20 mg po at bedtime -Lipid panel done and showed a total cholesterol/HDL ratio of 3.3, cholesterol level 102, HDL 31, LDL 56, triglycerides of 76, VLDL 15  Hypophosphatemia -Phos Level Trend: Recent Labs  Lab 04/19/23 [phone]  04/20/23 0321 04/21/23 0312  PHOS 3.2 2.7 2.2*   -Replete with IV Sodium Phos 15 mmol -Continue to Monitor and replete as Necessary -Repeat Phos level in the AM  Hyponatremia -Mild. Na+ Trend: Recent Labs  Lab 04/17/23 0610 04/18/23 0535 04/19/23 0307 04/19/23 1228 04/20/23 0321 04/21/23 0312  NA 133* 136 135 139 132* 134*  -Replete with IV Sodium Phos 15 mmol  -Continue to Monitor and Trend and repeat CMP in the AM   Type II Diabetes Mellitus -Blood sugars controlled. -HbA1c was 7.6 -Continue Accu-Cheks and Moderate Novolog Sliding Scale Insulin -Continue to Monitor and Trend CBGs; Current CBG Trend: Recent Labs  Lab 04/20/23 0608 04/20/23 1148 04/20/23 1650 04/20/23 2132 04/21/23 0600 04/21/23 0818 04/21/23 1148  GLUCAP 136* 192* 191* 222* 200* 187* 173*   ABLA/Normocytic Anemia -Hgb/Hct Trend: Recent Labs  Lab 04/18/23 0535 04/19/23 0307 04/19/23 1228 04/19/23 1511 04/19/23 1931 04/20/23 0321 04/21/23 0312  HGB 14.0 11.7* 14.6 11.7* 11.6* 11.2* 11.8*  HCT 42.3 36.2* 43.0 36.6* 36.0* 35.0* 35.5*  MCV 87.4 91.0  --  90.6 89.6 90.7 87.4  -Checked Anemia Panel and showed an iron level of 26, UIBC of 223, TIBC of 249, saturation of 10%, ferritin level 125, folate level 8.0 and vitamin B12 170 -Will initiate B12 supplementation -Continue to Monitor S/Sx of Bleeding; Repeat CBC pending this Afternoon -Repeat CBC in the AM   Hypoalbuminemia -Patient's Albumin Trend: Recent Labs  Lab 04/17/23 0610 04/19/23 0307 04/20/23 0321 04/21/23 0312  ALBUMIN 2.9* 3.0* 3.2* 3.0*  -Continue to Monitor and Trend and repeat CMP in the AM  Class II Obesity -Complicates overall prognosis and care -Estimated body mass index is 35.44 kg/m as calculated from the following:   Height as of this encounter: 5\' 9"  (1.753 m).   Weight as of this encounter: 108.9 kg.  -Weight Loss and Dietary Counseling given and Exercise recommended
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
## 2023-04-19 NOTE — Progress Notes (Signed)
PROGRESS NOTE    Michael Chambers  WUJ:[id number] DOB: 03-02-1961 DOA: 04/16/2023 PCP: Kirstie Peri, MD   Brief Narrative:  Michael Chambers is a 63 y.o. male with medical history significant of hypertension, atrial flutter, carotid artery disease status post left endarterectomy, PAD status post Pham pop stenting in 2023, CAD, CRAO, diabetes, cocaine use presenting from [facility] with left great toe gangrene.   **Interim History Vascular surgery and podiatry have been consulted and patient underwent a abdominal aortogram and bilateral lower extremity angiogram which showed bilateral femoral artery occlusion.  Given his occlusion patient was scheduled for a left femoral to below-knee popliteal artery bypass graft and this was done today.  He is postoperative day 0 and in the PACU and currently is hypotensive  Assessment and Plan:  Left Great Toe Gangrene Diabetic foot infection -MRI left foot - Findings are suspicious for necrotizing soft tissue infection. No cortical destruction identified to suggest osteomyelitis. -Podiatry and vascular surgery follow up appreciated. -He possibly will end up with left hallux amputation. -Vascular Surgery evaluation appreciated and patient underwent a ultrasound-guided right femoral and abdominal aortogram bilateral lower extremity angiogram which showed bilateral femoral artery occlusion; subsequently given his occlusion patient was scheduled for a left femoral to below the knee popliteal artery bypass graft today and is still in the PACU -Continue IV Vancomycin, Cefepime, and Flagyl. -WBC Trend: Recent Labs  Lab 04/17/23 0610 04/18/23 0535 04/19/23 0307  WBC 11.6* 13.0* 15.0*  -Given his softer blood pressures and hypotension he is getting IV albumin -Continue pain control. -Plan is for total amputation on Friday, 04/21/2023 with podiatry and currently his clopidogrel is being held   Hypertension -> Hypotension  -Continued home Lisinopril but  will need to hold -Was on pressors under Anesthesia -Getting IV Albumin; Vascular Surgery obtaining CBC now to check for ABLA -IF BP fails to improve will need Pressors Short term and PCCM Consult -Continue to Monitor blood pressure closely -Last BP reading was 75/60   Atrial Flutter -Heart rate stable. Not on rate/rhythm/nor anticoagulation meds -Continue to Monitor on Telemetry    Carotid Artery Disease -Status post left carotid endarterectomy -Vascular surgery has started the patient on baby aspirin 81 mg p.o. daily today and recommended to continue to hold  Clopidogrel -Continue rosuvastatin 10 mg p.o. nightly   PAD -Status post Left femoral-popliteal angioplasty and stent placement in 2023 and is now status post left femoral endarterectomy and left femoral to below knee popliteal bypass with PTFE -Will follow vascular surgery recommendations. -Clopidogrel being held and patient was to be started on baby aspirin later today; plan is for toe amputation on Friday with podiatry and will need to restart his clopidogrel prior to his discharge   CAD -Home Aspirin and Plavix currently held -Continue Rosuvastatin 20 mg po at bedtime -With panel done and showed a total cholesterol/HDL ratio of 3.3, cholesterol level 102, HDL 31, LDL 56, triglycerides of 76, VLDL 15   Type II Diabetes Mellitus -Blood sugars controlled. -HbA1c was 7.6 -Continue Accu-Cheks and Moderate Novolog Sliding Scale Insulin -Continue to Monitor and Trend CBGs; Current CBG Trend: Recent Labs  Lab 04/18/23 1110 04/18/23 1649 04/18/23 2137 04/19/23 0623 04/19/23 0844 04/19/23 1222 04/19/23 1427  GLUCAP 176* 308* 216* 163* 179* 139* 133*   ABLA/Normocytic Anemia -Hgb/Hct Trend: Recent Labs  Lab 04/17/23 0610 04/18/23 0535 04/19/23 0307  HGB 14.5 14.0 11.7*  HCT 43.3 42.3 36.2*  MCV 86.8 87.4 91.0  -Check Anemia Panel in the AM -  Continue to Monitor S/Sx of Bleeding; Repeat CBC pending this  Afternoon -Repeat CBC in the AM   Hypoalbuminemia -Patient's Albumin Trend: Recent Labs  Lab 04/17/23 0610 04/19/23 0307  ALBUMIN 2.9* 3.0*  -Continue to Monitor and Trend and repeat CMP in the AM  Class II Obesity -Complicates overall prognosis and care -Estimated body mass index is 35.44 kg/m as calculated from the following:   Height as of this encounter: 5\' 9"  (1.753 m).   Weight as of this encounter: 108.9 kg.  -Weight Loss and Dietary Counseling given and Exercise recommended    DVT prophylaxis: SCDs Start: 04/16/23 1723    Code Status: Full Code Family Communication: No family present at bedside  Disposition Plan:  Level of care: Progressive Cardiac Status is: Inpatient Remains inpatient appropriate because: needs further clinical improvement and clearance by Specialists    Consultants:  Vascular Surgery Podiatry  Procedures:  Procedure Performed:            1.  Ultrasound-guided access, right femoral artery            2.  Abdominal aortogram            3.  Catheter selection into the left external iliac artery (second-order)            4.  Bilateral lower extremity angiogram            5.  Conscious sedation, 15 minutes            6.  Closure device, Mynx   s/p  Left femoral endarterectomy; left femoral to below knee popliteal bypass with PTFE  Antimicrobials:  Anti-infectives (From admission, onward)    Start     Dose/Rate Route Frequency Ordered Stop   04/18/23 2200  [MAR Hold]  metroNIDAZOLE (FLAGYL) tablet 500 mg        (MAR Hold since Wed 04/19/2023 at 0835.Hold Reason: Transfer to a Procedural area)   500 mg Oral Every 12 hours 04/18/23 1336     04/16/23 2000  metroNIDAZOLE (FLAGYL) IVPB 500 mg  Status:  Discontinued        500 mg 100 mL/hr over 60 Minutes Intravenous Every 12 hours 04/16/23 1729 04/18/23 1336   04/16/23 1900  [MAR Hold]  ceFEPIme (MAXIPIME) 2 g in sodium chloride 0.9 % 100 mL IVPB        (MAR Hold since Wed 04/19/2023 at 0835.Hold  Reason: Transfer to a Procedural area)   2 g 200 mL/hr over 30 Minutes Intravenous Every 12 hours 04/16/23 1751     04/16/23 1900  [MAR Hold]  vancomycin (VANCOCIN) IVPB 1000 mg/200 mL premix        (MAR Hold since Wed 04/19/2023 at 0835.Hold Reason: Transfer to a Procedural area)   1,000 mg 200 mL/hr over 60 Minutes Intravenous Every 12 hours 04/16/23 1751         Subjective: Seen and examined in the PACU and he was still Xerofoam and drowsy but also hypotensive.  Complaining of some back pain.  States his leg is doing okay.  No other concerns or complaints this time.  Objective: Vitals:   04/19/23 1500 04/19/23 1515 04/19/23 1530 04/19/23 1545  BP: (!) 85/60 (!) 75/60 (!) 71/33 92/76  Pulse: 87 83 76 72  Resp: 17 17 19 15   Temp:      TempSrc:      SpO2: 90% 90% 92% 96%  Weight:      Height:  Intake/Output Summary (Last 24 hours) at 04/19/2023 1559 Last data filed at 04/19/2023 1545 Gross per 24 hour  Intake 2870.92 ml  Output 2365 ml  Net 505.92 ml   Filed Weights   04/16/23 1639 04/19/23 0840  Weight: 109.3 kg 108.9 kg   Examination: Physical Exam:  Constitutional: WN/WD obese chronically ill-appearing Caucasian male who appears uncomfortable Respiratory: Diminished to auscultation bilaterally, no wheezing, rales, rhonchi or crackles. Normal respiratory effort and patient is not tachypenic. No accessory muscle use.  Unlabored breathing Cardiovascular: RRR, no murmurs / rubs / gallops. S1 and S2 auscultated. No extremity edema.  Abdomen: Soft, non-tender, distended secondary to body habitus. Bowel sounds positive.  GU: Deferred. Musculoskeletal: No clubbing / cyanosis of digits/nails. No joint deformity upper and lower extremities.  Skin: No rashes, lesions, ulcers on limited skin evaluation. No induration; Warm and dry.  Neurologic: CN 2-12 grossly intact with no focal deficits was a little somnolent and drowsy and a little confused after his  anesthesia Psychiatric: He is awake and alert  Data Reviewed: I have personally reviewed following labs and imaging studies  CBC: Recent Labs  Lab 04/17/23 0610 04/18/23 0535 04/19/23 0307  WBC 11.6* 13.0* 15.0*  NEUTROABS  --   --  10.7*  HGB 14.5 14.0 11.7*  HCT 43.3 42.3 36.2*  MCV 86.8 87.4 91.0  PLT 374 347 352   Basic Metabolic Panel: Recent Labs  Lab 04/17/23 0610 04/18/23 0535 04/19/23 0307  NA 133* 136 135  K 3.7 4.1 4.4  CL 101 99 101  CO2 25 28 22   GLUCOSE 180* 184* 171*  BUN 7* 9 14  CREATININE 0.84 0.79 0.89  CALCIUM 8.8* 8.6* 9.1  MG  --   --  2.1  PHOS  --   --  3.2   GFR: Estimated Creatinine Clearance: 103.3 mL/min (by C-G formula based on SCr of 0.89 mg/dL). Liver Function Tests: Recent Labs  Lab 04/17/23 0610 04/19/23 0307  AST 26 18  ALT 27 23  ALKPHOS 72 70  BILITOT 0.9 0.4  PROT 6.1* 6.0*  ALBUMIN 2.9* 3.0*   No results for input(s): "LIPASE", "AMYLASE" in the last 168 hours. No results for input(s): "AMMONIA" in the last 168 hours. Coagulation Profile: No results for input(s): "INR", "PROTIME" in the last 168 hours. Cardiac Enzymes: No results for input(s): "CKTOTAL", "CKMB", "CKMBINDEX", "TROPONINI" in the last 168 hours. BNP (last 3 results) No results for input(s): "PROBNP" in the last 8760 hours. HbA1C: Recent Labs    04/16/23 1843  HGBA1C 7.6*   CBG: Recent Labs  Lab 04/18/23 2137 04/19/23 0623 04/19/23 0844 04/19/23 1222 04/19/23 1427  GLUCAP 216* 163* 179* 139* 133*   Lipid Profile: Recent Labs    04/19/23 0307  CHOL 102  HDL 31*  LDLCALC 56  TRIG 76  CHOLHDL 3.3   Thyroid Function Tests: No results for input(s): "TSH", "T4TOTAL", "FREET4", "T3FREE", "THYROIDAB" in the last 72 hours. Anemia Panel: No results for input(s): "VITAMINB12", "FOLATE", "FERRITIN", "TIBC", "IRON", "RETICCTPCT" in the last 72 hours. Sepsis Labs: No results for input(s): "PROCALCITON", "LATICACIDVEN" in the last 168  hours.  Recent Results (from the past 240 hours)  Surgical pcr screen     Status: None   Collection Time: 04/18/23 11:38 PM   Specimen: Nasal Mucosa; Nasal Swab  Result Value Ref Range Status   MRSA, PCR NEGATIVE NEGATIVE Final   Staphylococcus aureus NEGATIVE NEGATIVE Final    Comment: (NOTE) The Xpert SA Assay (FDA approved for  NASAL specimens in patients 9 years of age and older), is one component of a comprehensive surveillance program. It is not intended to diagnose infection nor to guide or monitor treatment. Performed at [facility], [address], [place], [place] [address]      Radiology Studies: PERIPHERAL VASCULAR CATHETERIZATION Result Date: 04/18/2023 Images from the original result were not included. [name] MRN: [id number] DOB: Mar 01, 1961 Sex: male 04/18/2023 Pre-operative Diagnosis: Left foot gangrene Post-operative diagnosis:  Same Surgeon:  Durene Cal Procedure Performed:  1.  Ultrasound-guided access, right femoral artery  2.  Abdominal aortogram  3.  Catheter selection into the left external iliac artery (second-order)  4.  Bilateral lower extremity angiogram  5.  Conscious sedation, 15 minutes  6.  Closure device, Mynx Indications: This is a 63 year old gentleman with wounds to his left foot.  He has previously undergone stenting of the left superficial femoral and popliteal artery which were occluded on ultrasound.  He comes in today for further evaluation. Procedure:  The patient was identified in the holding area and taken to room 8.  The patient was then placed supine on the table and prepped and draped in the usual sterile fashion.  A time out was called.  Conscious sedation was administered with the use of IV fentanyl and Versed under continuous physician and nurse monitoring.  Heart rate, blood pressure, and oxygen saturation were continuously monitored.  Total sedation time was 15 minutes.  Ultrasound was used to evaluate the right  common femoral artery.  It was patent .  A digital ultrasound image was acquired.  A micropuncture needle was used to access the right common femoral artery under ultrasound guidance.  An 018 wire was advanced without resistance and a micropuncture sheath was placed.  The 018 wire was removed and a benson wire was placed.  The micropuncture sheath was exchanged for a 5 french sheath.  An omniflush catheter was advanced over the wire to the level of L-1.  An abdominal angiogram was obtained.  Next, using the omniflush catheter and a benson wire, the aortic bifurcation was crossed and the catheter was placed into theleft external iliac artery and left runoff was obtained.  right runoff was performed via retrograde sheath injections. Findings:  Aortogram: No significant renal artery stenosis was visualized.  The infrarenal abdominal aorta is widely patent.  Bilateral common and external iliac arteries are widely patent.  Right Lower Extremity: The right common femoral and profundofemoral artery are widely patent.  The superficial femoral artery has a short segment occlusion in the adductor canal.  Popliteal artery reconstitutes with two-vessel runoff via the posterior tibial anterior tibial artery  Left Lower Extremity: Left common femoral and profundofemoral artery are widely patent.  The superficial femoral artery is patent proximally however occludes within the previously placed stents.  There is reconstitution of the distal below-knee popliteal artery with three-vessel runoff Intervention: None.  The groin was closed with a Mynx Impression:  #1  Bilateral superficial femoral artery occlusion.  #2  Patient will be scheduled for a left femoral to below-knee popliteal artery bypass graft tomorrow. Juleen China, M.D., [facility] [place] Office: [phone] Pager:  [phone]   MR FOOT LEFT W WO CONTRAST Result Date: 04/18/2023 CLINICAL DATA:  Soft tissue infection suspected.  EXAM: MRI OF THE LEFT FOREFOOT WITHOUT AND WITH CONTRAST TECHNIQUE: Multiplanar, multisequence MR imaging of the left forefoot was performed both before and after administration of intravenous  contrast. CONTRAST:  10mL GADAVIST GADOBUTROL 1 MMOL/ML IV SOLN COMPARISON:  Radiographs 04/15/2023.  MRI 06/01/2021. FINDINGS: Technical note: Despite efforts by the technologist and patient, mild motion artifact is present on today's exam and could not be eliminated. This reduces exam sensitivity and specificity. Bones/Joint/Cartilage Bone detail limited by motion. In correlation with the recent radiographs, there is soft tissue emphysema within the great toe. There is suspected intraosseous extension of gas into the distal phalanx, best seen on the coronal images. No cortical destruction identified. There is no suspicious marrow T2 signal or enhancement. The distal phalanx of the 2nd toe appears absent, unchanged from recent radiographs and previous MRI. The head of the 5th proximal phalanx appears chronically eroded. There are mild degenerative changes at the 1st metatarsophalangeal joint. No significant joint effusions or suspicious synovial enhancement. Ligaments Intact Lisfranc ligament. The collateral ligaments of the metatarsophalangeal joints appear intact. Muscles and Tendons No evidence of acute tendon tear or significant tenosynovitis. Chronic muscular atrophy and T2 hyperintensity attributed to underlying diabetes. Soft tissues Soft tissue ulceration along the dorsal and medial aspects of the great toe with underlying soft tissue swelling and soft tissue emphysema. No focal fluid collections identified. Soft tissue enhancement extends proximally to the level of the metatarsophalangeal joint. IMPRESSION: 1. Soft tissue ulceration along the dorsal and medial aspects of the great toe with underlying soft tissue swelling and soft tissue emphysema. Findings are suspicious for necrotizing soft tissue infection. 2.  Suspected intraosseous extension of gas into the distal phalanx, best seen on the coronal images. No cortical destruction identified to suggest osteomyelitis. 3. No focal fluid collections identified. 4. Chronic muscular atrophy and T2 hyperintensity attributed to underlying diabetes. Electronically Signed   By: Carey Bullocks M.D.   On: 04/18/2023 08:13   [place] EKG SITE RITE Result Date: 04/17/2023 If Site Rite image not attached, placement could not be confirmed due to current cardiac rhythm.  Scheduled Meds:  [MAR Hold] DULoxetine  60 mg Oral Daily   [MAR Hold] gabapentin  100 mg Oral BID   [MAR Hold] insulin aspart  0-15 Units Subcutaneous TID WC   [MAR Hold] lisinopril  40 mg Oral Daily   [MAR Hold] metroNIDAZOLE  500 mg Oral Q12H   [MAR Hold] nicotine  21 mg Transdermal Daily   [MAR Hold] rosuvastatin  20 mg Oral QHS   [MAR Hold] sodium chloride flush  3 mL Intravenous Q12H   [MAR Hold] sodium chloride flush  3 mL Intravenous Q12H   Continuous Infusions:  [MAR Hold] sodium chloride     [MAR Hold] sodium chloride     acetaminophen     albumin human     albumin human     albumin human     [MAR Hold] ceFEPime (MAXIPIME) IV Stopped (04/19/23 8295)   lactated ringers 10 mL/hr at 04/19/23 0854   [MAR Hold] vancomycin 1,000 mg (04/19/23 0650)    LOS: 3 days   Marguerita Merles, DO [facility] Available via Epic secure chat 7am-7pm After these hours, please refer to coverage provider listed on [email or web address] 04/19/2023, 3:59 PM

## 2023-04-19 NOTE — Progress Notes (Signed)
  Day of Surgery Note    Subjective:  in recovery   Vitals:   04/19/23 1435 04/19/23 1445  BP: (!) 72/59 (!) 69/46  Pulse: 100 94  Resp: 18 17  Temp:    SpO2: 91% (!) 87%    Incisions:   left groin with prevena with good seal; left BK incision clean without hematoma Extremities:  brisk left PT/pero/DP; left calf is soft and non tender Cardiac:  regular Lungs:  non labored    Assessment/Plan:  This is a 63 y.o. male who is s/p  Left femoral endarterectomy; left femoral to below knee popliteal bypass with PTFE  -pt with brisk doppler flow left PT/pero/DP and left calf is soft and non tender -prevena to left groin with good seal - no drainage.  Left BK incision is clean without hematoma -blood pressures are soft-will check CBC in pacu -to 4 east later today -hold plavix -pt started on baby asa today.  Hold plavix for now as he is getting toe amputation on Friday with Podiatry.  Will need to restart prior to discharge. -back to 4 east later this afternoon.   Doreatha Massed, PA-C 04/19/2023 3:06 PM [phone]

## 2023-04-20 ENCOUNTER — Encounter (HOSPITAL_COMMUNITY): Payer: Self-pay | Admitting: Surgery

## 2023-04-20 DIAGNOSIS — I2510 Atherosclerotic heart disease of native coronary artery without angina pectoris: Secondary | ICD-10-CM | POA: Diagnosis not present

## 2023-04-20 DIAGNOSIS — E66812 Obesity, class 2: Secondary | ICD-10-CM | POA: Diagnosis not present

## 2023-04-20 DIAGNOSIS — I10 Essential (primary) hypertension: Secondary | ICD-10-CM | POA: Diagnosis not present

## 2023-04-20 DIAGNOSIS — I96 Gangrene, not elsewhere classified: Secondary | ICD-10-CM | POA: Diagnosis not present

## 2023-04-20 DIAGNOSIS — F1410 Cocaine abuse, uncomplicated: Secondary | ICD-10-CM | POA: Diagnosis not present

## 2023-04-20 LAB — IRON AND TIBC
Iron: 26 ug/dL — ABNORMAL LOW (ref 45–182)
Saturation Ratios: 10 % — ABNORMAL LOW (ref 17.9–39.5)
TIBC: 249 ug/dL — ABNORMAL LOW (ref 250–450)
UIBC: 223 ug/dL

## 2023-04-20 LAB — RETICULOCYTES
Immature Retic Fract: 20.1 % — ABNORMAL HIGH (ref 2.3–15.9)
RBC.: 3.97 MIL/uL — ABNORMAL LOW (ref 4.22–5.81)
Retic Count, Absolute: 65.5 10*3/uL (ref 19.0–186.0)
Retic Ct Pct: 1.7 % (ref 0.4–3.1)

## 2023-04-20 LAB — COMPREHENSIVE METABOLIC PANEL
ALT: 22 U/L (ref 0–44)
AST: 21 U/L (ref 15–41)
Albumin: 3.2 g/dL — ABNORMAL LOW (ref 3.5–5.0)
Alkaline Phosphatase: 52 U/L (ref 38–126)
Anion gap: 9 (ref 5–15)
BUN: 12 mg/dL (ref 8–23)
CO2: 23 mmol/L (ref 22–32)
Calcium: 8 mg/dL — ABNORMAL LOW (ref 8.9–10.3)
Chloride: 100 mmol/L (ref 98–111)
Creatinine, Ser: 0.96 mg/dL (ref 0.61–1.24)
GFR, Estimated: >60 mL/min (ref >60)
Glucose, Bld: 136 mg/dL — ABNORMAL HIGH (ref 70–99)
Potassium: 4.2 mmol/L (ref 3.5–5.1)
Sodium: 132 mmol/L — ABNORMAL LOW (ref 135–145)
Total Bilirubin: 0.7 mg/dL (ref 0.0–1.2)
Total Protein: 5.8 g/dL — ABNORMAL LOW (ref 6.5–8.1)

## 2023-04-20 LAB — CBC WITH DIFFERENTIAL/PLATELET
Abs Immature Granulocytes: 0.08 10*3/uL — ABNORMAL HIGH (ref 0.00–0.07)
Basophils Absolute: 0.1 10*3/uL (ref 0.0–0.1)
Basophils Relative: 1 %
Eosinophils Absolute: 0 10*3/uL (ref 0.0–0.5)
Eosinophils Relative: 0 %
HCT: 35 % — ABNORMAL LOW (ref 39.0–52.0)
Hemoglobin: 11.2 g/dL — ABNORMAL LOW (ref 13.0–17.0)
Immature Granulocytes: 1 %
Lymphocytes Relative: 16 %
Lymphs Abs: 1.8 10*3/uL (ref 0.7–4.0)
MCH: 29 pg (ref 26.0–34.0)
MCHC: 32 g/dL (ref 30.0–36.0)
MCV: 90.7 fL (ref 80.0–100.0)
Monocytes Absolute: 1 10*3/uL (ref 0.1–1.0)
Monocytes Relative: 9 %
Neutro Abs: 8.8 10*3/uL — ABNORMAL HIGH (ref 1.7–7.7)
Neutrophils Relative %: 73 %
Platelets: 302 10*3/uL (ref 150–400)
RBC: 3.86 MIL/uL — ABNORMAL LOW (ref 4.22–5.81)
RDW: 13.6 % (ref 11.5–15.5)
WBC: 11.9 10*3/uL — ABNORMAL HIGH (ref 4.0–10.5)
nRBC: 0 % (ref 0.0–0.2)

## 2023-04-20 LAB — POCT I-STAT 7, (LYTES, BLD GAS, ICA,H+H)
Acid-Base Excess: 1 mmol/L (ref 0.0–2.0)
Bicarbonate: 26.3 mmol/L (ref 20.0–28.0)
Calcium, Ion: 1.16 mmol/L (ref 1.15–1.40)
HCT: 43 % (ref 39.0–52.0)
Hemoglobin: 14.6 g/dL (ref 13.0–17.0)
O2 Saturation: 98 %
Patient temperature: 37.5
Potassium: 4.4 mmol/L (ref 3.5–5.1)
Sodium: 139 mmol/L (ref 135–145)
TCO2: 28 mmol/L (ref 22–32)
pCO2 arterial: 45.5 mm[Hg] (ref 32–48)
pH, Arterial: 7.373 (ref 7.35–7.45)
pO2, Arterial: 118 mm[Hg] — ABNORMAL HIGH (ref 83–108)

## 2023-04-20 LAB — LIPID PANEL
Cholesterol: 68 mg/dL (ref 0–200)
HDL: 25 mg/dL — ABNORMAL LOW (ref >40)
LDL Cholesterol: 25 mg/dL (ref 0–99)
Total CHOL/HDL Ratio: 2.7 {ratio}
Triglycerides: 90 mg/dL (ref <150)
VLDL: 18 mg/dL (ref 0–40)

## 2023-04-20 LAB — GLUCOSE, CAPILLARY
Glucose-Capillary: 136 mg/dL — ABNORMAL HIGH (ref 70–99)
Glucose-Capillary: 192 mg/dL — ABNORMAL HIGH (ref 70–99)
Glucose-Capillary: 191 mg/dL — ABNORMAL HIGH (ref 70–99)
Glucose-Capillary: 222 mg/dL — ABNORMAL HIGH (ref 70–99)

## 2023-04-20 LAB — POCT ACTIVATED CLOTTING TIME
Activated Clotting Time: 250 s
Activated Clotting Time: 216 s
Activated Clotting Time: 187 s

## 2023-04-20 LAB — VITAMIN B12: Vitamin B-12: 170 pg/mL — ABNORMAL LOW (ref 180–914)

## 2023-04-20 LAB — FOLATE: Folate: 8 ng/mL (ref >5.9)

## 2023-04-20 LAB — PHOSPHORUS: Phosphorus: 2.7 mg/dL (ref 2.5–4.6)

## 2023-04-20 LAB — FERRITIN: Ferritin: 125 ng/mL (ref 24–336)

## 2023-04-20 LAB — MAGNESIUM: Magnesium: 1.9 mg/dL (ref 1.7–2.4)

## 2023-04-20 MED ORDER — VITAMIN B-12 1000 MCG PO TABS
1000.0000 ug | ORAL_TABLET | Freq: Every day | ORAL | Status: DC
  Administered 2023-04-21 – 2023-04-27 (×7): 1000 ug via ORAL
  Filled 2023-04-20 (×7): qty 1

## 2023-04-20 MED ORDER — KETOROLAC TROMETHAMINE 0.5 % OP SOLN
1.0000 [drp] | Freq: Four times a day (QID) | OPHTHALMIC | Status: AC
  Administered 2023-04-20 – 2023-04-21 (×3): 1 [drp] via OPHTHALMIC
  Filled 2023-04-20: qty 5

## 2023-04-20 MED ORDER — POLYMYXIN B-TRIMETHOPRIM 10000-0.1 UNIT/ML-% OP SOLN
1.0000 [drp] | Freq: Four times a day (QID) | OPHTHALMIC | Status: AC
  Administered 2023-04-20 – 2023-04-21 (×3): 1 [drp] via OPHTHALMIC
  Filled 2023-04-20: qty 10

## 2023-04-20 MED ORDER — BSS IO SOLN
15.0000 mL | Freq: Once | INTRAOCULAR | Status: AC
Start: 2023-04-20 — End: 2023-04-20
  Administered 2023-04-20: 15 mL
  Filled 2023-04-20 (×2): qty 15

## 2023-04-20 MED ORDER — LISINOPRIL 20 MG PO TABS
20.0000 mg | ORAL_TABLET | Freq: Every day | ORAL | Status: DC
  Administered 2023-04-22 – 2023-04-27 (×6): 20 mg via ORAL
  Filled 2023-04-20 (×6): qty 1

## 2023-04-20 MED ORDER — CYANOCOBALAMIN 1000 MCG/ML IJ SOLN
1000.0000 ug | Freq: Once | INTRAMUSCULAR | Status: AC
  Administered 2023-04-20: 1000 ug via INTRAMUSCULAR
  Filled 2023-04-20: qty 1

## 2023-04-20 NOTE — Progress Notes (Signed)
PROGRESS NOTE    Michael Chambers  GLO:[id number] DOB: 12/17/1960 DOA: 04/16/2023 PCP: Kirstie Peri, MD   Brief Narrative:  Michael Chambers is a 63 y.o. male with medical history significant of hypertension, atrial flutter, carotid artery disease status post left endarterectomy, PAD status post Pham pop stenting in 2023, CAD, CRAO, diabetes, cocaine use presenting from [facility] with left great toe gangrene.   **Interim History Vascular surgery and podiatry have been consulted and patient underwent a abdominal aortogram and bilateral lower extremity angiogram which showed bilateral femoral artery occlusion.  Given his occlusion patient was scheduled for a left femoral to below-knee popliteal artery bypass graft and this was done today.  Current plan is for left hallux amputation at the MPJ level in the a.m. as he has been optimized from a vascular standpoint.  Will continue IV antibiotics and may need to discuss with ID for further antibiotic duration and management.  Assessment and Plan:  Left Great Toe Gangrene Diabetic foot infection -MRI left foot - Findings are suspicious for necrotizing soft tissue infection. No cortical destruction identified to suggest osteomyelitis. -Podiatry and vascular surgery follow up appreciated. -He possibly will end up with left hallux amputation. -Vascular Surgery evaluation appreciated and patient underwent a ultrasound-guided right femoral and abdominal aortogram bilateral lower extremity angiogram which showed bilateral femoral artery occlusion; subsequently given his occlusion patient was scheduled for a left femoral to below the knee popliteal artery bypass graft today and is still in the PACU -Continue IV Vancomycin, Cefepime, and Flagyl. -WBC Trend: Recent Labs  Lab 04/17/23 0610 04/18/23 0535 04/19/23 0307 04/19/23 1511 04/19/23 1931 04/20/23 0321  WBC 11.6* 13.0* 15.0* 15.8* 16.1* 11.9*  -Given his softer blood pressures and hypotension  he is getting IV albumin -Continue pain control. -Plan is for total amputation on Friday, 04/21/2023 with podiatry and currently his Clopidogrel is being held   Hypertension -Continued home Lisinopril but changed to Lisinopril 20; upon review of his medications he was taking both lisinopril and losartan; will stop his losartan for now -Was hypotensive for a little while and ended up on on pressors under Anesthesia and subsequently after received IV Albumin; Vascular Surgery obtaining CBC now to check for ABLA -IF BP fails to improve will need Pressors Short term and PCCM Consult -Continue to Monitor blood pressure closely -Last BP reading has improved to 120/84   Atrial Flutter -Heart rate stable. Not on rate/rhythm/nor anticoagulation meds -Continue to Monitor on Telemetry    Carotid Artery Disease -Status post left carotid endarterectomy -Vascular surgery has started the patient on baby aspirin 81 mg p.o. daily today and recommended to continue to hold  Clopidogrel -Continue rosuvastatin 10 mg p.o. nightly   PAD -Status post Left femoral-popliteal angioplasty and stent placement in 2023 and is now status post left femoral endarterectomy and left femoral to below knee popliteal bypass with PTFE -Will follow vascular surgery recommendations. -Clopidogrel being held and patient was to be started on baby aspirin later today; plan is for toe amputation on Friday with podiatry and will need to restart his clopidogrel prior to his discharge   CAD -Home Plavix currently held; resumed Home ASA 81 mg po Daily  -Continue Rosuvastatin 20 mg po at bedtime -Lipid panel done and showed a total cholesterol/HDL ratio of 3.3, cholesterol level 102, HDL 31, LDL 56, triglycerides of 76, VLDL 15  Hyponatremia -Mild. Na+ Trend: Recent Labs  Lab 04/17/23 0610 04/18/23 0535 04/19/23 0307 04/19/23 1228 04/20/23 0321  NA 133*  136 135 139 132*  -Continue to Monitor and Trend and repeat CMP in the AM    Type II Diabetes Mellitus -Blood sugars controlled. -HbA1c was 7.6 -Continue Accu-Cheks and Moderate Novolog Sliding Scale Insulin -Continue to Monitor and Trend CBGs; Current CBG Trend: Recent Labs  Lab 04/19/23 0623 04/19/23 0844 04/19/23 1222 04/19/23 1427 04/19/23 2107 04/20/23 0608 04/20/23 1148  GLUCAP 163* 179* 139* 133* 204* 136* 192*   ABLA/Normocytic Anemia -Hgb/Hct Trend: Recent Labs  Lab 04/17/23 0610 04/18/23 0535 04/19/23 0307 04/19/23 1228 04/19/23 1511 04/19/23 1931 04/20/23 0321  HGB 14.5 14.0 11.7* 14.6 11.7* 11.6* 11.2*  HCT 43.3 42.3 36.2* 43.0 36.6* 36.0* 35.0*  MCV 86.8 87.4 91.0  --  90.6 89.6 90.7  -Checked Anemia Panel and showed an iron level of 26, UIBC of 223, TIBC of 249, saturation of 10%, ferritin level 125, folate level 8.0 and vitamin B12 170 -Will initiate B12 supplementation -Continue to Monitor S/Sx of Bleeding; Repeat CBC pending this Afternoon -Repeat CBC in the AM   Hypoalbuminemia -Patient's Albumin Trend: Recent Labs  Lab 04/17/23 0610 04/19/23 0307 04/20/23 0321  ALBUMIN 2.9* 3.0* 3.2*  -Continue to Monitor and Trend and repeat CMP in the AM  Class II Obesity -Complicates overall prognosis and care -Estimated body mass index is 35.44 kg/m as calculated from the following:   Height as of this encounter: 5\' 9"  (1.753 m).   Weight as of this encounter: 108.9 kg.  -Weight Loss and Dietary Counseling given and Exercise recommended    DVT prophylaxis: heparin injection 5,000 Units Start: 04/20/23 0600 SCD's Start: 04/19/23 1653 SCDs Start: 04/16/23 1723    Code Status: Full Code Family Communication: No family currently at bedside  Disposition Plan:  Level of care: Progressive Cardiac Status is: Inpatient Remains inpatient appropriate because: Needs further clinical improvement and plans for surgical amputation of his left hallux at the MPJ level   Consultants:  Vascular surgery Podiatry he  Procedures:  As  delineated as above  Antimicrobials:  Anti-infectives (From admission, onward)    Start     Dose/Rate Route Frequency Ordered Stop   04/18/23 2200  metroNIDAZOLE (FLAGYL) tablet 500 mg        500 mg Oral Every 12 hours 04/18/23 1336     04/16/23 2000  metroNIDAZOLE (FLAGYL) IVPB 500 mg  Status:  Discontinued        500 mg 100 mL/hr over 60 Minutes Intravenous Every 12 hours 04/16/23 1729 04/18/23 1336   04/16/23 1900  ceFEPIme (MAXIPIME) 2 g in sodium chloride 0.9 % 100 mL IVPB        2 g 200 mL/hr over 30 Minutes Intravenous Every 12 hours 04/16/23 1751     04/16/23 1900  vancomycin (VANCOCIN) IVPB 1000 mg/200 mL premix        1,000 mg 200 mL/hr over 60 Minutes Intravenous Every 12 hours 04/16/23 1751         Subjective: Seen and examined at bedside and he is doing okay and states that the pain is not as bad today.  No nausea or vomiting.  Wanting to know about his antihypertensives and requesting that his blood pressure medication to be reduced.  States that he has been on both an ARB and ACE per his PCP.  No other concerns or complaints at this time.  Objective: Vitals:   04/20/23 0145 04/20/23 0423 04/20/23 0752 04/20/23 1122  BP: 128/78 133/85 125/82 120/84  Pulse: 85 94 100 (!) 107  Resp:  16 17 18 18   Temp: 98.2 F (36.8 C) 98.5 F (36.9 C) 98.4 F (36.9 C) 98.5 F (36.9 C)  TempSrc: Oral Oral Oral Oral  SpO2: 96% 95% 94% 98%  Weight:      Height:        Intake/Output Summary (Last 24 hours) at 04/20/2023 1436 Last data filed at 04/20/2023 1151 Gross per 24 hour  Intake 1076.64 ml  Output 1975 ml  Net -898.36 ml   Filed Weights   04/16/23 1639 04/19/23 0840  Weight: 109.3 kg 108.9 kg   Examination: Physical Exam:  Constitutional: WN/WD obese Caucasian male in no acute distress pple, no cervical masses, normal ROM, no appreciable thyromegaly Respiratory: Diminished to auscultation bilaterally, no wheezing, rales, rhonchi or crackles. Normal respiratory effort  and patient is not tachypenic. No accessory muscle use.  Unlabored breathing Cardiovascular: RRR, no murmurs / rubs / gallops. S1 and S2 auscultated. No extremity edema. Abdomen: Soft, non-tender, distended secondary to body habitus. Bowel sounds positive.  GU: Deferred. Musculoskeletal: No clubbing / cyanosis of digits/nails. No joint deformity upper and lower extremities.  Neurologic: CN 2-12 grossly intact with no focal deficits. Romberg sign and cerebellar reflexes not assessed.  Psychiatric: Normal judgment and insight. Alert and oriented x 3.   Data Reviewed: I have personally reviewed following labs and imaging studies  CBC: Recent Labs  Lab 04/18/23 0535 04/19/23 0307 04/19/23 1228 04/19/23 1511 04/19/23 1931 04/20/23 0321  WBC 13.0* 15.0*  --  15.8* 16.1* 11.9*  NEUTROABS  --  10.7*  --  11.1*  --  8.8*  HGB 14.0 11.7* 14.6 11.7* 11.6* 11.2*  HCT 42.3 36.2* 43.0 36.6* 36.0* 35.0*  MCV 87.4 91.0  --  90.6 89.6 90.7  PLT 347 352  --  349 315 302   Basic Metabolic Panel: Recent Labs  Lab 04/17/23 0610 04/18/23 0535 04/19/23 0307 04/19/23 1228 04/19/23 1931 04/20/23 0321  NA 133* 136 135 139  --  132*  K 3.7 4.1 4.4 4.4  --  4.2  CL 101 99 101  --   --  100  CO2 25 28 22   --   --  23  GLUCOSE 180* 184* 171*  --   --  136*  BUN 7* 9 14  --   --  12  CREATININE 0.84 0.79 0.89  --  0.95 0.96  CALCIUM 8.8* 8.6* 9.1  --   --  8.0*  MG  --   --  2.1  --   --  1.9  PHOS  --   --  3.2  --   --  2.7   GFR: Estimated Creatinine Clearance: 95.8 mL/min (by C-G formula based on SCr of 0.96 mg/dL). Liver Function Tests: Recent Labs  Lab 04/17/23 0610 04/19/23 0307 04/20/23 0321  AST 26 18 21   ALT 27 23 22   ALKPHOS 72 70 52  BILITOT 0.9 0.4 0.7  PROT 6.1* 6.0* 5.8*  ALBUMIN 2.9* 3.0* 3.2*   No results for input(s): "LIPASE", "AMYLASE" in the last 168 hours. No results for input(s): "AMMONIA" in the last 168 hours. Coagulation Profile: No results for input(s):  "INR", "PROTIME" in the last 168 hours. Cardiac Enzymes: No results for input(s): "CKTOTAL", "CKMB", "CKMBINDEX", "TROPONINI" in the last 168 hours. BNP (last 3 results) No results for input(s): "PROBNP" in the last 8760 hours. HbA1C: No results for input(s): "HGBA1C" in the last 72 hours. CBG: Recent Labs  Lab 04/19/23 1222 04/19/23 1427 04/19/23 2107 04/20/23  1660 04/20/23 1148  GLUCAP 139* 133* 204* 136* 192*   Lipid Profile: Recent Labs    04/19/23 0307 04/20/23 0321  CHOL 102 68  HDL 31* 25*  LDLCALC 56 25  TRIG 76 90  CHOLHDL 3.3 2.7   Thyroid Function Tests: No results for input(s): "TSH", "T4TOTAL", "FREET4", "T3FREE", "THYROIDAB" in the last 72 hours. Anemia Panel: Recent Labs    04/20/23 0321  VITAMINB12 170*  FOLATE 8.0  FERRITIN 125  TIBC 249*  IRON 26*  RETICCTPCT 1.7   Sepsis Labs: No results for input(s): "PROCALCITON", "LATICACIDVEN" in the last 168 hours.  Recent Results (from the past 240 hours)  Surgical pcr screen     Status: None   Collection Time: 04/18/23 11:38 PM   Specimen: Nasal Mucosa; Nasal Swab  Result Value Ref Range Status   MRSA, PCR NEGATIVE NEGATIVE Final   Staphylococcus aureus NEGATIVE NEGATIVE Final    Comment: (NOTE) The Xpert SA Assay (FDA approved for NASAL specimens in patients 64 years of age and older), is one component of a comprehensive surveillance program. It is not intended to diagnose infection nor to guide or monitor treatment. Performed at [facility], [address], [place], [place] [address]     Radiology Studies: No results found.  Scheduled Meds:  aspirin EC  81 mg Oral Q0600   cyanocobalamin  1,000 mcg Intramuscular Once   vitamin B-12  1,000 mcg Oral Daily   DULoxetine  60 mg Oral Daily   gabapentin  100 mg Oral BID   heparin  5,000 Units Subcutaneous Q8H   insulin aspart  0-15 Units Subcutaneous TID WC   [START ON 04/21/2023] lisinopril  20 mg Oral Daily   metroNIDAZOLE  500 mg  Oral Q12H   nicotine  21 mg Transdermal Daily   pantoprazole  40 mg Oral Daily   rosuvastatin  20 mg Oral QHS   sodium chloride flush  3 mL Intravenous Q12H   sodium chloride flush  3 mL Intravenous Q12H   Continuous Infusions:  sodium chloride     sodium chloride 75 mL/hr at 04/19/23 1714   ceFEPime (MAXIPIME) IV 2 g (04/20/23 0109)   magnesium sulfate bolus IVPB     vancomycin 1,000 mg (04/20/23 0759)    LOS: 4 days   Marguerita Merles, DO [facility] Available via Epic secure chat 7am-7pm After these hours, please refer to coverage provider listed on [email or web address] 04/20/2023, 2:36 PM
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
## 2023-04-20 NOTE — Plan of Care (Signed)
  Problem: Health Behavior/Discharge Planning: Goal: Ability to manage health-related needs will improve Outcome: Progressing   Problem: Clinical Measurements: Goal: Ability to maintain clinical measurements within normal limits will improve Outcome: Progressing Goal: Will remain free from infection Outcome: Progressing Goal: Diagnostic test results will improve Outcome: Progressing Goal: Respiratory complications will improve Outcome: Progressing Goal: Cardiovascular complication will be avoided Outcome: Progressing   Problem: Activity: Goal: Risk for activity intolerance will decrease Outcome: Progressing   Problem: Nutrition: Goal: Adequate nutrition will be maintained Outcome: Progressing   Problem: Coping: Goal: Level of anxiety will decrease Outcome: Progressing   Problem: Elimination: Goal: Will not experience complications related to bowel motility Outcome: Progressing Goal: Will not experience complications related to urinary retention Outcome: Progressing   Problem: Pain Managment: Goal: General experience of comfort will improve and/or be controlled Outcome: Progressing   Problem: Safety: Goal: Ability to remain free from injury will improve Outcome: Progressing   Problem: Skin Integrity: Goal: Risk for impaired skin integrity will decrease Outcome: Progressing   Problem: Education: Goal: Ability to describe self-care measures that may prevent or decrease complications (Diabetes Survival Skills Education) will improve Outcome: Progressing   Problem: Coping: Goal: Ability to adjust to condition or change in health will improve Outcome: Progressing   Problem: Fluid Volume: Goal: Ability to maintain a balanced intake and output will improve Outcome: Progressing   Problem: Health Behavior/Discharge Planning: Goal: Ability to identify and utilize available resources and services will improve Outcome: Progressing Goal: Ability to manage health-related  needs will improve Outcome: Progressing   Problem: Metabolic: Goal: Ability to maintain appropriate glucose levels will improve Outcome: Progressing   Problem: Nutritional: Goal: Maintenance of adequate nutrition will improve Outcome: Progressing Goal: Progress toward achieving an optimal weight will improve Outcome: Progressing   Problem: Skin Integrity: Goal: Risk for impaired skin integrity will decrease Outcome: Progressing   Problem: Tissue Perfusion: Goal: Adequacy of tissue perfusion will improve Outcome: Progressing   Problem: Education: Goal: Understanding of CV disease, CV risk reduction, and recovery process will improve Outcome: Progressing   Problem: Activity: Goal: Ability to return to baseline activity level will improve Outcome: Progressing   Problem: Cardiovascular: Goal: Ability to achieve and maintain adequate cardiovascular perfusion will improve Outcome: Progressing Goal: Vascular access site(s) Level 0-1 will be maintained Outcome: Progressing   Problem: Health Behavior/Discharge Planning: Goal: Ability to safely manage health-related needs after discharge will improve Outcome: Progressing   Problem: Education: Goal: Knowledge of prescribed regimen will improve Outcome: Progressing   Problem: Activity: Goal: Ability to tolerate increased activity will improve Outcome: Progressing   Problem: Bowel/Gastric: Goal: Gastrointestinal status for postoperative course will improve Outcome: Progressing   Problem: Clinical Measurements: Goal: Postoperative complications will be avoided or minimized Outcome: Progressing Goal: Signs and symptoms of graft occlusion will improve Outcome: Progressing   Problem: Skin Integrity: Goal: Demonstration of wound healing without infection will improve Outcome: Progressing

## 2023-04-20 NOTE — Progress Notes (Signed)
Pharmacy Antibiotic Note  Michael Chambers is a 63 y.o. male admitted on 04/16/2023 with  diabetic foot infection w/ gangrene .  Continues on Vancomycin, Cefepime, and Metronidazole.  Scr stable Amputation planned 1/24  Plan:  Vancomycin 1000mg  IV q12h (eAUC 556, Scr 1.01) -goal AUC 400-600, trough 15-64mcg/mL Cefepime 2g IV q 8h Flagyl per MD   Follow up after OR 1/24 for antibiotic LOT   Height: 5\' 9"  (175.3 cm) Weight: 108.9 kg (240 lb) IBW/kg (Calculated) : 70.7  Temp (24hrs), Avg:98.3 F (36.8 C), Min:97.8 F (36.6 C), Max:98.8 F (37.1 C)  Recent Labs  Lab 04/17/23 0610 04/18/23 0535 04/19/23 0307 04/19/23 1511 04/19/23 1931 04/20/23 0321  WBC 11.6* 13.0* 15.0* 15.8* 16.1* 11.9*  CREATININE 0.84 0.79 0.89  --  0.95 0.96    Estimated Creatinine Clearance: 95.8 mL/min (by C-G formula based on SCr of 0.96 mg/dL).    Allergies  Allergen Reactions   Dimetapp Cold-Allergy [Brompheniramine-Phenylephrine]     hives   Brompheniramine Hives   Chlorpheniramine Hives   Dextromethorphan Hives   Ibuprofen Nausea And Vomiting   Ivp Dye [Iodinated Contrast Media] Itching       Thank you for allowing pharmacy to be a part of this patient's care. Okey Regal, PharmD 04/20/2023 8:33 AM

## 2023-04-20 NOTE — Progress Notes (Signed)
Mobility Specialist Progress Note:    04/20/23 1130  Mobility  Activity Ambulated with assistance in hallway  Level of Assistance Contact guard assist, steadying assist  Assistive Device Front wheel walker  Distance Ambulated (ft) 200 ft  Activity Response Tolerated well  Mobility Referral Yes  Mobility visit 1 Mobility  Mobility Specialist Start Time (ACUTE ONLY) 1100  Mobility Specialist Stop Time (ACUTE ONLY) 1115  Mobility Specialist Time Calculation (min) (ACUTE ONLY) 15 min   Received pt in bed having no complaints and agreeable to mobility. Pt was asymptomatic throughout ambulation and returned to room w/o fault. Left seated EOB w/ call bell in reach and all needs met.   Thompson Grayer Mobility Specialist  Please contact vis Secure Chat or  Rehab Office [phone]

## 2023-04-20 NOTE — Progress Notes (Signed)
At bedside report, patient had bright red blood at left radial A-line site and in bed. Upon assessment, bio patch was completely soaked in blood, A-line was completely out but bleeding had stopped. No hematoma noted, left radial pulse 2+, site cleaned, pressure held, covered with gauze and tape dressing. Instructed pt to call if he notices any blood at the site. Pt voiced understanding. Call bell within reach. Plan of care continues.

## 2023-04-20 NOTE — Progress Notes (Signed)
PODIATRY PROGRESS NOTE [name]  DOB 1960-08-24 DOA 04/16/2023  Hospital Day: 5  Assessment:  63 y.o. male with PMHx significant for  hypertension, atrial flutter, carotid artery disease status post left endarterectomy, PAD status post fem-pop stenting in 2023, CAD, CRAO, DM2 w/ neuropathy, cocaine use  with Left hallux gangrene, underlying osteomyelitis.   AF, VSS  WBC: 11.9  Wound/Bone Cultures: pending OR  Imaging: MRI L foot W  WO contrast: 1. Soft tissue ulceration along the dorsal and medial aspects of the great toe with underlying soft tissue swelling and soft tissue emphysema. Findings are suspicious for necrotizing soft tissue infection. 2. Suspected intraosseous extension of gas into the distal phalanx, best seen on the coronal images. No cortical destruction identified to suggest osteomyelitis.  Plan:  - NPO past MN for OR tomorrow L hallux amp at MPJ level 0730. Consent obtained.  - Appreciate vascular surgery, discussed with Dr. Myra Gianotti, successful bypass LLE, optimized for amp - Ok to continue anticoagulation prior to procedure, no need to hold prior to OR tmrw - Continue abx IV per primary / vascular recs Will continue to follow        Corinna Gab, DPM [facility]    Subjective:  Discussed plan for OR tomorrow for left great toe amputation and pt is in agreement. All questions answered.   Objective:   Vitals:   04/20/23 0423 04/20/23 0752  BP: 133/85 125/82  Pulse: 94 100  Resp: 17 18  Temp: 98.5 F (36.9 C) 98.4 F (36.9 C)  SpO2: 95% 94%       Latest Ref Rng & Units 04/20/2023    3:21 AM 04/19/2023    7:31 PM 04/19/2023    3:11 PM  CBC  WBC 4.0 - 10.5 K/uL 11.9  16.1  15.8   Hemoglobin 13.0 - 17.0 g/dL 65.7  84.6  96.2   Hematocrit 39.0 - 52.0 % 35.0  36.0  36.6   Platelets 150 - 400 K/uL 302  315  349        Latest Ref Rng & Units 04/20/2023    3:21 AM 04/19/2023    7:31 PM 04/19/2023   12:28 PM  BMP   Glucose 70 - 99 mg/dL 952     BUN 8 - 23 mg/dL 12     Creatinine 8.41 - 1.24 mg/dL 3.24  4.01    Sodium 027 - 145 mmol/L 132   139   Potassium 3.5 - 5.1 mmol/L 4.2   4.4   Chloride 98 - 111 mmol/L 100     CO2 22 - 32 mmol/L 23     Calcium 8.9 - 10.3 mg/dL 8.0       General: AAOx3, NAD  Lower Extremity Exam Vasc:                    L - DP and PT palpable   Derm:    R - Normal temp/texture/turgor with no open lesion or clinical signs of infection                 L -  Gangrene of the left hallux. Dressing intact though erythema noted about the midfoot as well as edema. Malodor present.  MSK:     R -  No gross deformities. Compartments soft, non-tender, compressible               L - Edema and gangrene present   Neuro:R -  Gross sensation diminished. Gross motor function intact                 L - Gross sensation diminished. Gross motor function intact   Radiology:  Results reviewed. See assessment for pertinent imaging results
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
## 2023-04-20 NOTE — Addendum Note (Signed)
Addendum  created 04/20/23 1924 by [place] Nation, MD   Clinical Note Signed

## 2023-04-20 NOTE — Progress Notes (Addendum)
  Progress Note    04/20/2023 6:42 AM 1 Day Post-Op  Subjective:  says his leg is sore; wants to get out of bed  Afebrile   Vitals:   04/20/23 0145 04/20/23 0423  BP: 128/78 133/85  Pulse: 85 94  Resp: 16 17  Temp: 98.2 F (36.8 C) 98.5 F (36.9 C)  SpO2: 96% 95%    Physical Exam: General:  no distress Cardiac:  regular Lungs:  non labored Incisions:  left groin with Prevena with good seal; left BK incision looks good; calf is soft.  Extremities:  brisk left DP/PT/pero; calf soft. left great toe wrapped, malodorous Abdomen:  soft  CBC    Component Value Date/Time   WBC 11.9 (H) 04/20/2023 0321   RBC 3.86 (L) 04/20/2023 0321   RBC 3.97 (L) 04/20/2023 0321   HGB 11.2 (L) 04/20/2023 0321   HCT 35.0 (L) 04/20/2023 0321   PLT 302 04/20/2023 0321   MCV 90.7 04/20/2023 0321   MCH 29.0 04/20/2023 0321   MCHC 32.0 04/20/2023 0321   RDW 13.6 04/20/2023 0321   LYMPHSABS 1.8 04/20/2023 0321   MONOABS 1.0 04/20/2023 0321   EOSABS 0.0 04/20/2023 0321   BASOSABS 0.1 04/20/2023 0321    BMET    Component Value Date/Time   NA 132 (L) 04/20/2023 0321   K 4.2 04/20/2023 0321   CL 100 04/20/2023 0321   CO2 23 04/20/2023 0321   GLUCOSE 136 (H) 04/20/2023 0321   BUN 12 04/20/2023 0321   CREATININE 0.96 04/20/2023 0321   CALCIUM 8.0 (L) 04/20/2023 0321   GFRNONAA >60 04/20/2023 0321   GFRAA >60 03/16/2016 0859    INR    Component Value Date/Time   INR 1.0 05/31/2021 1117     Intake/Output Summary (Last 24 hours) at 04/20/2023 2440 Last data filed at 04/20/2023 0425 Gross per 24 hour  Intake 2556.64 ml  Output 2490 ml  Net 66.64 ml      Assessment/Plan:  63 y.o. male is s/p:  Left femoral to TPT bypass with 6mm external ringed PTFE for left foot gangrene and incisional prevena vac placement left groin  1 Day Post-Op   -pt with brisk doppler flow left foot.   -Left groin with prevena with good seal-discussed with pt that this will stay on for ~ 7 days.   Discussed groin wound care when this is removed.  Left BK incision is clean without hematoma.  Calf soft.  -discussed with pt that he has artificial bypass.  He states that he has his vein removed in 1986 for bypass in right arm.  -discussed with pt about importance of smoking cessation especially in light of having PTFE bypass and being diabetic.   -acute surgical blood loss anemia is stable from yesterday afternoon.  Blood pressure improved from yesterday post op -DVT prophylaxis:  sq heparin to start this am.   -continue to hold plavix and plan for OR tomorrow with podiatry    Doreatha Massed, PA-C [facility] [phone] 04/20/2023 6:42 AM  I agree with the above.  I have seen and evaluate the patient.  He is postoperative day #1, status post left femoral to below-knee popliteal bypass graft with PTFE.  He is scheduled for toe amputation tomorrow with podiatry.  He has excellent signals today.  His incisions are healing nicely.  I will need to have him discharged on Plavix, but will not start this until after his toe amputation.  Durene Cal

## 2023-04-20 NOTE — Anesthesia Postprocedure Evaluation (Signed)
Anesthesia Post Note  Patient: Michael Chambers  Procedure(s) Performed: LEFT FEMORAL-POPLITEAL ARTERY BYPASS WITH VEIN USING 6 MM GORE PROPATEN VASCULAR REMOVABLE RING GRAFT (Left: Leg Lower)     Patient location during evaluation: Other (4 East Floor bed) Anesthesia Type: General Level of consciousness: awake and alert and awake Pain management: pain level controlled Vital Signs Assessment: post-procedure vital signs reviewed and stable Respiratory status: spontaneous breathing Anesthetic complications: yes Comments: Patient reevaluated at bedside post op day 1 following complaints of right eye pain.    -Endorsing ongoing right eye pain that feels like "something is in there". Reports associated blurry vision. He also admits to significant improvement since initially arriving to the floor after surgery yesterday. On physical exam, some corneal irritation was noted on the lateral portion of the right eye. Vision remains intact.   Plan for abx and analgesic eye drops. Ophthalmology consult should symptoms worsen.      Encounter Notable Events  Notable Event Outcome Phase Comment  Corneal abrasion  In Recovery Right eye    Last Vitals:  Vitals:   04/20/23 1122 04/20/23 1653  BP: 120/84 132/85  Pulse: (!) 107 96  Resp: 18 20  Temp: 36.9 C 36.8 C  SpO2: 98% 97%    Last Pain:  Vitals:   04/20/23 1900  TempSrc:   PainSc: 7                  [place]

## 2023-04-21 ENCOUNTER — Inpatient Hospital Stay (HOSPITAL_COMMUNITY): Payer: Medicare HMO

## 2023-04-21 ENCOUNTER — Encounter (HOSPITAL_COMMUNITY)
Admission: AD | Disposition: A | Discharge status: Home Health | Payer: Self-pay | Source: Other Acute Inpatient Hospital | Attending: Internal Medicine

## 2023-04-21 ENCOUNTER — Other Ambulatory Visit: Payer: Self-pay

## 2023-04-21 ENCOUNTER — Encounter (HOSPITAL_COMMUNITY): Payer: Self-pay | Admitting: Internal Medicine

## 2023-04-21 DIAGNOSIS — I96 Gangrene, not elsewhere classified: Secondary | ICD-10-CM | POA: Diagnosis not present

## 2023-04-21 DIAGNOSIS — J449 Chronic obstructive pulmonary disease, unspecified: Secondary | ICD-10-CM | POA: Diagnosis not present

## 2023-04-21 DIAGNOSIS — F17210 Nicotine dependence, cigarettes, uncomplicated: Secondary | ICD-10-CM | POA: Diagnosis not present

## 2023-04-21 DIAGNOSIS — I10 Essential (primary) hypertension: Secondary | ICD-10-CM

## 2023-04-21 DIAGNOSIS — E66812 Obesity, class 2: Secondary | ICD-10-CM | POA: Diagnosis not present

## 2023-04-21 DIAGNOSIS — I2510 Atherosclerotic heart disease of native coronary artery without angina pectoris: Secondary | ICD-10-CM | POA: Diagnosis not present

## 2023-04-21 DIAGNOSIS — F1410 Cocaine abuse, uncomplicated: Secondary | ICD-10-CM | POA: Diagnosis not present

## 2023-04-21 HISTORY — PX: AMPUTATION: SHX166

## 2023-04-21 LAB — COMPREHENSIVE METABOLIC PANEL
ALT: 19 U/L (ref 0–44)
AST: 17 U/L (ref 15–41)
Albumin: 3 g/dL — ABNORMAL LOW (ref 3.5–5.0)
Alkaline Phosphatase: 55 U/L (ref 38–126)
Anion gap: 11 (ref 5–15)
BUN: 10 mg/dL (ref 8–23)
CO2: 23 mmol/L (ref 22–32)
Calcium: 8.7 mg/dL — ABNORMAL LOW (ref 8.9–10.3)
Chloride: 100 mmol/L (ref 98–111)
Creatinine, Ser: 0.99 mg/dL (ref 0.61–1.24)
GFR, Estimated: >60 mL/min (ref >60)
Glucose, Bld: 178 mg/dL — ABNORMAL HIGH (ref 70–99)
Potassium: 4.1 mmol/L (ref 3.5–5.1)
Sodium: 134 mmol/L — ABNORMAL LOW (ref 135–145)
Total Bilirubin: 0.7 mg/dL (ref 0.0–1.2)
Total Protein: 5.8 g/dL — ABNORMAL LOW (ref 6.5–8.1)

## 2023-04-21 LAB — CBC WITH DIFFERENTIAL/PLATELET
Abs Immature Granulocytes: 0.07 10*3/uL (ref 0.00–0.07)
Basophils Absolute: 0 10*3/uL (ref 0.0–0.1)
Basophils Relative: 0 %
Eosinophils Absolute: 0.1 10*3/uL (ref 0.0–0.5)
Eosinophils Relative: 0 %
HCT: 35.5 % — ABNORMAL LOW (ref 39.0–52.0)
Hemoglobin: 11.8 g/dL — ABNORMAL LOW (ref 13.0–17.0)
Immature Granulocytes: 1 %
Lymphocytes Relative: 15 %
Lymphs Abs: 1.9 10*3/uL (ref 0.7–4.0)
MCH: 29.1 pg (ref 26.0–34.0)
MCHC: 33.2 g/dL (ref 30.0–36.0)
MCV: 87.4 fL (ref 80.0–100.0)
Monocytes Absolute: 1.3 10*3/uL — ABNORMAL HIGH (ref 0.1–1.0)
Monocytes Relative: 10 %
Neutro Abs: 9.5 10*3/uL — ABNORMAL HIGH (ref 1.7–7.7)
Neutrophils Relative %: 74 %
Platelets: 289 10*3/uL (ref 150–400)
RBC: 4.06 MIL/uL — ABNORMAL LOW (ref 4.22–5.81)
RDW: 13.3 % (ref 11.5–15.5)
WBC: 12.9 10*3/uL — ABNORMAL HIGH (ref 4.0–10.5)
nRBC: 0 % (ref 0.0–0.2)

## 2023-04-21 LAB — GLUCOSE, CAPILLARY
Glucose-Capillary: 200 mg/dL — ABNORMAL HIGH (ref 70–99)
Glucose-Capillary: 187 mg/dL — ABNORMAL HIGH (ref 70–99)
Glucose-Capillary: 173 mg/dL — ABNORMAL HIGH (ref 70–99)
Glucose-Capillary: 160 mg/dL — ABNORMAL HIGH (ref 70–99)
Glucose-Capillary: 214 mg/dL — ABNORMAL HIGH (ref 70–99)

## 2023-04-21 LAB — MAGNESIUM: Magnesium: 1.9 mg/dL (ref 1.7–2.4)

## 2023-04-21 LAB — PHOSPHORUS: Phosphorus: 2.2 mg/dL — ABNORMAL LOW (ref 2.5–4.6)

## 2023-04-21 SURGERY — AMPUTATION, FOOT, RAY
Anesthesia: Monitor Anesthesia Care | Site: First Toe | Laterality: Left

## 2023-04-21 MED ORDER — ALBUMIN HUMAN 5 % IV SOLN
INTRAVENOUS | Status: AC
  Filled 2023-04-21: qty 250

## 2023-04-21 MED ORDER — CLOPIDOGREL BISULFATE 75 MG PO TABS
75.0000 mg | ORAL_TABLET | Freq: Every day | ORAL | Status: DC
  Administered 2023-04-21 – 2023-04-27 (×7): 75 mg via ORAL
  Filled 2023-04-21 (×7): qty 1

## 2023-04-21 MED ORDER — FENTANYL CITRATE (PF) 250 MCG/5ML IJ SOLN
INTRAMUSCULAR | Status: DC | PRN
  Administered 2023-04-21: 50 ug via INTRAVENOUS

## 2023-04-21 MED ORDER — ORAL CARE MOUTH RINSE
15.0000 mL | Freq: Once | OROMUCOSAL | Status: AC
  Administered 2023-04-21: 15 mL via OROMUCOSAL

## 2023-04-21 MED ORDER — BUPIVACAINE HCL (PF) 0.5 % IJ SOLN
INTRAMUSCULAR | Status: AC
  Filled 2023-04-21: qty 30

## 2023-04-21 MED ORDER — ONDANSETRON HCL 4 MG/2ML IJ SOLN
INTRAMUSCULAR | Status: DC | PRN
  Administered 2023-04-21: 4 mg via INTRAVENOUS

## 2023-04-21 MED ORDER — ALBUMIN HUMAN 5 % IV SOLN
12.5000 g | Freq: Once | INTRAVENOUS | Status: AC
  Administered 2023-04-21: 12.5 g via INTRAVENOUS

## 2023-04-21 MED ORDER — PHENYLEPHRINE HCL-NACL 20-0.9 MG/250ML-% IV SOLN
INTRAVENOUS | Status: DC | PRN
  Administered 2023-04-21: 35 ug/min via INTRAVENOUS

## 2023-04-21 MED ORDER — OXYCODONE HCL 5 MG PO TABS: 5.0000 mg | ORAL_TABLET | Freq: Once | ORAL | Status: DC | PRN

## 2023-04-21 MED ORDER — LACTATED RINGERS IV SOLN: INTRAVENOUS | Status: DC

## 2023-04-21 MED ORDER — MIDAZOLAM HCL 2 MG/2ML IJ SOLN
INTRAMUSCULAR | Status: DC | PRN
  Administered 2023-04-21: 2 mg via INTRAVENOUS

## 2023-04-21 MED ORDER — SODIUM PHOSPHATES 45 MMOLE/15ML IV SOLN
15.0000 mmol | Freq: Once | INTRAVENOUS | Status: AC
  Administered 2023-04-21: 15 mmol via INTRAVENOUS
  Filled 2023-04-21 (×2): qty 5

## 2023-04-21 MED ORDER — SODIUM CHLORIDE 0.9 % IV SOLN
2.0000 g | Freq: Three times a day (TID) | INTRAVENOUS | Status: DC
  Administered 2023-04-21 – 2023-04-27 (×18): 2 g via INTRAVENOUS
  Filled 2023-04-21 (×17): qty 12.5

## 2023-04-21 MED ORDER — LIDOCAINE HCL (PF) 1 % IJ SOLN: INTRAMUSCULAR | Status: DC | PRN

## 2023-04-21 MED ORDER — PROPOFOL 500 MG/50ML IV EMUL
INTRAVENOUS | Status: DC | PRN
  Administered 2023-04-21: 75 ug/kg/min via INTRAVENOUS

## 2023-04-21 MED ORDER — PROPOFOL 10 MG/ML IV BOLUS
INTRAVENOUS | Status: DC | PRN
  Administered 2023-04-21: 30 mg via INTRAVENOUS

## 2023-04-21 MED ORDER — 0.9 % SODIUM CHLORIDE (POUR BTL) OPTIME
TOPICAL | Status: DC | PRN
  Administered 2023-04-21: 1000 mL

## 2023-04-21 MED ORDER — CHLORHEXIDINE GLUCONATE 0.12 % MT SOLN: 15.0000 mL | Freq: Once | OROMUCOSAL | Status: AC

## 2023-04-21 MED ORDER — MIDAZOLAM HCL 2 MG/2ML IJ SOLN
INTRAMUSCULAR | Status: AC
  Filled 2023-04-21: qty 2

## 2023-04-21 MED ORDER — ACETAMINOPHEN 500 MG PO TABS
1000.0000 mg | ORAL_TABLET | Freq: Once | ORAL | Status: AC
  Administered 2023-04-21: 1000 mg via ORAL
  Filled 2023-04-21: qty 2

## 2023-04-21 MED ORDER — ONDANSETRON HCL 4 MG/2ML IJ SOLN
4.0000 mg | Freq: Once | INTRAMUSCULAR | Status: DC | PRN
Start: 2023-04-21 — End: 2023-04-21

## 2023-04-21 MED ORDER — PROPOFOL 10 MG/ML IV BOLUS
INTRAVENOUS | Status: AC
  Filled 2023-04-21: qty 20

## 2023-04-21 MED ORDER — SODIUM CHLORIDE 0.9 % IV SOLN: INTRAVENOUS | Status: DC | PRN

## 2023-04-21 MED ORDER — HYDROMORPHONE HCL 1 MG/ML IJ SOLN
0.2500 mg | INTRAMUSCULAR | Status: DC | PRN
Start: 2023-04-21 — End: 2023-04-21

## 2023-04-21 MED ORDER — ACETAMINOPHEN 500 MG PO TABS: 1000.0000 mg | ORAL_TABLET | Freq: Once | ORAL | Status: AC

## 2023-04-21 MED ORDER — OXYCODONE HCL 5 MG/5ML PO SOLN
5.0000 mg | Freq: Once | ORAL | Status: DC | PRN
Start: 2023-04-21 — End: 2023-04-21

## 2023-04-21 MED ORDER — FENTANYL CITRATE (PF) 250 MCG/5ML IJ SOLN
INTRAMUSCULAR | Status: AC
  Filled 2023-04-21: qty 5

## 2023-04-21 SURGICAL SUPPLY — 32 items
BLADE OSC/SAGITTAL MD 5.5X18 (BLADE) ×2 IMPLANT
BLADE SURG 10 STRL SS (BLADE) ×2 IMPLANT
BLADE SURG 15 STRL LF DISP TIS (BLADE) ×4 IMPLANT
BNDG COHESIVE 3X5 TAN ST LF (GAUZE/BANDAGES/DRESSINGS) ×2 IMPLANT
BNDG ELASTIC 3INX 5YD STR LF (GAUZE/BANDAGES/DRESSINGS) ×2 IMPLANT
BNDG ELASTIC 4INX 5YD STR LF (GAUZE/BANDAGES/DRESSINGS) IMPLANT
BNDG ESMARK 4X9 LF (GAUZE/BANDAGES/DRESSINGS) ×2 IMPLANT
CHLORAPREP W/TINT 26 (MISCELLANEOUS) ×2 IMPLANT
ELECT REM PT RETURN 9FT ADLT (ELECTROSURGICAL) ×2 IMPLANT
ELECTRODE REM PT RTRN 9FT ADLT (ELECTROSURGICAL) ×2 IMPLANT
GAUZE SPONGE 2X2 STRL 8-PLY (GAUZE/BANDAGES/DRESSINGS) ×4 IMPLANT
GAUZE SPONGE 4X4 12PLY STRL (GAUZE/BANDAGES/DRESSINGS) ×2 IMPLANT
GAUZE STRETCH 2X75IN STRL (MISCELLANEOUS) ×2 IMPLANT
GAUZE XEROFORM 1X8 LF (GAUZE/BANDAGES/DRESSINGS) ×2 IMPLANT
GLOVE BIO SURGEON STRL SZ7.5 (GLOVE) ×2 IMPLANT
GLOVE BIOGEL PI IND STRL 7.5 (GLOVE) ×2 IMPLANT
GOWN STRL REUS W/ TWL LRG LVL3 (GOWN DISPOSABLE) ×4 IMPLANT
KIT BASIN OR (CUSTOM PROCEDURE TRAY) ×2 IMPLANT
NDL BIOPSY JAMSHIDI 8X6 (NEEDLE) IMPLANT
NDL HYPO 25X1 1.5 SAFETY (NEEDLE) ×2 IMPLANT
NEEDLE BIOPSY JAMSHIDI 8X6 (NEEDLE) IMPLANT
NEEDLE HYPO 25X1 1.5 SAFETY (NEEDLE) ×4 IMPLANT
PACK ORTHO EXTREMITY (CUSTOM PROCEDURE TRAY) ×2 IMPLANT
PADDING CAST ABS COTTON 4X4 ST (CAST SUPPLIES) ×4 IMPLANT
SPIKE FLUID TRANSFER (MISCELLANEOUS) ×4 IMPLANT
STAPLER VISISTAT 35W (STAPLE) IMPLANT
STOCKINETTE 4X48 STRL (DRAPES) ×2 IMPLANT
SUT ETHILON 3 0 FSLX (SUTURE) ×2 IMPLANT
SUT PROLENE 4 0 PS 2 18 (SUTURE) ×4 IMPLANT
SYR CONTROL 10ML LL (SYRINGE) ×4 IMPLANT
UNDERPAD 30X36 HEAVY ABSORB (UNDERPADS AND DIAPERS) ×2 IMPLANT
WATER STERILE IRR 1000ML POUR (IV SOLUTION) ×2 IMPLANT

## 2023-04-21 NOTE — Progress Notes (Signed)
    Subjective  - POD # 2, status post left femoral to tibioperoneal trunk bypass graft  He underwent toe amputation today by Dr. Annamary Rummage who reported good bleeding at the amputation site.   Physical Exam:  Incisions are healing appropriately Gross pedal Doppler signals       Assessment/Plan:  POD #1  -We will reinitiate Plavix today. -Will need mobilization to determine ultimate disposition  Michael Chambers 04/21/2023 7:07 PM --  Vitals:   04/21/23 1554 04/21/23 1636  BP:  (!) 155/95  Pulse: 98 90  Resp:  20  Temp:  98 F (36.7 C)  SpO2:  95%    Intake/Output Summary (Last 24 hours) at 04/21/2023 1907 Last data filed at 04/21/2023 6440 Gross per 24 hour  Intake 1540 ml  Output 550 ml  Net 990 ml     Laboratory CBC    Component Value Date/Time   WBC 12.9 (H) 04/21/2023 0312   HGB 11.8 (L) 04/21/2023 0312   HCT 35.5 (L) 04/21/2023 0312   PLT 289 04/21/2023 0312    BMET    Component Value Date/Time   NA 134 (L) 04/21/2023 0312   K 4.1 04/21/2023 0312   CL 100 04/21/2023 0312   CO2 23 04/21/2023 0312   GLUCOSE 178 (H) 04/21/2023 0312   BUN 10 04/21/2023 0312   CREATININE 0.99 04/21/2023 0312   CALCIUM 8.7 (L) 04/21/2023 0312   GFRNONAA >60 04/21/2023 0312   GFRAA >60 03/16/2016 0859    COAG Lab Results  Component Value Date   INR 1.0 05/31/2021   INR 1.0 03/22/2007   No results found for: "PTT"  Antibiotics Anti-infectives (From admission, onward)    Start     Dose/Rate Route Frequency Ordered Stop   04/21/23 1515  ceFEPIme (MAXIPIME) 2 g in sodium chloride 0.9 % 100 mL IVPB        2 g 200 mL/hr over 30 Minutes Intravenous Every 8 hours 04/21/23 1503     04/18/23 2200  metroNIDAZOLE (FLAGYL) tablet 500 mg        500 mg Oral Every 12 hours 04/18/23 1336     04/16/23 2000  metroNIDAZOLE (FLAGYL) IVPB 500 mg  Status:  Discontinued        500 mg 100 mL/hr over 60 Minutes Intravenous Every 12 hours 04/16/23 1729 04/18/23 1336   04/16/23  1900  ceFEPIme (MAXIPIME) 2 g in sodium chloride 0.9 % 100 mL IVPB  Status:  Discontinued        2 g 200 mL/hr over 30 Minutes Intravenous Every 12 hours 04/16/23 1751 04/21/23 1503   04/16/23 1900  vancomycin (VANCOCIN) IVPB 1000 mg/200 mL premix        1,000 mg 200 mL/hr over 60 Minutes Intravenous Every 12 hours 04/16/23 1751          V. Charlena Cross, M.D., [facility] [place] Office: [phone] Pager:  [phone]

## 2023-04-21 NOTE — Op Note (Signed)
Full Operative Report  Date of Operation: 7:16 AM, 04/21/2023   Patient: Michael Chambers - 63 y.o. male  Surgeon: Pilar Plate, DPM   Assistant: None  Diagnosis: Gangrene of great toe left foot  Procedure:  1. Partial first ray amputation, left foot    Anesthesia: Anesthesia type not filed in the log.  No responsible provider has been recorded for the case.  No anesthesia staff entered.   Estimated Blood Loss: Minimal  Hemostasis: 1) Anatomical dissection, mechanical compression, electrocautery 2) No tourniquet was used during procedure  Implants: * No implants in log *  Materials: Prolene 3-0  Injectables: 1) Pre-operatively: 10 cc of 0.5% marcaine plain 2) Post-operatively: None   Specimens: Pathology: left hallux   Microbiology: tissue for culture left hallux   Antibiotics: IV antibiotics given per schedule on the floor  Drains: None  Complications: Patient tolerated the procedure well without complication.   Operative findings: As below in detailed report  Indications for Procedure: Michael Chambers presents to Pilar Plate, DPM with a chief complaint of gangrene of the left hallux. S/p LLE bypass this admission. The patient has failed conservative treatments of various modalities. At this time the patient has elected to proceed with surgical correction. All alternatives, risks, and complications of the procedures were thoroughly explained to the patient. Patient exhibits appropriate understanding of all discussion points and informed consent was signed and obtained in the chart with no guarantees to surgical outcome given or implied.  Description of Procedure: Patient was brought to the operating room. Patient remained on their hospital bed in the supine position. A surgical timeout was performed and all members of the operating room, the procedure, and the surgical site were identified. anesthesia occurred as per anesthesia record. Local  anesthetic as previously described was then injected about the operative field in a local infiltrative block.  The operative lower extremity as noted above was then prepped and draped in the usual sterile manner. The following procedure then began.  Attention was directed to the left lower extremity. A racquet type incision was made over the dorsal medial aspect of the 1st metatarsal, including the entire digit. A full thickness incision was made down to bone using a #15 blade. The incision was continued through the soft tissue down to the shaft of the 1st metatarsal. No purulent material was seen.  Using a #15 blade, the digit was then disarticulated in its entirety at the metatarsophalangeal joint and freed of all soft tissue attachments. The specimen was passed off the field and sent for gross pathology. A tissue culture was obtained with rongeur and sent for micro. Next, a key elevator was then used to free up the periosteum on the metatarsal shaft. Using a sagittal saw, the metatarsal was cut in a dorsal distal to plantar proximal orientation and beveled so that the medial cortex was shorter than the lateral. The bone was passed off the field and sent as a gross specimen.  All remaining non-viable and necrotic tissues were sharply resected and removed. Extensor and flexors tendons were grasped with a hemostat and cut proximally. The surgical site was then flushed with of saline..  The skin flap was then approximated and partially closed proximally under minimal tension with 3-0 prolene.   The surgical site was then dressed with xeroform 4x4 kerlix ace wrap. The patient tolerated both the procedure and anesthesia well with vital signs stable throughout. The patient was transferred in good condition and all vital signs  stable  from the OR to recovery under the discretion of anesthesia.  Condition: Vital signs stable, neurovascular status unchanged from preoperative   Surgical plan:  Expect clean  surgical margin.  Was required to perform  partial first ray amputation for closure purposes due to extensive tissue necrosis to the lateral base of the great toe. Good bleeding noted during procedure. WBAT in post op shoe for short distance/transfers. Recommend 10 days abx on discharge. Ok for Augmentin. Leave dressing clean dry intact. Follow up 7- 10 days  the patient will be WBAT in a post op shoe to the operative limb until further instructed. The dressing is to remain clean, dry, and intact. Will continue to follow unless noted elsewhere.   Michael Chambers, DPM [facility]

## 2023-04-21 NOTE — Progress Notes (Signed)
PROGRESS NOTE    [name]  BJY:[id number] DOB: September 01, 1960 DOA: 04/16/2023 PCP: Kirstie Peri, MD   Brief Narrative:  Michael Chambers is a 63 y.o. male with medical history significant of hypertension, atrial flutter, carotid artery disease status post left endarterectomy, PAD status post Pham pop stenting in 2023, CAD, CRAO, diabetes, cocaine use presenting from [facility] with left great toe gangrene.   **Interim History Vascular surgery and podiatry have been consulted and patient underwent a abdominal aortogram and bilateral lower extremity angiogram which showed bilateral femoral artery occlusion.  Given his occlusion patient was scheduled for a left femoral to below-knee popliteal artery bypass graft and this was done today.  Current plan is for left hallux amputation at the MPJ level in the a.m. as he has been optimized from a vascular standpoint.  Will continue IV antibiotics and may need to discuss with ID for further antibiotic duration and management.  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 No cortical destruction identified to suggest osteomyelitis. -Podiatry and vascular surgery follow up appreciated. -He possibly will end up with left hallux amputation. -Vascular Surgery evaluation appreciated and patient underwent a ultrasound-guided right femoral and abdominal aortogram bilateral lower extremity angiogram which showed bilateral femoral artery occlusion; subsequently given his occlusion patient was scheduled for a left femoral to below the knee popliteal artery bypass graft today and is still in the PACU -Continue IV Vancomycin, Cefepime, and Flagyl. -WBC Trend: Recent Labs  Lab 04/17/23 0610 04/18/23 0535 04/19/23 0307 04/19/23 1511 04/19/23 1931 04/20/23 0321 04/21/23 0312  WBC 11.6* 13.0* 15.0* 15.8* 16.1* 11.9*  12.9*  -Given his softer blood pressures and hypotension he is getting IV albumin -Continue Pain Control. -Plan is for total amputation on Friday, 04/21/2023 with podiatry and currently his Clopidogrel is being held -Aerobic/Anaerobic Cx sent and showing "ABUNDANT GRAM POSITIVE COCCI IN PAIRS  FEW GRAM NEGATIVE RODS" -PT OT recommending Home Health   Hypertension -Continued home Lisinopril but changed to Lisinopril 20; upon review of his medications he was taking both lisinopril and losartan; will stop his losartan for now -Was hypotensive for a little while and ended up on on pressors under Anesthesia and subsequently after received IV Albumin; Vascular Surgery obtaining CBC now to check for ABLA -IF BP fails to improve will need Pressors Short term and PCCM Consult -Continue to Monitor blood pressure closely -Last BP reading has improved to 129/82   Atrial Flutter -Heart rate stable. Not on rate/rhythm/nor anticoagulation meds -Continue to Monitor on Telemetry    Carotid Artery Disease -Status post left carotid endarterectomy -Vascular surgery has started the patient on baby aspirin 81 mg p.o. daily today and recommended to continue to hold  Clopidogrel -Continue rosuvastatin 10 mg p.o. nightly   PAD -Status post Left femoral-popliteal angioplasty and stent placement in 2023 and is now status post left femoral endarterectomy and left femoral to below knee popliteal bypass with PTFE -Will follow vascular surgery recommendations. -Clopidogrel being held and patient was to be started on baby aspirin later today; plan is for toe amputation today Friday with podiatry and will need to restart his clopidogrel prior to his discharge when ok with Podiatry   CAD -Home Plavix currently held; resumed Home ASA 81 mg po Daily  -Continue Rosuvastatin 20 mg po at bedtime -Lipid panel done and showed a total cholesterol/HDL ratio of 3.3,  cholesterol level 102, HDL 31, LDL 56, triglycerides of 76,  VLDL 15  Hypophosphatemia -Phos Level Trend: Recent Labs  Lab 04/19/23 0307 04/20/23 0321 04/21/23 0312  PHOS 3.2 2.7 2.2*  -Replete with IV Sodium Phos 15 mmol -Continue to Monitor and replete as Necessary -Repeat Phos level in the AM  Hyponatremia -Mild. Na+ Trend: Recent Labs  Lab 04/17/23 0610 04/18/23 0535 04/19/23 0307 04/19/23 1228 04/20/23 0321 04/21/23 0312  NA 133* 136 135 139 132* 134*  -Replete with IV Sodium Phos 15 mmol  -Continue to Monitor and Trend and repeat CMP in the AM   Type II Diabetes Mellitus -Blood sugars controlled. -HbA1c was 7.6 -Continue Accu-Cheks and Moderate Novolog Sliding Scale Insulin -Continue to Monitor and Trend CBGs; Current CBG Trend: Recent Labs  Lab 04/20/23 0608 04/20/23 1148 04/20/23 1650 04/20/23 2132 04/21/23 0600 04/21/23 0818 04/21/23 1148  GLUCAP 136* 192* 191* 222* 200* 187* 173*   ABLA/Normocytic Anemia -Hgb/Hct Trend: Recent Labs  Lab 04/18/23 0535 04/19/23 0307 04/19/23 1228 04/19/23 1511 04/19/23 1931 04/20/23 0321 04/21/23 0312  HGB 14.0 11.7* 14.6 11.7* 11.6* 11.2* 11.8*  HCT 42.3 36.2* 43.0 36.6* 36.0* 35.0* 35.5*  MCV 87.4 91.0  --  90.6 89.6 90.7 87.4  -Checked Anemia Panel and showed an iron level of 26, UIBC of 223, TIBC of 249, saturation of 10%, ferritin level 125, folate level 8.0 and vitamin B12 170 -Will initiate B12 supplementation -Continue to Monitor S/Sx of Bleeding; Repeat CBC pending this Afternoon -Repeat CBC in the AM   Hypoalbuminemia -Patient's Albumin Trend: Recent Labs  Lab 04/17/23 0610 04/19/23 0307 04/20/23 0321 04/21/23 0312  ALBUMIN 2.9* 3.0* 3.2* 3.0*  -Continue to Monitor and Trend and repeat CMP in the AM  Class II Obesity -Complicates overall prognosis and care -Estimated body mass index is 35.44 kg/m as calculated from the following:   Height as of this encounter: 5\' 9"  (1.753 m).   Weight as of this encounter: 108.9 kg.  -Weight Loss and Dietary  Counseling given and Exercise recommended    DVT prophylaxis: heparin injection 5,000 Units Start: 04/20/23 0600 SCD's Start: 04/19/23 1653 SCDs Start: 04/16/23 1723    Code Status: Full Code Family Communication: No family present at bedside  Disposition Plan:  Level of care: Progressive Cardiac Status is: Inpatient Remains inpatient appropriate because: Further clinical improvement and clearance by the specialists   Consultants:  Vascular Surgery Podiatry  Procedures:  As delineated as above  Antimicrobials:  Anti-infectives (From admission, onward)    Start     Dose/Rate Route Frequency Ordered Stop   04/21/23 1515  ceFEPIme (MAXIPIME) 2 g in sodium chloride 0.9 % 100 mL IVPB        2 g 200 mL/hr over 30 Minutes Intravenous Every 8 hours 04/21/23 1503     04/18/23 2200  metroNIDAZOLE (FLAGYL) tablet 500 mg        500 mg Oral Every 12 hours 04/18/23 1336     04/16/23 2000  metroNIDAZOLE (FLAGYL) IVPB 500 mg  Status:  Discontinued        500 mg 100 mL/hr over 60 Minutes Intravenous Every 12 hours 04/16/23 1729 04/18/23 1336   04/16/23 1900  ceFEPIme (MAXIPIME) 2 g in sodium chloride 0.9 % 100 mL IVPB  Status:  Discontinued        2 g 200 mL/hr over 30 Minutes Intravenous Every 12 hours 04/16/23 1751 04/21/23 1503   04/16/23 1900  vancomycin (VANCOCIN) IVPB 1000 mg/200 mL premix  1,000 mg 200 mL/hr over 60 Minutes Intravenous Every 12 hours 04/16/23 1751         Subjective: And examined at bedside had just come out of his surgical intervention for his partial ray amputation.  States that he is having some pain.  No nausea or vomiting.  Happy to get a diet now.  Denies any lightheadedness or dizziness.  No other concerns requested this time.  Objective: Vitals:   04/21/23 0845 04/21/23 0900 04/21/23 0916 04/21/23 1152  BP: (!) 85/51 91/61 108/71 129/82  Pulse: 87 89 85 85  Resp: 17 16 18 20   Temp:  97.6 F (36.4 C)  98 F (36.7 C)  TempSrc:   Oral Oral   SpO2: 92% 93% 100% 93%  Weight:      Height:        Intake/Output Summary (Last 24 hours) at 04/21/2023 1533 Last data filed at 04/21/2023 2130 Gross per 24 hour  Intake 1780 ml  Output 950 ml  Net 830 ml   Filed Weights   04/16/23 1639 04/19/23 0840 04/21/23 0640  Weight: 109.3 kg 108.9 kg 108.9 kg   Examination: Physical Exam:  Constitutional: WN/WD obese Caucasian male in no acute distress Respiratory: Diminished to auscultation bilaterally, no wheezing, rales, rhonchi or crackles. Normal respiratory effort and patient is not tachypenic. No accessory muscle use.  Unlabored breathing Cardiovascular: RRR, no murmurs / rubs / gallops. S1 and S2 auscultated. No extremity edema. Abdomen: Soft, non-tender, distended secondary to body habitus. Bowel sounds positive.  GU: Deferred. Musculoskeletal: Left foot is wrapped Skin: No rashes, lesions, ulcers on limited skin evaluation. No induration; Warm and dry.  Neurologic: CN 2-12 grossly intact with no focal deficits. Romberg sign and cerebellar reflexes not assessed.  Psychiatric: Normal judgment and insight. Alert and oriented x 3. Normal mood and appropriate affect.   Data Reviewed: I have personally reviewed following labs and imaging studies  CBC: Recent Labs  Lab 04/19/23 0307 04/19/23 1228 04/19/23 1511 04/19/23 1931 04/20/23 0321 04/21/23 0312  WBC 15.0*  --  15.8* 16.1* 11.9* 12.9*  NEUTROABS 10.7*  --  11.1*  --  8.8* 9.5*  HGB 11.7* 14.6 11.7* 11.6* 11.2* 11.8*  HCT 36.2* 43.0 36.6* 36.0* 35.0* 35.5*  MCV 91.0  --  90.6 89.6 90.7 87.4  PLT 352  --  349 315 302 289   Basic Metabolic Panel: Recent Labs  Lab 04/17/23 0610 04/18/23 0535 04/19/23 0307 04/19/23 1228 04/19/23 1931 04/20/23 0321 04/21/23 0312  NA 133* 136 135 139  --  132* 134*  K 3.7 4.1 4.4 4.4  --  4.2 4.1  CL 101 99 101  --   --  100 100  CO2 25 28 22   --   --  23 23  GLUCOSE 180* 184* 171*  --   --  136* 178*  BUN 7* 9 14  --   --  12 10   CREATININE 0.84 0.79 0.89  --  0.95 0.96 0.99  CALCIUM 8.8* 8.6* 9.1  --   --  8.0* 8.7*  MG  --   --  2.1  --   --  1.9 1.9  PHOS  --   --  3.2  --   --  2.7 2.2*   GFR: Estimated Creatinine Clearance: 92.9 mL/min (by C-G formula based on SCr of 0.99 mg/dL). Liver Function Tests: Recent Labs  Lab 04/17/23 0610 04/19/23 0307 04/20/23 0321 04/21/23 0312  AST 26 18 21  17  ALT 27 23 22 19   ALKPHOS 72 70 52 55  BILITOT 0.9 0.4 0.7 0.7  PROT 6.1* 6.0* 5.8* 5.8*  ALBUMIN 2.9* 3.0* 3.2* 3.0*   No results for input(s): "LIPASE", "AMYLASE" in the last 168 hours. No results for input(s): "AMMONIA" in the last 168 hours. Coagulation Profile: No results for input(s): "INR", "PROTIME" in the last 168 hours. Cardiac Enzymes: No results for input(s): "CKTOTAL", "CKMB", "CKMBINDEX", "TROPONINI" in the last 168 hours. BNP (last 3 results) No results for input(s): "PROBNP" in the last 8760 hours. HbA1C: No results for input(s): "HGBA1C" in the last 72 hours. CBG: Recent Labs  Lab 04/20/23 1650 04/20/23 2132 04/21/23 0600 04/21/23 0818 04/21/23 1148  GLUCAP 191* 222* 200* 187* 173*   Lipid Profile: Recent Labs    04/19/23 0307 04/20/23 0321  CHOL 102 68  HDL 31* 25*  LDLCALC 56 25  TRIG 76 90  CHOLHDL 3.3 2.7   Thyroid Function Tests: No results for input(s): "TSH", "T4TOTAL", "FREET4", "T3FREE", "THYROIDAB" in the last 72 hours. Anemia Panel: Recent Labs    04/20/23 0321  VITAMINB12 170*  FOLATE 8.0  FERRITIN 125  TIBC 249*  IRON 26*  RETICCTPCT 1.7   Sepsis Labs: No results for input(s): "PROCALCITON", "LATICACIDVEN" in the last 168 hours.  Recent Results (from the past 240 hours)  Surgical pcr screen     Status: None   Collection Time: 04/18/23 11:38 PM   Specimen: Nasal Mucosa; Nasal Swab  Result Value Ref Range Status   MRSA, PCR NEGATIVE NEGATIVE Final   Staphylococcus aureus NEGATIVE NEGATIVE Final    Comment: (NOTE) The Xpert SA Assay (FDA approved  for NASAL specimens in patients 54 years of age and older), is one component of a comprehensive surveillance program. It is not intended to diagnose infection nor to guide or monitor treatment. Performed at [facility], [address] [place], [place] [address]   Aerobic/Anaerobic Culture w Gram Stain (surgical/deep wound)     Status: None (Preliminary result)   Collection Time: 04/21/23  8:02 AM   Specimen: Toe, Left; Amputation  Result Value Ref Range Status   Specimen Description TISSUE  Final   Special Requests LEFT TOE PARTIAL AMP  Final   Gram Stain   Final    NO WBC SEEN ABUNDANT GRAM POSITIVE COCCI IN PAIRS FEW GRAM NEGATIVE RODS Performed at [facility], [address], [place], [place] [address]    Culture PENDING  Incomplete   Report Status PENDING  Incomplete    Radiology Studies: DG Foot 2 Views Left Result Date: 04/21/2023 CLINICAL DATA:  Postop. EXAM: LEFT FOOT - 2 VIEW COMPARISON:  04/15/2023. FINDINGS: Great toe has been resected along with the distal half of the first metatarsal. Previous resection of most of the distal phalanx of the third toe. Chronic resorption of distal aspect of the proximal phalanx of the fifth toe and adjacent portions of the middle phalanx. No evidence active osteomyelitis. Remaining joints are normally spaced and aligned. IMPRESSION: 1. Status post resection great toe and distal half the first metatarsal on the left. No evidence of an operative complication. No evidence of residual osteomyelitis. Electronically Signed   By: Amie Portland M.D.   On: 04/21/2023 12:30   Scheduled Meds:  aspirin EC  81 mg Oral Q0600   vitamin B-12  1,000 mcg Oral Daily   DULoxetine  60 mg Oral Daily   gabapentin  100 mg Oral BID   heparin  5,000 Units Subcutaneous Q8H   insulin aspart  0-15 Units Subcutaneous TID WC   ketorolac  1 drop Right Eye Q6H   lisinopril  20 mg Oral Daily   metroNIDAZOLE  500 mg Oral Q12H   nicotine  21 mg Transdermal  Daily   pantoprazole  40 mg Oral Daily   rosuvastatin  20 mg Oral QHS   sodium chloride flush  3 mL Intravenous Q12H   sodium chloride flush  3 mL Intravenous Q12H   trimethoprim-polymyxin b  1 drop Right Eye Q6H   Continuous Infusions:  sodium chloride     albumin Chambers     ceFEPime (MAXIPIME) IV     magnesium sulfate bolus IVPB     sodium PHOSPHATE IVPB (in mmol) 15 mmol (04/21/23 1235)   vancomycin 1,000 mg (04/21/23 0657)    LOS: 5 days   Marguerita Merles, DO [facility] Available via Epic secure chat 7am-7pm After these hours, please refer to coverage provider listed on [email or web address] 04/21/2023, 3:33 PM

## 2023-04-21 NOTE — Transfer of Care (Signed)
Immediate Anesthesia Transfer of Care Note  Patient: Michael Chambers  Procedure(s) Performed: PARTIAL AMPUTATION LEFT FIRST RAY (Left: First Toe)  Patient Location: PACU  Anesthesia Type:MAC  Level of Consciousness: awake, alert , and oriented  Airway & Oxygen Therapy: Patient Spontanous Breathing  Post-op Assessment: Report given to RN  Post vital signs: Reviewed and stable  Last Vitals:  Vitals Value Taken Time  BP 94/48 04/21/23 0816  Temp 37 C 04/21/23 0815  Pulse 94 04/21/23 0829  Resp 17 04/21/23 0829  SpO2 94 % 04/21/23 0829  Vitals shown include unfiled device data.  Last Pain:  Vitals:   04/21/23 0815  TempSrc:   PainSc: 0-No pain      Patients Stated Pain Goal: 0 (04/21/23 0644)  Complications: No notable events documented.

## 2023-04-21 NOTE — Anesthesia Preprocedure Evaluation (Addendum)
Anesthesia Evaluation  Patient identified by MRN, date of birth, ID band Patient awake    Reviewed: Allergy & Precautions, NPO status , Patient's Chart, lab work & pertinent test results  Airway Mallampati: III  TM Distance: >3 FB Neck ROM: Full    Dental  (+) Poor Dentition, Dental Advisory Given, Missing, Edentulous Upper   Pulmonary COPD, Current Smoker and Patient abstained from smoking. 23 pack year history  10cigg/d   Pulmonary exam normal breath sounds clear to auscultation       Cardiovascular hypertension (127/82 preop), Pt. on medications + Peripheral Vascular Disease  Normal cardiovascular exam+ dysrhythmias Supra Ventricular Tachycardia  Rhythm:Regular Rate:Normal  Echo 2023  1. Left ventricular ejection fraction, by estimation, is 60 to 65%. Left  ventricular ejection fraction by PLAX is 60 %. The left ventricle has  normal function. The left ventricle has no regional wall motion  abnormalities. There is mild left ventricular  hypertrophy. Left ventricular diastolic parameters are consistent with  Grade I diastolic dysfunction (impaired relaxation).   2. Right ventricular systolic function is low normal. The right  ventricular size is normal.   3. The mitral valve is grossly normal. Trivial mitral valve  regurgitation.   4. The aortic valve is tricuspid. Aortic valve regurgitation is not  visualized.   5. The inferior vena cava is normal in size with greater than 50%  respiratory variability, suggesting right atrial pressure of 3 mmHg.     Neuro/Psych  PSYCHIATRIC DISORDERS  Depression    negative neurological ROS     GI/Hepatic ,GERD  Controlled,,(+)     substance abuse  cocaine use  Endo/Other  diabetes, Poorly Controlled, Type 2  A1c 7.6 Obesity BMI 35  Renal/GU negative Renal ROS  negative genitourinary   Musculoskeletal negative musculoskeletal ROS (+)    Abdominal  (+) + obese  Peds   Hematology  (+) Blood dyscrasia, anemia Hb 11.8   Anesthesia Other Findings   Reproductive/Obstetrics negative OB ROS                             Anesthesia Physical Anesthesia Plan  ASA: 3  Anesthesia Plan: MAC   Post-op Pain Management: Tylenol PO (pre-op)*   Induction:   PONV Risk Score and Plan: 2 and Propofol infusion and TIVA  Airway Management Planned: Natural Airway and Simple Face Mask  Additional Equipment: None  Intra-op Plan:   Post-operative Plan:   Informed Consent: I have reviewed the patients History and Physical, chart, labs and discussed the procedure including the risks, benefits and alternatives for the proposed anesthesia with the patient or authorized representative who has indicated his/her understanding and acceptance.       Plan Discussed with: CRNA  Anesthesia Plan Comments:        Anesthesia Quick Evaluation

## 2023-04-21 NOTE — Progress Notes (Signed)
Orthopedic Tech Progress Note Patient Details:  Michael Chambers March 14, 1961 [id number]  Ortho Devices Type of Ortho Device: Postop shoe/boot Ortho Device/Splint Location: LLE Ortho Device/Splint Interventions: Ordered, Application, Adjustment   Post Interventions Patient Tolerated: Well Instructions Provided: Adjustment of device  Tonye Pearson 04/21/2023, 9:31 AM

## 2023-04-21 NOTE — Anesthesia Postprocedure Evaluation (Signed)
Anesthesia Post Note  Patient: Michael Chambers  Procedure(s) Performed: PARTIAL AMPUTATION LEFT FIRST RAY (Left: First Toe)     Patient location during evaluation: PACU Anesthesia Type: MAC Level of consciousness: awake and alert Pain management: pain level controlled Vital Signs Assessment: post-procedure vital signs reviewed and stable Respiratory status: spontaneous breathing, nonlabored ventilation and respiratory function stable Cardiovascular status: blood pressure returned to baseline and stable Postop Assessment: no apparent nausea or vomiting Anesthetic complications: no   No notable events documented.  Last Vitals:  Vitals:   04/21/23 0916 04/21/23 1152  BP: 108/71 129/82  Pulse: 85 85  Resp: 18 20  Temp:  36.7 C  SpO2: 100% 93%    Last Pain:  Vitals:   04/21/23 1152  TempSrc: Oral  PainSc:                  [name]

## 2023-04-21 NOTE — Progress Notes (Signed)
History and Physical Interval Note:  04/21/2023 7:10 AM  Michael Chambers  has presented today for surgery, with the diagnosis of gangrene and osteomyelitis of left great toe.  The various methods of treatment have been discussed with the patient and family. After consideration of risks, benefits and other options for treatment, the patient has consented to   Procedure(s) with comments: AMPUTATION TOE (Left) - Left great toe amputation as a surgical intervention.  The patient's history has been reviewed, patient examined, no change in status, stable for surgery.  I have reviewed the patient's chart and labs.  Questions were answered to the patient's satisfaction.     Jenelle Mages Shaydon Lease

## 2023-04-22 ENCOUNTER — Encounter (HOSPITAL_COMMUNITY): Payer: Self-pay | Admitting: Podiatry

## 2023-04-22 DIAGNOSIS — F1410 Cocaine abuse, uncomplicated: Secondary | ICD-10-CM | POA: Diagnosis not present

## 2023-04-22 DIAGNOSIS — E66812 Obesity, class 2: Secondary | ICD-10-CM | POA: Diagnosis not present

## 2023-04-22 DIAGNOSIS — I2510 Atherosclerotic heart disease of native coronary artery without angina pectoris: Secondary | ICD-10-CM | POA: Diagnosis not present

## 2023-04-22 DIAGNOSIS — I10 Essential (primary) hypertension: Secondary | ICD-10-CM | POA: Diagnosis not present

## 2023-04-22 LAB — GLUCOSE, CAPILLARY
Glucose-Capillary: 146 mg/dL — ABNORMAL HIGH (ref 70–99)
Glucose-Capillary: 125 mg/dL — ABNORMAL HIGH (ref 70–99)
Glucose-Capillary: 135 mg/dL — ABNORMAL HIGH (ref 70–99)
Glucose-Capillary: 163 mg/dL — ABNORMAL HIGH (ref 70–99)

## 2023-04-22 LAB — COMPREHENSIVE METABOLIC PANEL
ALT: 15 U/L (ref 0–44)
AST: 11 U/L — ABNORMAL LOW (ref 15–41)
Albumin: 2.9 g/dL — ABNORMAL LOW (ref 3.5–5.0)
Alkaline Phosphatase: 45 U/L (ref 38–126)
Anion gap: 8 (ref 5–15)
BUN: 8 mg/dL (ref 8–23)
CO2: 25 mmol/L (ref 22–32)
Calcium: 8.5 mg/dL — ABNORMAL LOW (ref 8.9–10.3)
Chloride: 103 mmol/L (ref 98–111)
Creatinine, Ser: 0.8 mg/dL (ref 0.61–1.24)
GFR, Estimated: >60 mL/min (ref >60)
Glucose, Bld: 141 mg/dL — ABNORMAL HIGH (ref 70–99)
Potassium: 4 mmol/L (ref 3.5–5.1)
Sodium: 136 mmol/L (ref 135–145)
Total Bilirubin: 0.7 mg/dL (ref 0.0–1.2)
Total Protein: 5.7 g/dL — ABNORMAL LOW (ref 6.5–8.1)

## 2023-04-22 LAB — CBC WITH DIFFERENTIAL/PLATELET
Abs Immature Granulocytes: 0.1 10*3/uL — ABNORMAL HIGH (ref 0.00–0.07)
Basophils Absolute: 0 10*3/uL (ref 0.0–0.1)
Basophils Relative: 0 %
Eosinophils Absolute: 0.1 10*3/uL (ref 0.0–0.5)
Eosinophils Relative: 1 %
HCT: 35.4 % — ABNORMAL LOW (ref 39.0–52.0)
Hemoglobin: 11.7 g/dL — ABNORMAL LOW (ref 13.0–17.0)
Immature Granulocytes: 1 %
Lymphocytes Relative: 20 %
Lymphs Abs: 2.2 10*3/uL (ref 0.7–4.0)
MCH: 29 pg (ref 26.0–34.0)
MCHC: 33.1 g/dL (ref 30.0–36.0)
MCV: 87.6 fL (ref 80.0–100.0)
Monocytes Absolute: 1.1 10*3/uL — ABNORMAL HIGH (ref 0.1–1.0)
Monocytes Relative: 10 %
Neutro Abs: 7.5 10*3/uL (ref 1.7–7.7)
Neutrophils Relative %: 68 %
Platelets: 291 10*3/uL (ref 150–400)
RBC: 4.04 MIL/uL — ABNORMAL LOW (ref 4.22–5.81)
RDW: 13.2 % (ref 11.5–15.5)
WBC: 11.1 10*3/uL — ABNORMAL HIGH (ref 4.0–10.5)
nRBC: 0 % (ref 0.0–0.2)

## 2023-04-22 LAB — MAGNESIUM: Magnesium: 2 mg/dL (ref 1.7–2.4)

## 2023-04-22 LAB — PHOSPHORUS: Phosphorus: 3 mg/dL (ref 2.5–4.6)

## 2023-04-22 NOTE — Progress Notes (Signed)
PODIATRY PROGRESS NOTE  NAME Michael Chambers MRN [id number] DOB March 08, 1961 DOA 04/16/2023   Reason for consult: Left 1st toe gangrene  History of present illness: 63 y.o. male followed by podiatry status post left partial first ray resection 1/24 with partial wound closure.  Patient seen resting at bedside.  Complains of some soreness to the foot.  Overall doing well. Did have PT/OT eval prior to surgery however requesting assistance with partial weight bearing. Did undergo bypass left femoral to tibioperoneal trunk 1/22.  Vitals:   04/22/23 0833 04/22/23 1222  BP: 128/85 119/82  Pulse: 82 89  Resp: 19 20  Temp: 98.3 F (36.8 C) (!) 97.4 F (36.3 C)  SpO2: 94%        Latest Ref Rng & Units 04/22/2023    2:54 AM 04/21/2023    3:12 AM 04/20/2023    3:21 AM  CBC  WBC 4.0 - 10.5 K/uL 11.1  12.9  11.9   Hemoglobin 13.0 - 17.0 g/dL 19.1  47.8  29.5   Hematocrit 39.0 - 52.0 % 35.4  35.5  35.0   Platelets 150 - 400 K/uL 291  289  302        Latest Ref Rng & Units 04/22/2023    2:54 AM 04/21/2023    3:12 AM 04/20/2023    3:21 AM  BMP  Glucose 70 - 99 mg/dL 621  308  657   BUN 8 - 23 mg/dL 8  10  12    Creatinine 0.61 - 1.24 mg/dL 8.46  9.62  9.52   Sodium 135 - 145 mmol/L 136  134  132   Potassium 3.5 - 5.1 mmol/L 4.0  4.1  4.2   Chloride 98 - 111 mmol/L 103  100  100   CO2 22 - 32 mmol/L 25  23  23    Calcium 8.9 - 10.3 mg/dL 8.5  8.7  8.0       Physical Exam: General: AOD x 3, no acute distress   Left lower extremity focused physical exam: Left lower extremity dressing left clean dry intact No evidence of strikethrough to the dressing No ascending erythema or streaking lymphangitis.  No cyanosis or pallor to the digits.  Cap refill intact to the digits Negative Denna Haggard' sign Digital dexterity intact.  Epicritic sensation grossly intact.    ASSESSMENT/PLAN OF CARE Status post left partial 1st ray resection due to left hallux gangrene, underlying osteomyelitis.PMHx  significant for  hypertension, atrial flutter, carotid artery disease status post left endarterectomy, PAD status post left femoral to tibioperoneal trunk bypass, CAD, CRAO, DM2 w/ neuropathy, cocaine use   WBC: 11.1  -Dressing left clean dry and intact today. - Pain control per primary team -Surgical cultures and pathology pending. - Anticipate clean surgical margins -Currently on Vanc, Cefepime, Flagyl. De-escalate as appropriate as culture data emerges  -Okay to weight-bear as tolerated in surgical shoe for short distances and for transfers.  Instructed patient to favor heel.  He is requesting to work further with PT OT on safe weightbearing. -Currently on plavix - Plan to change dressing on post op day 2. - Patient may follow up with Dr. Annamary Rummage within 1 week of discharge once medically stable. He does live in [place] and he expresses concern regarding being able to come to our office for follow up. He may need assistance finding closer follow up.  Please contact me directly with any questions or concerns.     Bronwen Betters, DPM [facility]  Dr. Bronwen Betters, DPM    [address] [place], [place] [address]                Office [phone]  Fax [phone]

## 2023-04-22 NOTE — Progress Notes (Signed)
    Subjective  - POD # 3, status post left femoral to tibioperoneal trunk bypass graft  POD 1 toe amputation today by Dr. Annamary Rummage   C/o some soreness but overall feeling well   Physical Exam:  No acute distress HDS Incisions are healing appropriately PT and AT signals multiphasic, DP obstructed by foot dressing  Assessment/Plan:  Recovering well from L fem to TPT bypass and great toe amp  -plavix restarted yesterday, no signs of bleeding. HGB stable  -continue PT -Ok for DC planning from vascular perspective. -Will arrange for outpatient follow up with vascular studies  Michael Chambers 04/22/2023 8:59 AM --  Vitals:   04/22/23 0342 04/22/23 0833  BP: 96/66 128/85  Pulse: 75 82  Resp: 20 19  Temp: 98.1 F (36.7 C) 98.3 F (36.8 C)  SpO2: 95% 94%    Intake/Output Summary (Last 24 hours) at 04/22/2023 0859 Last data filed at 04/22/2023 0700 Gross per 24 hour  Intake --  Output 950 ml  Net -950 ml     Laboratory CBC    Component Value Date/Time   WBC 11.1 (H) 04/22/2023 0254   HGB 11.7 (L) 04/22/2023 0254   HCT 35.4 (L) 04/22/2023 0254   PLT 291 04/22/2023 0254    BMET    Component Value Date/Time   NA 136 04/22/2023 0254   K 4.0 04/22/2023 0254   CL 103 04/22/2023 0254   CO2 25 04/22/2023 0254   GLUCOSE 141 (H) 04/22/2023 0254   BUN 8 04/22/2023 0254   CREATININE 0.80 04/22/2023 0254   CALCIUM 8.5 (L) 04/22/2023 0254   GFRNONAA >60 04/22/2023 0254   GFRAA >60 03/16/2016 0859    COAG Lab Results  Component Value Date   INR 1.0 05/31/2021   INR 1.0 03/22/2007   No results found for: "PTT"  Antibiotics Anti-infectives (From admission, onward)    Start     Dose/Rate Route Frequency Ordered Stop   04/21/23 1515  ceFEPIme (MAXIPIME) 2 g in sodium chloride 0.9 % 100 mL IVPB        2 g 200 mL/hr over 30 Minutes Intravenous Every 8 hours 04/21/23 1503     04/18/23 2200  metroNIDAZOLE (FLAGYL) tablet 500 mg        500 mg Oral Every 12 hours  04/18/23 1336     04/16/23 2000  metroNIDAZOLE (FLAGYL) IVPB 500 mg  Status:  Discontinued        500 mg 100 mL/hr over 60 Minutes Intravenous Every 12 hours 04/16/23 1729 04/18/23 1336   04/16/23 1900  ceFEPIme (MAXIPIME) 2 g in sodium chloride 0.9 % 100 mL IVPB  Status:  Discontinued        2 g 200 mL/hr over 30 Minutes Intravenous Every 12 hours 04/16/23 1751 04/21/23 1503   04/16/23 1900  vancomycin (VANCOCIN) IVPB 1000 mg/200 mL premix        1,000 mg 200 mL/hr over 60 Minutes Intravenous Every 12 hours 04/16/23 1751
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
## 2023-04-22 NOTE — Progress Notes (Signed)
Physical Therapy Re-Evaluation Patient Details Name: Michael Chambers MRN: [id number] DOB: [birth year] Today's Date: 04/22/2023   History of Present Illness Pt is a 63 y/o male admitted with gangrene of L toe. Pt  found to have occluded L superficial femo artery stent. Underwent L femoral to popliteal bypass and wound vac placement on 1/22. S/p L great toe amputation 1/24. PMH: CAD, depression, DM, GERD, hx of substance abuse, hx of suicide attempt, HTN, CAD.    PT Comments  Pt is presenting close to prior to surgery. No significant change in deficits and goals remain appropriate. Pt is demonstrating supervision level ambulation and sit to stands. Pt back mobility is limited due to back surgery. Due to pt current functional status, home set up and available assistance at home recommending skilled physical therapy services 3x/week in order to address strength, balance and functional mobility to decrease risk for falls, injury and re-hospitalization.       If plan is discharge home, recommend the following: A little help with walking and/or transfers;Assist for transportation;Assistance with cooking/housework;Help with stairs or ramp for entrance     Equipment Recommendations  Rolling walker (2 wheels);BSC/3in1       Precautions / Restrictions Precautions Precautions: Fall;Other (comment) Precaution Comments: blind L eye, impaired R hand function Restrictions Weight Bearing Restrictions Per Provider Order: No Other Position/Activity Restrictions: post op shoe     Mobility  Bed Mobility       General bed mobility comments: pt received walking in hall with OT and returned to recliner    Transfers Overall transfer level: Needs assistance Equipment used: Rolling walker (2 wheels) Transfers: Sit to/from Stand Sit to Stand: Supervision           General transfer comment: supervision for standing to sitting. Verbal cues for safe hand placement    Ambulation/Gait Ambulation/Gait  assistance: Supervision Gait Distance (Feet): 200 Feet Assistive device: Rolling walker (2 wheels) Gait Pattern/deviations: Antalgic, Step-through pattern, Decreased step length - right, Decreased stance time - left Gait velocity: decreased Gait velocity interpretation: 1.31 - 2.62 ft/sec, indicative of limited community ambulator   General Gait Details: Slow slightly unsteady gait with RW.   Stairs Stairs:  (discussed stair navigation at home with RW. Pt only has one platfrom step. Cued with up with the good down with the bad.)              Balance Overall balance assessment: Needs assistance Sitting-balance support: No upper extremity supported, Feet supported Sitting balance-Leahy Scale: Good     Standing balance support: During functional activity, No upper extremity supported, Bilateral upper extremity supported, Reliant on assistive device for balance Standing balance-Leahy Scale: Fair Standing balance comment: Fair+       Cognition Arousal: Alert Behavior During Therapy: WFL for tasks assessed/performed, Flat affect Overall Cognitive Status: Within Functional Limits for tasks assessed           General Comments General comments (skin integrity, edema, etc.): Pt continues to require RW. Concerned about donning post op shoe at home.      Pertinent Vitals/Pain Pain Assessment Pain Assessment: Faces Faces Pain Scale: Hurts a little bit Pain Location: L foot Pain Descriptors / Indicators: Grimacing, Guarding Pain Intervention(s): Limited activity within patient's tolerance, Monitored during session           PT Goals (current goals can now be found in the care plan section) Acute Rehab PT Goals Patient Stated Goal: to go home PT Goal Formulation: With patient Time For Goal  Achievement: 05/04/23 Potential to Achieve Goals: Good Progress towards PT goals: Progressing toward goals    Frequency    Min 1X/week      PT Plan  Continue with current  POC        AM-PAC PT "6 Clicks" Mobility   Outcome Measure  Help needed turning from your back to your side while in a flat bed without using bedrails?: None Help needed moving from lying on your back to sitting on the side of a flat bed without using bedrails?: A Little Help needed moving to and from a bed to a chair (including a wheelchair)?: A Little Help needed standing up from a chair using your arms (e.g., wheelchair or bedside chair)?: A Little Help needed to walk in hospital room?: A Little Help needed climbing 3-5 steps with a railing? : A Little 6 Click Score: 19    End of Session Equipment Utilized During Treatment: Gait belt Activity Tolerance: Patient tolerated treatment well Patient left: with call bell/phone within reach;in chair;Other (comment) (OT in room) Nurse Communication: Mobility status PT Visit Diagnosis: Other abnormalities of gait and mobility (R26.89)     Time: 1445-1500 PT Time Calculation (min) (ACUTE ONLY): 15 min  Charges:    $Therapeutic Activity: 8-22 mins PT General Charges $$ ACUTE PT VISIT: 1 Visit    Michael Chambers, DPT, CLT  Acute Rehabilitation Services Office: [phone] (Secure chat preferred)                     Claudia Desanctis 04/22/2023, 4:06 PM

## 2023-04-22 NOTE — Plan of Care (Signed)
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
## 2023-04-23 DIAGNOSIS — E66812 Obesity, class 2: Secondary | ICD-10-CM | POA: Diagnosis not present

## 2023-04-23 DIAGNOSIS — F1410 Cocaine abuse, uncomplicated: Secondary | ICD-10-CM | POA: Diagnosis not present

## 2023-04-23 DIAGNOSIS — I10 Essential (primary) hypertension: Secondary | ICD-10-CM | POA: Diagnosis not present

## 2023-04-23 DIAGNOSIS — I2510 Atherosclerotic heart disease of native coronary artery without angina pectoris: Secondary | ICD-10-CM | POA: Diagnosis not present

## 2023-04-23 LAB — CBC WITH DIFFERENTIAL/PLATELET
Abs Immature Granulocytes: 0.1 10*3/uL — ABNORMAL HIGH (ref 0.00–0.07)
Basophils Absolute: 0.1 10*3/uL (ref 0.0–0.1)
Basophils Relative: 0 %
Eosinophils Absolute: 0.2 10*3/uL (ref 0.0–0.5)
Eosinophils Relative: 2 %
HCT: 34.8 % — ABNORMAL LOW (ref 39.0–52.0)
Hemoglobin: 11.7 g/dL — ABNORMAL LOW (ref 13.0–17.0)
Immature Granulocytes: 1 %
Lymphocytes Relative: 21 %
Lymphs Abs: 2.5 10*3/uL (ref 0.7–4.0)
MCH: 28.9 pg (ref 26.0–34.0)
MCHC: 33.6 g/dL (ref 30.0–36.0)
MCV: 85.9 fL (ref 80.0–100.0)
Monocytes Absolute: 0.9 10*3/uL (ref 0.1–1.0)
Monocytes Relative: 8 %
Neutro Abs: 7.8 10*3/uL — ABNORMAL HIGH (ref 1.7–7.7)
Neutrophils Relative %: 68 %
Platelets: 330 10*3/uL (ref 150–400)
RBC: 4.05 MIL/uL — ABNORMAL LOW (ref 4.22–5.81)
RDW: 13.2 % (ref 11.5–15.5)
WBC: 11.5 10*3/uL — ABNORMAL HIGH (ref 4.0–10.5)
nRBC: 0 % (ref 0.0–0.2)

## 2023-04-23 LAB — COMPREHENSIVE METABOLIC PANEL
ALT: 15 U/L (ref 0–44)
AST: 14 U/L — ABNORMAL LOW (ref 15–41)
Albumin: 2.8 g/dL — ABNORMAL LOW (ref 3.5–5.0)
Alkaline Phosphatase: 52 U/L (ref 38–126)
Anion gap: 10 (ref 5–15)
BUN: 11 mg/dL (ref 8–23)
CO2: 23 mmol/L (ref 22–32)
Calcium: 8.8 mg/dL — ABNORMAL LOW (ref 8.9–10.3)
Chloride: 102 mmol/L (ref 98–111)
Creatinine, Ser: 0.87 mg/dL (ref 0.61–1.24)
GFR, Estimated: >60 mL/min (ref >60)
Glucose, Bld: 143 mg/dL — ABNORMAL HIGH (ref 70–99)
Potassium: 4.1 mmol/L (ref 3.5–5.1)
Sodium: 135 mmol/L (ref 135–145)
Total Bilirubin: 0.7 mg/dL (ref 0.0–1.2)
Total Protein: 6 g/dL — ABNORMAL LOW (ref 6.5–8.1)

## 2023-04-23 LAB — GLUCOSE, CAPILLARY
Glucose-Capillary: 145 mg/dL — ABNORMAL HIGH (ref 70–99)
Glucose-Capillary: 139 mg/dL — ABNORMAL HIGH (ref 70–99)
Glucose-Capillary: 179 mg/dL — ABNORMAL HIGH (ref 70–99)
Glucose-Capillary: 177 mg/dL — ABNORMAL HIGH (ref 70–99)

## 2023-04-23 LAB — PHOSPHORUS: Phosphorus: 2.8 mg/dL (ref 2.5–4.6)

## 2023-04-23 LAB — MAGNESIUM: Magnesium: 2.1 mg/dL (ref 1.7–2.4)
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
## 2023-04-23 NOTE — Plan of Care (Signed)

## 2023-04-23 NOTE — Progress Notes (Addendum)
  Progress Note    04/23/2023 9:34 AM 2 Days Post-Op  Subjective:  no complaints   Vitals:   04/23/23 0255 04/23/23 0838  BP: 121/77 101/74  Pulse: 80 92  Resp: 14 16  Temp: 98 F (36.7 C) 97.7 F (36.5 C)  SpO2: 95% 93%   Physical Exam: Lungs:  non labored Incisions:  L groin with incisional vac; L pop c/d/i Extremities:  dressing left in place L foot; brisk PT signal above bandage Neurologic: A&O  CBC    Component Value Date/Time   WBC 11.5 (H) 04/23/2023 0251   RBC 4.05 (L) 04/23/2023 0251   HGB 11.7 (L) 04/23/2023 0251   HCT 34.8 (L) 04/23/2023 0251   PLT 330 04/23/2023 0251   MCV 85.9 04/23/2023 0251   MCH 28.9 04/23/2023 0251   MCHC 33.6 04/23/2023 0251   RDW 13.2 04/23/2023 0251   LYMPHSABS 2.5 04/23/2023 0251   MONOABS 0.9 04/23/2023 0251   EOSABS 0.2 04/23/2023 0251   BASOSABS 0.1 04/23/2023 0251    BMET    Component Value Date/Time   NA 135 04/23/2023 0251   K 4.1 04/23/2023 0251   CL 102 04/23/2023 0251   CO2 23 04/23/2023 0251   GLUCOSE 143 (H) 04/23/2023 0251   BUN 11 04/23/2023 0251   CREATININE 0.87 04/23/2023 0251   CALCIUM 8.8 (L) 04/23/2023 0251   GFRNONAA >60 04/23/2023 0251   GFRAA >60 03/16/2016 0859    INR    Component Value Date/Time   INR 1.0 05/31/2021 1117     Intake/Output Summary (Last 24 hours) at 04/23/2023 0934 Last data filed at 04/23/2023 4098 Gross per 24 hour  Intake 480 ml  Output 2050 ml  Net -1570 ml     Assessment/Plan:  63 y.o. male is s/p L femoral to TP trunk bypass  2 Days Post-Op   LLE well perfused by doppler Groin with incisional vac with good seal; popliteal incision healing well; Patient educated on removing incisional vac 1 week from surgery Continue aspirin, plavix, statin Ambulate with shoe to avoid pressure on toe amp site [facility] for discharge home from our standpoint; office will arrange follow up   Emilie Rutter, PA-C [facility] [phone] 04/23/2023 9:34  AM   VASCULAR STAFF ADDENDUM: I have independently interviewed and examined the patient. I agree with the above.    Daria Pastures MD [facility] Phone Number: [phone] 04/23/2023 9:43 AM

## 2023-04-23 NOTE — Progress Notes (Signed)
Pharmacy Antibiotic Note  Michael Chambers is a 63 y.o. male admitted on 04/16/2023 with  diabetic foot infection w/ gangrene .  Continues on Vancomycin, Cefepime, and Metronidazole.  SCr stable, WBC remains elevated, afebrile 1/24 s/p partial left great toe amputation 1/24 left toe tissue cx: abundant GPC pairs on gram stain, few GNR  Plan:  Continue vancomycin 1000 mg IV q12h (eAUC 556, Scr 1.01) -goal AUC 400-600, trough 15-31mcg/mL Continue cefepime 2g IV q 8h Flagyl per MD Monitor renal function Follow up cx results and for antibiotic LOT   Height: 5\' 9"  (175.3 cm) Weight: 108.9 kg (240 lb) IBW/kg (Calculated) : 70.7  Temp (24hrs), Avg:98 F (36.7 C), Min:97.5 F (36.4 C), Max:98.3 F (36.8 C)  Recent Labs  Lab 04/19/23 1931 04/20/23 0321 04/21/23 0312 04/22/23 0254 04/23/23 0251  WBC 16.1* 11.9* 12.9* 11.1* 11.5*  CREATININE 0.95 0.96 0.99 0.80 0.87    Estimated Creatinine Clearance: 105.7 mL/min (by C-G formula based on SCr of 0.87 mg/dL).    Allergies  Allergen Reactions   Dimetapp Cold-Allergy [Brompheniramine-Phenylephrine]     hives   Brompheniramine Hives   Chlorpheniramine Hives   Dextromethorphan Hives   Ibuprofen Nausea And Vomiting   Ivp Dye [Iodinated Contrast Media] Itching   Thank you for involving pharmacy in this patient's care.  Loura Back, PharmD, BCPS Clinical Pharmacist Clinical phone for 04/23/2023 is [phone] 04/23/2023 12:46 PM

## 2023-04-23 NOTE — Progress Notes (Signed)
Mobility Specialist Progress Note:    04/23/23 1346  Mobility  Activity Ambulated with assistance in hallway  Level of Assistance Contact guard assist, steadying assist  Assistive Device Front wheel walker  Distance Ambulated (ft) 250 ft  LLE Weight Bearing Per Provider Order WBAT  Activity Response Tolerated well  Mobility Referral Yes  Mobility visit 1 Mobility  Mobility Specialist Start Time (ACUTE ONLY) 1200  Mobility Specialist Stop Time (ACUTE ONLY) 1218  Mobility Specialist Time Calculation (min) (ACUTE ONLY) 18 min   Received pt in bed having no complaints and agreeable to mobility. Pt was asymptomatic throughout ambulation and returned to room w/o fault. Left in chair w/ call bell in reach and all needs met.   Thompson Grayer Mobility Specialist  Please contact vis Secure Chat or  Rehab Office [phone]

## 2023-04-23 NOTE — Progress Notes (Signed)
PODIATRY PROGRESS NOTE  NAME SHAHEIM MAHAR MRN [id number] DOB 1960-05-25 DOA 04/16/2023   Reason for consult: Left 1st toe gangrene  History of present illness: 63 y.o. male followed by podiatry status post left partial first ray resection 1/24 with partial wound closure.  Patient seen resting at bedside.  Pain well controlled.  Overall doing well. Did undergo bypass left femoral to tibioperoneal trunk 1/22.  Vitals:   04/23/23 1039 04/23/23 1110  BP: 119/76 130/80  Pulse:    Resp: 15 17  Temp:    SpO2:         Latest Ref Rng & Units 04/23/2023    2:51 AM 04/22/2023    2:54 AM 04/21/2023    3:12 AM  CBC  WBC 4.0 - 10.5 K/uL 11.5  11.1  12.9   Hemoglobin 13.0 - 17.0 g/dL 86.5  78.4  69.6   Hematocrit 39.0 - 52.0 % 34.8  35.4  35.5   Platelets 150 - 400 K/uL 330  291  289        Latest Ref Rng & Units 04/23/2023    2:51 AM 04/22/2023    2:54 AM 04/21/2023    3:12 AM  BMP  Glucose 70 - 99 mg/dL 295  284  132   BUN 8 - 23 mg/dL 11  8  10    Creatinine 0.61 - 1.24 mg/dL 4.40  1.02  7.25   Sodium 135 - 145 mmol/L 135  136  134   Potassium 3.5 - 5.1 mmol/L 4.1  4.0  4.1   Chloride 98 - 111 mmol/L 102  103  100   CO2 22 - 32 mmol/L 23  25  23    Calcium 8.9 - 10.3 mg/dL 8.8  8.5  8.7       Physical Exam: General: AOD x 3, no acute distress   Left lower extremity focused physical exam: Foot appears warm and well perfused. Palpable pedal pulse. Surgical incision noted with sutures intact. Area of partial closure distally appears stable. No ascending erythema or streaking lymphangitis.  No cyanosis or pallor to the remaining digits.  Cap refill intact to the digits Negative Denna Haggard' sign Digital dexterity intact.  Epicritic sensation grossly intact.    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 - Anticipate clean surgical margins -Currently on Vanc, Cefepime, Flagyl. De-escalate as appropriate as culture data emerges  -Okay to weight-bear as tolerated in surgical shoe for short distances and for transfers.  Instructed patient to favor heel.  He is requesting to work further with PT OT on safe weightbearing. -Currently on plavix - Patient may follow up with Dr. Annamary Rummage within 1 week of discharge once medically stable. He does live in [place] and he expresses concern regarding being able to come to our office for follow up. He may need assistance finding closer follow up.  Please contact me directly with any questions or concerns.     Bronwen Betters, DPM [facility]  Dr. Bronwen Betters, DPM    [address]  [place], [place] [address]                Office [phone]  Fax [phone]

## 2023-04-24 ENCOUNTER — Telehealth: Payer: Self-pay | Admitting: Podiatry

## 2023-04-24 DIAGNOSIS — E11628 Type 2 diabetes mellitus with other skin complications: Secondary | ICD-10-CM | POA: Diagnosis not present

## 2023-04-24 DIAGNOSIS — I2510 Atherosclerotic heart disease of native coronary artery without angina pectoris: Secondary | ICD-10-CM | POA: Diagnosis not present

## 2023-04-24 DIAGNOSIS — E66812 Obesity, class 2: Secondary | ICD-10-CM | POA: Diagnosis not present

## 2023-04-24 DIAGNOSIS — I96 Gangrene, not elsewhere classified: Secondary | ICD-10-CM | POA: Diagnosis not present

## 2023-04-24 LAB — COMPREHENSIVE METABOLIC PANEL
ALT: 17 U/L (ref 0–44)
AST: 17 U/L (ref 15–41)
Albumin: 2.7 g/dL — ABNORMAL LOW (ref 3.5–5.0)
Alkaline Phosphatase: 53 U/L (ref 38–126)
Anion gap: 9 (ref 5–15)
BUN: 12 mg/dL (ref 8–23)
CO2: 24 mmol/L (ref 22–32)
Calcium: 8.7 mg/dL — ABNORMAL LOW (ref 8.9–10.3)
Chloride: 102 mmol/L (ref 98–111)
Creatinine, Ser: 0.91 mg/dL (ref 0.61–1.24)
GFR, Estimated: >60 mL/min (ref >60)
Glucose, Bld: 165 mg/dL — ABNORMAL HIGH (ref 70–99)
Potassium: 4.4 mmol/L (ref 3.5–5.1)
Sodium: 135 mmol/L (ref 135–145)
Total Bilirubin: 0.5 mg/dL (ref 0.0–1.2)
Total Protein: 6 g/dL — ABNORMAL LOW (ref 6.5–8.1)

## 2023-04-24 LAB — GLUCOSE, CAPILLARY
Glucose-Capillary: 149 mg/dL — ABNORMAL HIGH (ref 70–99)
Glucose-Capillary: 177 mg/dL — ABNORMAL HIGH (ref 70–99)
Glucose-Capillary: 193 mg/dL — ABNORMAL HIGH (ref 70–99)
Glucose-Capillary: 164 mg/dL — ABNORMAL HIGH (ref 70–99)

## 2023-04-24 LAB — CBC WITH DIFFERENTIAL/PLATELET
Abs Immature Granulocytes: 0.11 10*3/uL — ABNORMAL HIGH (ref 0.00–0.07)
Basophils Absolute: 0.1 10*3/uL (ref 0.0–0.1)
Basophils Relative: 1 %
Eosinophils Absolute: 0.2 10*3/uL (ref 0.0–0.5)
Eosinophils Relative: 2 %
HCT: 36.6 % — ABNORMAL LOW (ref 39.0–52.0)
Hemoglobin: 11.8 g/dL — ABNORMAL LOW (ref 13.0–17.0)
Immature Granulocytes: 1 %
Lymphocytes Relative: 24 %
Lymphs Abs: 2.6 10*3/uL (ref 0.7–4.0)
MCH: 28.4 pg (ref 26.0–34.0)
MCHC: 32.2 g/dL (ref 30.0–36.0)
MCV: 88.2 fL (ref 80.0–100.0)
Monocytes Absolute: 0.9 10*3/uL (ref 0.1–1.0)
Monocytes Relative: 8 %
Neutro Abs: 7.1 10*3/uL (ref 1.7–7.7)
Neutrophils Relative %: 64 %
Platelets: 347 10*3/uL (ref 150–400)
RBC: 4.15 MIL/uL — ABNORMAL LOW (ref 4.22–5.81)
RDW: 13.2 % (ref 11.5–15.5)
WBC: 10.9 10*3/uL — ABNORMAL HIGH (ref 4.0–10.5)
nRBC: 0 % (ref 0.0–0.2)

## 2023-04-24 LAB — SURGICAL PATHOLOGY

## 2023-04-24 LAB — MAGNESIUM: Magnesium: 2.1 mg/dL (ref 1.7–2.4)

## 2023-04-24 LAB — PHOSPHORUS: Phosphorus: 3.2 mg/dL (ref 2.5–4.6)

## 2023-04-24 NOTE — Progress Notes (Signed)
Mobility Specialist Progress Note:    04/24/23 1150  Mobility  Activity Ambulated with assistance in hallway  Level of Assistance Standby assist, set-up cues, supervision of patient - no hands on  Assistive Device Front wheel walker  Distance Ambulated (ft) 290 ft  Activity Response Tolerated well  Mobility Referral Yes  Mobility visit 1 Mobility  Mobility Specialist Start Time (ACUTE ONLY) 1140  Mobility Specialist Stop Time (ACUTE ONLY) 1150  Mobility Specialist Time Calculation (min) (ACUTE ONLY) 10 min   Pt received dangling EOB, agreeable to mobility session. Required SBA via RW for safety. Tolerated well, asx throughout. Pt had post op shoe donned prior to session. Returned pt to room, left with all needs met.   Feliciana Rossetti Mobility Specialist Please contact via Special educational needs teacher or  Rehab office at [phone]

## 2023-04-24 NOTE — Progress Notes (Signed)
Mobility Specialist Progress Note:   04/24/23 1445  Mobility  Activity Ambulated with assistance in hallway  Level of Assistance Standby assist, set-up cues, supervision of patient - no hands on  Assistive Device Cane  Distance Ambulated (ft) 280 ft  LLE Weight Bearing Per Provider Order WBAT  Activity Response Tolerated well  Mobility Referral Yes  Mobility visit 1 Mobility  Mobility Specialist Start Time (ACUTE ONLY) 1435  Mobility Specialist Stop Time (ACUTE ONLY) 1445  Mobility Specialist Time Calculation (min) (ACUTE ONLY) 10 min   Pt received in bed, eager for mobility session. Ambulated with SBA via cane for this session. Tolerated well, no LOB. Returned pt to room with all needs met, call bell in reach, all needs met.   Feliciana Rossetti Mobility Specialist Please contact via Special educational needs teacher or  Rehab office at [phone]
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
## 2023-04-24 NOTE — Telephone Encounter (Signed)
Pts number is not in service.  I called pts emergency contact ( his sister) and she said he is still in the hospital due to them finding an infection. He may get out on Wednesday.I scheduled pt for Thursday 1/30 and told her to call if they cannot make appt.

## 2023-04-24 NOTE — Progress Notes (Signed)
  Progress Note    04/24/2023 7:46 AM 3 Days Post-Op  Subjective:  says he does not want to go home with the vac to his left groin    Vitals:   04/24/23 0025 04/24/23 0329  BP: 134/78 119/78  Pulse: 80 73  Resp: 15 15  Temp: 97.6 F (36.4 C) 97.6 F (36.4 C)  SpO2: 95% 96%    Physical Exam: General:  sitting up in bed eating breakfast Cardiac:  regular Lungs:  nonlabored Incisions:  L groin with incisional vac. L popliteal incision well appearing and dry Extremities:  Left foot bandaged and dry. Brisk PT signal above bandaging  CBC    Component Value Date/Time   WBC 10.9 (H) 04/24/2023 0315   RBC 4.15 (L) 04/24/2023 0315   HGB 11.8 (L) 04/24/2023 0315   HCT 36.6 (L) 04/24/2023 0315   PLT 347 04/24/2023 0315   MCV 88.2 04/24/2023 0315   MCH 28.4 04/24/2023 0315   MCHC 32.2 04/24/2023 0315   RDW 13.2 04/24/2023 0315   LYMPHSABS 2.6 04/24/2023 0315   MONOABS 0.9 04/24/2023 0315   EOSABS 0.2 04/24/2023 0315   BASOSABS 0.1 04/24/2023 0315    BMET    Component Value Date/Time   NA 135 04/24/2023 0315   K 4.4 04/24/2023 0315   CL 102 04/24/2023 0315   CO2 24 04/24/2023 0315   GLUCOSE 165 (H) 04/24/2023 0315   BUN 12 04/24/2023 0315   CREATININE 0.91 04/24/2023 0315   CALCIUM 8.7 (L) 04/24/2023 0315   GFRNONAA >60 04/24/2023 0315   GFRAA >60 03/16/2016 0859    INR    Component Value Date/Time   INR 1.0 05/31/2021 1117     Intake/Output Summary (Last 24 hours) at 04/24/2023 0746 Last data filed at 04/24/2023 0329 Gross per 24 hour  Intake 680 ml  Output 2150 ml  Net -1470 ml      Assessment/Plan:  63 y.o. male is 5 days post op, s/p: L femoral to TPT bypass   -Left lower extremity is warm and well perfused with brisk PT signal above foot bandaging -Progressing in mobility. Continue to ambulate with post op shoe to offload amp site -L groin with incisional vac and left popliteal incision healing appropriately -Continue ASA, plavix, statin -Ok  for d/c from vascular standpoint. Patient says he does not want to go home with the incisional vac on and says he will not be able to remove it himself. If he is ready for d/c today, we can remove his vac.   Loel Dubonnet, PA-C [facility] [phone] 04/24/2023 7:46 AM
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
## 2023-04-25 DIAGNOSIS — E66812 Obesity, class 2: Secondary | ICD-10-CM | POA: Diagnosis not present

## 2023-04-25 DIAGNOSIS — E11628 Type 2 diabetes mellitus with other skin complications: Secondary | ICD-10-CM | POA: Diagnosis not present

## 2023-04-25 DIAGNOSIS — I2510 Atherosclerotic heart disease of native coronary artery without angina pectoris: Secondary | ICD-10-CM | POA: Diagnosis not present

## 2023-04-25 DIAGNOSIS — I96 Gangrene, not elsewhere classified: Secondary | ICD-10-CM | POA: Diagnosis not present

## 2023-04-25 LAB — CBC WITH DIFFERENTIAL/PLATELET
Abs Immature Granulocytes: 0.16 10*3/uL — ABNORMAL HIGH (ref 0.00–0.07)
Basophils Absolute: 0.1 10*3/uL (ref 0.0–0.1)
Basophils Relative: 1 %
Eosinophils Absolute: 0.3 10*3/uL (ref 0.0–0.5)
Eosinophils Relative: 3 %
HCT: 36.7 % — ABNORMAL LOW (ref 39.0–52.0)
Hemoglobin: 12 g/dL — ABNORMAL LOW (ref 13.0–17.0)
Immature Granulocytes: 2 %
Lymphocytes Relative: 28 %
Lymphs Abs: 3 10*3/uL (ref 0.7–4.0)
MCH: 28.8 pg (ref 26.0–34.0)
MCHC: 32.7 g/dL (ref 30.0–36.0)
MCV: 88.2 fL (ref 80.0–100.0)
Monocytes Absolute: 0.8 10*3/uL (ref 0.1–1.0)
Monocytes Relative: 8 %
Neutro Abs: 6.5 10*3/uL (ref 1.7–7.7)
Neutrophils Relative %: 58 %
Platelets: 355 10*3/uL (ref 150–400)
RBC: 4.16 MIL/uL — ABNORMAL LOW (ref 4.22–5.81)
RDW: 13.4 % (ref 11.5–15.5)
WBC: 10.8 10*3/uL — ABNORMAL HIGH (ref 4.0–10.5)
nRBC: 0 % (ref 0.0–0.2)

## 2023-04-25 LAB — COMPREHENSIVE METABOLIC PANEL
ALT: 20 U/L (ref 0–44)
AST: 21 U/L (ref 15–41)
Albumin: 2.8 g/dL — ABNORMAL LOW (ref 3.5–5.0)
Alkaline Phosphatase: 47 U/L (ref 38–126)
Anion gap: 8 (ref 5–15)
BUN: 11 mg/dL (ref 8–23)
CO2: 25 mmol/L (ref 22–32)
Calcium: 8.9 mg/dL (ref 8.9–10.3)
Chloride: 105 mmol/L (ref 98–111)
Creatinine, Ser: 0.82 mg/dL (ref 0.61–1.24)
GFR, Estimated: >60 mL/min (ref >60)
Glucose, Bld: 152 mg/dL — ABNORMAL HIGH (ref 70–99)
Potassium: 4.6 mmol/L (ref 3.5–5.1)
Sodium: 138 mmol/L (ref 135–145)
Total Bilirubin: 0.5 mg/dL (ref 0.0–1.2)
Total Protein: 5.9 g/dL — ABNORMAL LOW (ref 6.5–8.1)

## 2023-04-25 LAB — GLUCOSE, CAPILLARY
Glucose-Capillary: 126 mg/dL — ABNORMAL HIGH (ref 70–99)
Glucose-Capillary: 174 mg/dL — ABNORMAL HIGH (ref 70–99)
Glucose-Capillary: 138 mg/dL — ABNORMAL HIGH (ref 70–99)
Glucose-Capillary: 227 mg/dL — ABNORMAL HIGH (ref 70–99)

## 2023-04-25 LAB — MAGNESIUM: Magnesium: 2.1 mg/dL (ref 1.7–2.4)

## 2023-04-25 LAB — PHOSPHORUS: Phosphorus: 3.5 mg/dL (ref 2.5–4.6)

## 2023-04-25 NOTE — Progress Notes (Signed)
Mobility Specialist Progress Note:    04/25/23 0900  Mobility  Activity Ambulated with assistance in hallway  Level of Assistance Contact guard assist, steadying assist  Assistive Device Cane  Distance Ambulated (ft) 280 ft  LLE Weight Bearing Per Provider Order WBAT  Activity Response Tolerated well  Mobility Referral Yes  Mobility visit 1 Mobility  Mobility Specialist Start Time (ACUTE ONLY) 0900  Mobility Specialist Stop Time (ACUTE ONLY) 0915  Mobility Specialist Time Calculation (min) (ACUTE ONLY) 15 min   Pt received in bed, eager for mobility session. CGA via cane required for safety. Tolerated well, c/o fatigue and feeling "winded" upon return to room. Requested to sit up in chair. Left pt with all needs met, call bell in reach.   Feliciana Rossetti Mobility Specialist Please contact via Special educational needs teacher or  Rehab office at [phone]

## 2023-04-25 NOTE — Plan of Care (Signed)

## 2023-04-25 NOTE — Progress Notes (Addendum)
  Progress Note    04/25/2023 7:57 AM 4 Days Post-Op  Subjective:  says he didn't sleep well. Has some intermittent sharp pains in the left foot    Vitals:   04/24/23 2355 04/25/23 0407  BP: 122/83 112/71  Pulse:  73  Resp:    Temp: 97.9 F (36.6 C) 98.4 F (36.9 C)  SpO2:  95%    Physical Exam: General:  sitting up eating breakfast Lungs:  nonlabored Incisions:  L groin prevena removed. L groin incision healing appropriately, no drainage, erythema or dehiscence. LLE incision clean and dry Extremities:  Left foot bandaged and dry. L PT signal above foot bandagin   CBC    Component Value Date/Time   WBC 10.8 (H) 04/25/2023 0313   RBC 4.16 (L) 04/25/2023 0313   HGB 12.0 (L) 04/25/2023 0313   HCT 36.7 (L) 04/25/2023 0313   PLT 355 04/25/2023 0313   MCV 88.2 04/25/2023 0313   MCH 28.8 04/25/2023 0313   MCHC 32.7 04/25/2023 0313   RDW 13.4 04/25/2023 0313   LYMPHSABS 3.0 04/25/2023 0313   MONOABS 0.8 04/25/2023 0313   EOSABS 0.3 04/25/2023 0313   BASOSABS 0.1 04/25/2023 0313    BMET    Component Value Date/Time   NA 138 04/25/2023 0313   K 4.6 04/25/2023 0313   CL 105 04/25/2023 0313   CO2 25 04/25/2023 0313   GLUCOSE 152 (H) 04/25/2023 0313   BUN 11 04/25/2023 0313   CREATININE 0.82 04/25/2023 0313   CALCIUM 8.9 04/25/2023 0313   GFRNONAA >60 04/25/2023 0313   GFRAA >60 03/16/2016 0859    INR    Component Value Date/Time   INR 1.0 05/31/2021 1117     Intake/Output Summary (Last 24 hours) at 04/25/2023 0757 Last data filed at 04/25/2023 0640 Gross per 24 hour  Intake 660 ml  Output 2200 ml  Net -1540 ml      Assessment/Plan:  63 y.o. male is 6 days post op, s/p: L femoral to TPT bypass    -L groin incisional vac removed this morning. L groin incision looks good and has no signs of infection. Will continue to keep this area dry with gauze -LLE well perfused with brisk PT doppler signal -Continue to mobilize in post op shoe -Ok for d/c from  vascular standpoint. He will follow up in a couple of weeks for incision check   Loel Dubonnet, PA-C [facility] [phone] 04/25/2023 7:57 AM

## 2023-04-25 NOTE — Progress Notes (Signed)
Physical Therapy Treatment Patient Details Name: Michael Chambers MRN: [id number] DOB: April 08, 1960 Today's Date: 04/25/2023   History of Present Illness Pt is a 63 y/o male admitted with gangrene of L toe. Pt  found to have occluded L superficial femo artery stent. Underwent L femoral to popliteal bypass and wound vac placement on 1/22. S/p L great toe amputation 1/24. PMH: CAD, depression, DM, GERD, hx of substance abuse, hx of suicide attempt, HTN, CAD.    PT Comments  Pt received in recliner, eager to participate in therapy. He required supervision transfers, and CGA amb 250' with SPC. Mildly unsteady with fatigue but no LOB or need for physical assist. Pt supine in bed at end of session.     If plan is discharge home, recommend the following: A little help with walking and/or transfers;Assist for transportation;Assistance with cooking/housework;Help with stairs or ramp for entrance   Can travel by private vehicle        Equipment Recommendations  BSC/3in1    Recommendations for Other Services       Precautions / Restrictions Precautions Precautions: Fall;Other (comment) Precaution Comments: blind L eye, impaired R hand function Restrictions LLE Weight Bearing Per Provider Order: Weight bearing as tolerated Other Position/Activity Restrictions: post op shoe     Mobility  Bed Mobility Overal bed mobility: Modified Independent                  Transfers Overall transfer level: Needs assistance Equipment used: Ambulation equipment used Transfers: Sit to/from Stand Sit to Stand: Supervision                Ambulation/Gait Ambulation/Gait assistance: Contact guard assist Gait Distance (Feet): 250 Feet Assistive device: Straight cane Gait Pattern/deviations: Step-through pattern, Decreased stance time - left, Decreased weight shift to left Gait velocity: decreased Gait velocity interpretation: 1.31 - 2.62 ft/sec, indicative of limited community ambulator    General Gait Details: mildly unsteady, decreased L foot clearance with fatigue   Stairs             Wheelchair Mobility     Tilt Bed    Modified Rankin (Stroke Patients Only)       Balance Overall balance assessment: Needs assistance Sitting-balance support: No upper extremity supported, Feet supported Sitting balance-Leahy Scale: Good     Standing balance support: Single extremity supported, During functional activity, Reliant on assistive device for balance Standing balance-Leahy Scale: Fair                              Cognition Arousal: Alert Behavior During Therapy: WFL for tasks assessed/performed Overall Cognitive Status: Within Functional Limits for tasks assessed                                          Exercises      General Comments        Pertinent Vitals/Pain Pain Assessment Pain Assessment: No/denies pain    Home Living                          Prior Function            PT Goals (current goals can now be found in the care plan section) Acute Rehab PT Goals Patient Stated Goal: home Progress towards PT goals: Progressing toward goals  Frequency    Min 1X/week      PT Plan      Co-evaluation              AM-PAC PT "6 Clicks" Mobility   Outcome Measure  Help needed turning from your back to your side while in a flat bed without using bedrails?: None Help needed moving from lying on your back to sitting on the side of a flat bed without using bedrails?: A Little Help needed moving to and from a bed to a chair (including a wheelchair)?: A Little Help needed standing up from a chair using your arms (e.g., wheelchair or bedside chair)?: A Little Help needed to walk in hospital room?: A Little Help needed climbing 3-5 steps with a railing? : A Little 6 Click Score: 19    End of Session Equipment Utilized During Treatment: Gait belt Activity Tolerance: Patient tolerated  treatment well Patient left: in bed;with call bell/phone within reach Nurse Communication: Mobility status PT Visit Diagnosis: Other abnormalities of gait and mobility (R26.89)     Time: 0955-1010 PT Time Calculation (min) (ACUTE ONLY): 15 min  Charges:    $Gait Training: 8-22 mins PT General Charges $$ ACUTE PT VISIT: 1 Visit                     Ferd Glassing., PT  Office # [phone]    Ilda Foil 04/25/2023, 12:13 PM
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
## 2023-04-26 DIAGNOSIS — L089 Local infection of the skin and subcutaneous tissue, unspecified: Secondary | ICD-10-CM | POA: Diagnosis not present

## 2023-04-26 DIAGNOSIS — I10 Essential (primary) hypertension: Secondary | ICD-10-CM | POA: Diagnosis not present

## 2023-04-26 DIAGNOSIS — E11628 Type 2 diabetes mellitus with other skin complications: Secondary | ICD-10-CM | POA: Diagnosis not present

## 2023-04-26 DIAGNOSIS — I739 Peripheral vascular disease, unspecified: Secondary | ICD-10-CM | POA: Diagnosis not present

## 2023-04-26 LAB — GLUCOSE, CAPILLARY
Glucose-Capillary: 162 mg/dL — ABNORMAL HIGH (ref 70–99)
Glucose-Capillary: 174 mg/dL — ABNORMAL HIGH (ref 70–99)
Glucose-Capillary: 132 mg/dL — ABNORMAL HIGH (ref 70–99)
Glucose-Capillary: 160 mg/dL — ABNORMAL HIGH (ref 70–99)

## 2023-04-26 LAB — COMPREHENSIVE METABOLIC PANEL WITH GFR
ALT: 23 U/L (ref 0–44)
AST: 25 U/L (ref 15–41)
Albumin: 3 g/dL — ABNORMAL LOW (ref 3.5–5.0)
Alkaline Phosphatase: 57 U/L (ref 38–126)
Anion gap: 12 (ref 5–15)
BUN: 13 mg/dL (ref 8–23)
CO2: 24 mmol/L (ref 22–32)
Calcium: 9.1 mg/dL (ref 8.9–10.3)
Chloride: 99 mmol/L (ref 98–111)
Creatinine, Ser: 0.93 mg/dL (ref 0.61–1.24)
GFR, Estimated: >60 mL/min (ref >60)
Glucose, Bld: 155 mg/dL — ABNORMAL HIGH (ref 70–99)
Potassium: 4.6 mmol/L (ref 3.5–5.1)
Sodium: 135 mmol/L (ref 135–145)
Total Bilirubin: 0.7 mg/dL (ref 0.0–1.2)
Total Protein: 6.4 g/dL — ABNORMAL LOW (ref 6.5–8.1)

## 2023-04-26 LAB — CBC WITH DIFFERENTIAL/PLATELET
Abs Immature Granulocytes: 0.22 10*3/uL — ABNORMAL HIGH (ref 0.00–0.07)
Basophils Absolute: 0.1 10*3/uL (ref 0.0–0.1)
Basophils Relative: 1 %
Eosinophils Absolute: 0.3 10*3/uL (ref 0.0–0.5)
Eosinophils Relative: 3 %
HCT: 40.3 % (ref 39.0–52.0)
Hemoglobin: 13.3 g/dL (ref 13.0–17.0)
Immature Granulocytes: 2 %
Lymphocytes Relative: 24 %
Lymphs Abs: 2.8 10*3/uL (ref 0.7–4.0)
MCH: 28.9 pg (ref 26.0–34.0)
MCHC: 33 g/dL (ref 30.0–36.0)
MCV: 87.4 fL (ref 80.0–100.0)
Monocytes Absolute: 0.9 10*3/uL (ref 0.1–1.0)
Monocytes Relative: 7 %
Neutro Abs: 7.4 10*3/uL (ref 1.7–7.7)
Neutrophils Relative %: 63 %
Platelets: 395 10*3/uL (ref 150–400)
RBC: 4.61 MIL/uL (ref 4.22–5.81)
RDW: 13.5 % (ref 11.5–15.5)
WBC: 11.7 10*3/uL — ABNORMAL HIGH (ref 4.0–10.5)
nRBC: 0 % (ref 0.0–0.2)

## 2023-04-26 LAB — MAGNESIUM: Magnesium: 2.2 mg/dL (ref 1.7–2.4)

## 2023-04-26 LAB — PHOSPHORUS: Phosphorus: 3.4 mg/dL (ref 2.5–4.6)

## 2023-04-26 NOTE — Progress Notes (Signed)
Pharmacy Antibiotic Note  Michael Chambers is a 63 y.o. male admitted on 04/16/2023 with  diabetic foot infection w/ gangrene .  Continues on Vancomycin, Cefepime, and Metronidazole.  -SCr stable, WBC remains elevated, afebrile -1/24 s/p partial left great toe amputation -1/24 left toe tissue cx: abundant GPC pairs on gram stain, few GNR; enterobacter cloacae noted (pan-sensitive); final cultures pending  Plan:  Continue vancomycin 1000 mg IV q12h (eAUC 440, Scr 0.8) Continue cefepime 2g IV q 8h Flagyl per MD Monitor renal function Follow up cx results and for antibiotic LOT   Height: 5\' 9"  (175.3 cm) Weight: 108.9 kg (240 lb) IBW/kg (Calculated) : 70.7  Temp (24hrs), Avg:97.7 F (36.5 C), Min:97.6 F (36.4 C), Max:97.9 F (36.6 C)  Recent Labs  Lab 04/22/23 0254 04/23/23 0251 04/24/23 0315 04/25/23 0313 04/26/23 0345  WBC 11.1* 11.5* 10.9* 10.8* 11.7*  CREATININE 0.80 0.87 0.91 0.82 0.93    Estimated Creatinine Clearance: 98.9 mL/min (by C-G formula based on SCr of 0.93 mg/dL).    Allergies  Allergen Reactions   Dimetapp Cold-Allergy [Brompheniramine-Phenylephrine]     hives   Brompheniramine Hives   Chlorpheniramine Hives   Dextromethorphan Hives   Ibuprofen Nausea And Vomiting   Ivp Dye [Iodinated Contrast Media] Itching   Thank you for involving pharmacy in this patient's care.  Loura Back, PharmD, BCPS Clinical Pharmacist Clinical phone for 04/26/2023 is [phone] 04/26/2023 9:43 AM

## 2023-04-26 NOTE — Progress Notes (Signed)
Mobility Specialist Progress Note:    04/26/23 1419  Mobility  Activity Ambulated with assistance in hallway  Level of Assistance Contact guard assist, steadying assist  Assistive Device None  Distance Ambulated (ft) 250 ft  LLE Weight Bearing Per Provider Order WBAT  Activity Response Tolerated well  Mobility Referral Yes  Mobility visit 1 Mobility  Mobility Specialist Start Time (ACUTE ONLY) 1410  Mobility Specialist Stop Time (ACUTE ONLY) 1419  Mobility Specialist Time Calculation (min) (ACUTE ONLY) 9 min   Pt received in bed, agreeable to mobility session. Ambulated in hallway with CGA, no AD required. Tolerated well, asx throughout. Returned pt to room, sitting up with all needs met.   Feliciana Rossetti Mobility Specialist Please contact via Special educational needs teacher or  Rehab office at [phone]

## 2023-04-26 NOTE — Progress Notes (Signed)
Physical Therapy Treatment Patient Details Name: Michael Chambers MRN: [id number] DOB: [birth year] Today's Date: 04/26/2023   History of Present Illness Pt is a 63 y/o male admitted with gangrene of L toe. Pt  found to have occluded L superficial femo artery stent. Underwent L femoral to popliteal bypass and wound vac placement on 1/22. S/p L first ray amputation 1/24. PMH: CAD, depression, DM, GERD, hx of substance abuse, hx of suicide attempt, HTN, CAD.    PT Comments  Pt in recliner upon arrival and agreeable to PT session. Worked on gait training, LE strength, and stair negotiation in today's session. Pt was able to ambulate ~200 ft with CGA for safety and SP cane. Pt has increased steadiness when using SP cane and reports feeling more comfortable with UE support. Pt was able to ascend/descend 1 step with CGA for safety. Pt was able to demonstrate ability to don/doff post-op shoe with verbal cues. Pt is progressing well towards goals. Acute PT to follow.      If plan is discharge home, recommend the following: A little help with walking and/or transfers;Assist for transportation;Assistance with cooking/housework;Help with stairs or ramp for entrance   Can travel by private vehicle      Yes  Equipment Recommendations  BSC/3in1       Precautions / Restrictions Precautions Precautions: Fall;Other (comment) Precaution Comments: blind L eye, impaired R hand function Restrictions Weight Bearing Restrictions Per Provider Order: Yes LLE Weight Bearing Per Provider Order: Weight bearing as tolerated Other Position/Activity Restrictions: post op shoe     Mobility  Bed Mobility    General bed mobility comments: in recliner upon arrival, EOB at end of session    Transfers Overall transfer level: Needs assistance Equipment used: None Transfers: Sit to/from Stand Sit to Stand: Supervision    General transfer comment: supervision for safety    Ambulation/Gait Ambulation/Gait  assistance: Contact guard assist Gait Distance (Feet): 200 Feet Assistive device: Straight cane Gait Pattern/deviations: Step-through pattern, Decreased stance time - left, Decreased weight shift to left Gait velocity: decreased     General Gait Details: steady with no LOB, decreased L foot clearance after ~150 ft   Stairs Stairs: Yes Stairs assistance: Contact guard assist Stair Management: One rail Right, Forwards, Sideways, With cane Number of Stairs: 1 General stair comments: 1 UE on R handrail with cane in L UE, CGA for safety. Forward step to pattern to ascend, sideways step to pattern to descend     Balance Overall balance assessment: Needs assistance, Mild deficits observed, not formally tested Sitting-balance support: No upper extremity supported, Feet supported Sitting balance-Leahy Scale: Good     Standing balance support: Single extremity supported, During functional activity, Reliant on assistive device for balance Standing balance-Leahy Scale: Fair Standing balance comment: able to stand with no AD, prefers SP cane with ambulation     Cognition Arousal: Alert Behavior During Therapy: WFL for tasks assessed/performed Overall Cognitive Status: Within Functional Limits for tasks assessed      Exercises General Exercises - Lower Extremity Long Arc Quad: AROM, Both, 10 reps, Seated Hip Flexion/Marching: AROM, Both, 5 reps, Seated (limited L hip flexion due to pain) Other Exercises Other Exercises: x5 STS with no AD, CGA for safety    General Comments General comments (skin integrity, edema, etc.): HR 120 BPM with activity, RN notified      Pertinent Vitals/Pain Pain Assessment Pain Assessment: No/denies pain     PT Goals (current goals can now be found in the  care plan section) Acute Rehab PT Goals PT Goal Formulation: With patient Time For Goal Achievement: 05/04/23 Potential to Achieve Goals: Good Progress towards PT goals: Progressing toward goals     Frequency    Min 1X/week       AM-PAC PT "6 Clicks" Mobility   Outcome Measure  Help needed turning from your back to your side while in a flat bed without using bedrails?: None Help needed moving from lying on your back to sitting on the side of a flat bed without using bedrails?: A Little Help needed moving to and from a bed to a chair (including a wheelchair)?: A Little Help needed standing up from a chair using your arms (e.g., wheelchair or bedside chair)?: A Little Help needed to walk in hospital room?: A Little Help needed climbing 3-5 steps with a railing? : A Little 6 Click Score: 19    End of Session Equipment Utilized During Treatment: Gait belt (post-op shoe) Activity Tolerance: Patient tolerated treatment well Patient left: in bed;with call bell/phone within reach Nurse Communication: Mobility status (HR) PT Visit Diagnosis: Other abnormalities of gait and mobility (R26.89)     Time: 1610-9604 PT Time Calculation (min) (ACUTE ONLY): 17 min  Charges:    $Therapeutic Activity: 8-22 mins PT General Charges $$ ACUTE PT VISIT: 1 Visit                    Michael Chambers, PT, DPT Secure Chat Preferred  Rehab Office [phone]   Michael Chambers 04/26/2023, 4:37 PM

## 2023-04-26 NOTE — Progress Notes (Signed)
PROGRESS NOTE    Michael Chambers  WUJ:[id number] DOB: 1960-04-17 DOA: 04/16/2023 PCP: Kirstie Peri, MD   Brief Narrative:  Michael Chambers is a 63 y.o. male with medical history significant of hypertension, atrial flutter, carotid artery disease status post left endarterectomy, PAD status post Pham pop stenting in 2023, CAD, CRAO, diabetes, cocaine use presenting from [facility] with left great toe gangrene.   Vascular surgery and podiatry were consulted and patient underwent a abdominal aortogram and bilateral lower extremity angiogram which showed bilateral femoral artery occlusion.    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 No cortical destruction identified to suggest osteomyelitis. -Patient was seen by vascular surgery and underwent a ultrasound-guided right femoral and abdominal aortogram bilateral lower extremity angiogram which showed bilateral femoral artery occlusion; subsequently given his occlusion patient was scheduled for a left femoral to below the knee popliteal artery bypass graft. -Underwent total amputation on Friday, 04/21/2023 with podiatry and  Clopidogrel had been held but now resumed  -Repeat Foot X-Ray done and showed "Status post resection great toe and distal half the first metatarsal on the left. No evidence of an operative complication. No evidence of residual osteomyelitis" Cultures revealed Enterobacter cloacae.  However there was also gram-positive cocci seen but that identification is not available yet.  Patient remains on broad-spectrum coverage with vancomycin metronidazole and cefepime. Previous rounding MD discussed with ID who recommended waiting for final culture report before deciding on antibiotics which will either be oral Bactrim or oral ciprofloxacin for 2 weeks. Noted to be afebrile with improvement in  WBC. -PT OT recommending Home Health -Per Podiatry "Okay to weight-bear as tolerated in surgical shoe for short distances and for transfers. Instructed patient to favor heel. He is requesting to work further with PT OT on safe weightbearing." Cleared by vascular surgery for discharge.  Await final culture report.   Essential hypertension Noted to be on both lisinopril and losartan prior to admission.  Currently just on lisinopril.  Blood pressure is reasonably well-controlled.   Atrial Flutter -Heart rate stable. Not on rate/rhythm/nor anticoagulation meds -Continue to Monitor on Telemetry    Carotid Artery Disease -Status post left carotid endarterectomy -Vascular surgery has started the patient on baby aspirin 81 mg p.o. daily and his Clopidogrel has been resumed as well -Continue Rosuvastatin 10 mg p.o. nightly   PAD -Status post Left femoral-popliteal angioplasty and stent placement in 2023 and is now status post left femoral endarterectomy and left femoral to below knee popliteal bypass with PTFE Continue antiplatelet agents.   CAD -Home Plavix had been held but now resumed; resumed Home ASA 81 mg po Daily  -Continue Rosuvastatin 20 mg po at bedtime -Lipid panel done and showed a total cholesterol/HDL ratio of 3.3, cholesterol level 102, HDL 31, LDL 56, triglycerides of 76, VLDL 15  Hyponatremia Appears to have resolved   Type II Diabetes Mellitus -Blood sugars controlled. -HbA1c was 7.6  ABLA/Normocytic Anemia -Checked Anemia Panel and showed an iron level of 26, UIBC of 223, TIBC of 249, saturation of 10%, ferritin level 125, folate level 8.0 and vitamin B12 170 -Initiated B12 supplementation  Class II Obesity -Complicates overall prognosis and care -Estimated body mass index is 35.44 kg/m as calculated from the following:   Height as of this encounter: 5\' 9"  (1.753 m).   Weight as of this encounter: 108.9 kg.  -  Weight Loss and Dietary Counseling given and Exercise  recommended    DVT prophylaxis: Subcutaneous heparin   Code Status: Full Code Family Communication: No family currently at bedside  Disposition Plan: Await final culture report before deciding on transition to oral antibiotics.   Consultants:  Vascular Surgery Podiatry Discussed with ID  Procedures:  As delineated as above  Antimicrobials:  Anti-infectives (From admission, onward)    Start     Dose/Rate Route Frequency Ordered Stop   04/21/23 1515  ceFEPIme (MAXIPIME) 2 g in sodium chloride 0.9 % 100 mL IVPB        2 g 200 mL/hr over 30 Minutes Intravenous Every 8 hours 04/21/23 1503     04/18/23 2200  metroNIDAZOLE (FLAGYL) tablet 500 mg        500 mg Oral Every 12 hours 04/18/23 1336     04/16/23 2000  metroNIDAZOLE (FLAGYL) IVPB 500 mg  Status:  Discontinued        500 mg 100 mL/hr over 60 Minutes Intravenous Every 12 hours 04/16/23 1729 04/18/23 1336   04/16/23 1900  ceFEPIme (MAXIPIME) 2 g in sodium chloride 0.9 % 100 mL IVPB  Status:  Discontinued        2 g 200 mL/hr over 30 Minutes Intravenous Every 12 hours 04/16/23 1751 04/21/23 1503   04/16/23 1900  vancomycin (VANCOCIN) IVPB 1000 mg/200 mL premix        1,000 mg 200 mL/hr over 60 Minutes Intravenous Every 12 hours 04/16/23 1751         Subjective: Feels well.  Occasional pain in the foot.  Looking forward to going home soon.  Objective: Vitals:   04/25/23 1959 04/25/23 2324 04/26/23 0405 04/26/23 0829  BP: 109/73 114/85 104/75 117/79  Pulse: 88 74 84 80  Resp: 18 16 16 14   Temp: 97.7 F (36.5 C) 97.7 F (36.5 C) 97.8 F (36.6 C) 97.9 F (36.6 C)  TempSrc: Oral Oral Oral Oral  SpO2: 96% 97% 96% 92%  Weight:      Height:        Intake/Output Summary (Last 24 hours) at 04/26/2023 1047 Last data filed at 04/26/2023 0403 Gross per 24 hour  Intake --  Output 2100 ml  Net -2100 ml   Filed Weights   04/16/23 1639 04/19/23 0840 04/21/23 0640  Weight: 109.3 kg 108.9 kg 108.9 kg    Examination:  General appearance: Awake alert.  In no distress Resp: Clear to auscultation bilaterally.  Normal effort Cardio: S1-S2 is normal regular.  No S3-S4.  No rubs murmurs or bruit GI: Abdomen is soft.  Nontender nondistended.  Bowel sounds are present normal.  No masses organomegaly  Data Reviewed: I have personally reviewed following labs and imaging studies  CBC: Recent Labs  Lab 04/22/23 0254 04/23/23 0251 04/24/23 0315 04/25/23 0313 04/26/23 0345  WBC 11.1* 11.5* 10.9* 10.8* 11.7*  NEUTROABS 7.5 7.8* 7.1 6.5 7.4  HGB 11.7* 11.7* 11.8* 12.0* 13.3  HCT 35.4* 34.8* 36.6* 36.7* 40.3  MCV 87.6 85.9 88.2 88.2 87.4  PLT 291 330 347 355 395   Basic Metabolic Panel: Recent Labs  Lab 04/22/23 0254 04/23/23 0251 04/24/23 0315 04/25/23 0313 04/26/23 0345  NA 136 135 135 138 135  K 4.0 4.1 4.4 4.6 4.6  CL 103 102 102 105 99  CO2 25 23 24 25 24   GLUCOSE 141* 143* 165* 152* 155*  BUN 8 11 12 11 13   CREATININE 0.80 0.87 0.91 0.82 0.93  CALCIUM 8.5*  8.8* 8.7* 8.9 9.1  MG 2.0 2.1 2.1 2.1 2.2  PHOS 3.0 2.8 3.2 3.5 3.4   GFR: Estimated Creatinine Clearance: 98.9 mL/min (by C-G formula based on SCr of 0.93 mg/dL).  Liver Function Tests: Recent Labs  Lab 04/22/23 0254 04/23/23 0251 04/24/23 0315 04/25/23 0313 04/26/23 0345  AST 11* 14* 17 21 25   ALT 15 15 17 20 23   ALKPHOS 45 52 53 47 57  BILITOT 0.7 0.7 0.5 0.5 0.7  PROT 5.7* 6.0* 6.0* 5.9* 6.4*  ALBUMIN 2.9* 2.8* 2.7* 2.8* 3.0*   Recent Results (from the past 240 hours)  Surgical pcr screen     Status: None   Collection Time: 04/18/23 11:38 PM   Specimen: Nasal Mucosa; Nasal Swab  Result Value Ref Range Status   MRSA, PCR NEGATIVE NEGATIVE Final   Staphylococcus aureus NEGATIVE NEGATIVE Final    Comment: (NOTE) The Xpert SA Assay (FDA approved for NASAL specimens in patients 70 years of age and older), is one component of a comprehensive surveillance program. It is not intended to diagnose infection  nor to guide or monitor treatment. Performed at [facility], [address], [place], [place] [address]   Aerobic/Anaerobic Culture w Gram Stain (surgical/deep wound)     Status: None (Preliminary result)   Collection Time: 04/21/23  8:02 AM   Specimen: Toe, Left; Amputation  Result Value Ref Range Status   Specimen Description TISSUE  Final   Special Requests LEFT TOE PARTIAL AMP  Final   Gram Stain   Final    NO WBC SEEN ABUNDANT GRAM POSITIVE COCCI IN PAIRS FEW GRAM NEGATIVE RODS Performed at [facility], [address] [place], [place] [address]    Culture   Final    FEW ENTEROBACTER CLOACAE FEW ENTEROCOCCUS FAECALIS SUSCEPTIBILITIES TO FOLLOW NO ANAEROBES ISOLATED; CULTURE IN PROGRESS FOR 5 DAYS    Report Status PENDING  Incomplete   Organism ID, Bacteria ENTEROBACTER CLOACAE  Final      Susceptibility   Enterobacter cloacae - MIC*    CEFEPIME <=0.12 SENSITIVE Sensitive     CEFTAZIDIME <=1 SENSITIVE Sensitive     CIPROFLOXACIN <=0.25 SENSITIVE Sensitive     GENTAMICIN <=1 SENSITIVE Sensitive     IMIPENEM 0.5 SENSITIVE Sensitive     TRIMETH/SULFA <=20 SENSITIVE Sensitive     PIP/TAZO <=4 SENSITIVE Sensitive ug/mL    * FEW ENTEROBACTER CLOACAE    Radiology Studies: No results found.  Scheduled Meds:  aspirin EC  81 mg Oral Q0600   clopidogrel  75 mg Oral Daily   vitamin B-12  1,000 mcg Oral Daily   DULoxetine  60 mg Oral Daily   gabapentin  100 mg Oral BID   heparin  5,000 Units Subcutaneous Q8H   insulin aspart  0-15 Units Subcutaneous TID WC   lisinopril  20 mg Oral Daily   metroNIDAZOLE  500 mg Oral Q12H   nicotine  21 mg Transdermal Daily   pantoprazole  40 mg Oral Daily   rosuvastatin  20 mg Oral QHS   sodium chloride flush  3 mL Intravenous Q12H   sodium chloride flush  3 mL Intravenous Q12H   Continuous Infusions:  sodium chloride     ceFEPime (MAXIPIME) IV 2 g (04/26/23 1019)   magnesium sulfate bolus IVPB     vancomycin 1,000 mg  (04/26/23 0615)    LOS: 10 days   Elzia Hott  [facility]  04/26/2023, 10:47 AM

## 2023-04-26 NOTE — Progress Notes (Addendum)
  Progress Note    04/26/2023 7:34 AM 5 Days Post-Op  Subjective:  says he still has some intermittent shooting pains in the left foot    Vitals:   04/25/23 2324 04/26/23 0405  BP: 114/85 104/75  Pulse: 74 84  Resp: 16 16  Temp: 97.7 F (36.5 C) 97.8 F (36.6 C)  SpO2: 97% 96%    Physical Exam: General:  sitting up in bed eating breakfast, NAD Lungs:  nonlabored Incisions:  L groin incision well appearing, dry gauze dressing changed. L below knee incision clean and dry Extremities:  L foot bandaged and in darco shoe. Brisk PT signal above bandage   CBC    Component Value Date/Time   WBC 11.7 (H) 04/26/2023 0345   RBC 4.61 04/26/2023 0345   HGB 13.3 04/26/2023 0345   HCT 40.3 04/26/2023 0345   PLT 395 04/26/2023 0345   MCV 87.4 04/26/2023 0345   MCH 28.9 04/26/2023 0345   MCHC 33.0 04/26/2023 0345   RDW 13.5 04/26/2023 0345   LYMPHSABS 2.8 04/26/2023 0345   MONOABS 0.9 04/26/2023 0345   EOSABS 0.3 04/26/2023 0345   BASOSABS 0.1 04/26/2023 0345    BMET    Component Value Date/Time   NA 135 04/26/2023 0345   K 4.6 04/26/2023 0345   CL 99 04/26/2023 0345   CO2 24 04/26/2023 0345   GLUCOSE 155 (H) 04/26/2023 0345   BUN 13 04/26/2023 0345   CREATININE 0.93 04/26/2023 0345   CALCIUM 9.1 04/26/2023 0345   GFRNONAA >60 04/26/2023 0345   GFRAA >60 03/16/2016 0859    INR    Component Value Date/Time   INR 1.0 05/31/2021 1117     Intake/Output Summary (Last 24 hours) at 04/26/2023 0734 Last data filed at 04/26/2023 0403 Gross per 24 hour  Intake --  Output 2100 ml  Net -2100 ml      Assessment/Plan:  63 y.o. male is 7 days post op, s/p: L femoral to TPT trunk bypass   -L groin incision continues to heal appropriately. No drainage on gauze dressing. New dry gauze dressing applied -Left below knee incision looks great, no signs of infection -LLE well perfused with brisk PT doppler signal -Seems like he may be able to go home today pending final  wound cultures. He is clear to d/c from vascular standpoint. He has a follow up scheduled with our office on 2/17   Loel Dubonnet, [facility] [phone] 04/26/2023 7:34 AM  I agree with the above.  I have seen and evaluated the patient.  His incisions are healing nicely.  He has an excellent posterior tibial Doppler signal.  He is stable for discharge from a vascular perspective.  Durene Cal

## 2023-04-26 NOTE — Progress Notes (Signed)
Mobility Specialist Progress Note:   04/26/23 0940  Mobility  Activity Ambulated with assistance in room;Ambulated with assistance to bathroom  Level of Assistance Modified independent, requires aide device or extra time  Assistive Device Other (Comment) (IV pole)  Distance Ambulated (ft) 12 ft  LLE Weight Bearing Per Provider Order WBAT  Activity Response Tolerated well  Mobility Referral Yes  Mobility visit 1 Mobility  Mobility Specialist Start Time (ACUTE ONLY) 0930  Mobility Specialist Stop Time (ACUTE ONLY) 0940  Mobility Specialist Time Calculation (min) (ACUTE ONLY) 10 min   Pt received ambulating from bathroom. Politely declined ambulating in hallway at this time d/t fatigue from [facility], will f/u at later time. Ambulated to chair, sitting comfortably with all needs met, call bell in reach.   Feliciana Rossetti Mobility Specialist Please contact via Special educational needs teacher or  Rehab office at [phone]

## 2023-04-27 ENCOUNTER — Encounter: Payer: Medicare HMO | Admitting: Podiatry

## 2023-04-27 ENCOUNTER — Other Ambulatory Visit (HOSPITAL_COMMUNITY): Payer: Self-pay

## 2023-04-27 DIAGNOSIS — I96 Gangrene, not elsewhere classified: Secondary | ICD-10-CM | POA: Diagnosis not present

## 2023-04-27 LAB — AEROBIC/ANAEROBIC CULTURE W GRAM STAIN (SURGICAL/DEEP WOUND): Gram Stain: NONE SEEN

## 2023-04-27 LAB — GLUCOSE, CAPILLARY: Glucose-Capillary: 151 mg/dL — ABNORMAL HIGH (ref 70–99)

## 2023-04-27 MED ORDER — AMOXICILLIN-POT CLAVULANATE 875-125 MG PO TABS
1.0000 | ORAL_TABLET | Freq: Two times a day (BID) | ORAL | 0 refills | Status: AC
  Filled 2023-04-27: qty 18, 9d supply, fill #0

## 2023-04-27 MED ORDER — SACCHAROMYCES BOULARDII 250 MG PO CAPS
250.0000 mg | ORAL_CAPSULE | Freq: Two times a day (BID) | ORAL | Status: DC
  Administered 2023-04-27: 250 mg via ORAL
  Filled 2023-04-27: qty 1

## 2023-04-27 MED ORDER — ASPIRIN 81 MG PO TBEC
81.0000 mg | DELAYED_RELEASE_TABLET | Freq: Every day | ORAL | 1 refills | Status: AC
  Filled 2023-04-27: qty 30, 30d supply, fill #0

## 2023-04-27 MED ORDER — CYANOCOBALAMIN 1000 MCG PO TABS
1000.0000 ug | ORAL_TABLET | Freq: Every day | ORAL | 0 refills | Status: DC
  Filled 2023-04-27: qty 30, 30d supply, fill #0

## 2023-04-27 MED ORDER — LISINOPRIL 10 MG PO TABS
20.0000 mg | ORAL_TABLET | Freq: Every day | ORAL | 0 refills | Status: AC
  Filled 2023-04-27: qty 60, 30d supply, fill #0

## 2023-04-27 MED ORDER — AMOXICILLIN-POT CLAVULANATE 875-125 MG PO TABS
1.0000 | ORAL_TABLET | Freq: Two times a day (BID) | ORAL | Status: DC
  Administered 2023-04-27: 1 via ORAL
  Filled 2023-04-27: qty 1

## 2023-04-27 MED ORDER — CLOPIDOGREL BISULFATE 75 MG PO TABS
75.0000 mg | ORAL_TABLET | Freq: Every day | ORAL | 0 refills | Status: AC
  Filled 2023-04-27: qty 30, 30d supply, fill #0

## 2023-04-27 MED ORDER — PANTOPRAZOLE SODIUM 40 MG PO TBEC
40.0000 mg | DELAYED_RELEASE_TABLET | Freq: Every day | ORAL | 0 refills | Status: DC
  Filled 2023-04-27: qty 14, 14d supply, fill #0

## 2023-04-27 MED ORDER — ACIDOPHILUS LACTOBACILLUS PO CAPS
1.0000 | ORAL_CAPSULE | Freq: Two times a day (BID) | ORAL | 0 refills | Status: AC
  Filled 2023-04-27: qty 28, 14d supply, fill #0

## 2023-04-27 MED ORDER — HYALURONIDASE HUMAN 150 UNIT/ML IJ SOLN
150.0000 [IU] | Freq: Once | INTRAMUSCULAR | Status: AC
  Administered 2023-04-27: 150 [IU] via SUBCUTANEOUS
  Filled 2023-04-27: qty 1

## 2023-04-27 MED ORDER — CIPROFLOXACIN HCL 500 MG PO TABS
500.0000 mg | ORAL_TABLET | Freq: Two times a day (BID) | ORAL | 0 refills | Status: AC
  Filled 2023-04-27: qty 18, 9d supply, fill #0

## 2023-04-27 MED ORDER — CIPROFLOXACIN HCL 500 MG PO TABS
500.0000 mg | ORAL_TABLET | Freq: Two times a day (BID) | ORAL | Status: DC
  Administered 2023-04-27: 500 mg via ORAL
  Filled 2023-04-27: qty 1

## 2023-04-27 MED ORDER — ROSUVASTATIN CALCIUM 20 MG PO TABS
20.0000 mg | ORAL_TABLET | Freq: Every day | ORAL | 0 refills | Status: AC
  Filled 2023-04-27: qty 30, 30d supply, fill #0

## 2023-04-27 NOTE — Progress Notes (Signed)
Pt called out stating that his IV was stinging like a bee. Assessed site. Left arm red and swollen from IV Vanc.(Completed)    Disconnected KVO from IV. Charted required documentation for infiltration. Notified pharmacy. Dr. Janalyn Shy notified.  Order for hyaluronidase. Pt arm elevated with mission sling to help with swelling. Warm compresses applied.  Will continue to monitor

## 2023-04-27 NOTE — Discharge Summary (Signed)
[facility]  Physician Discharge Summary   Patient ID: Michael Chambers MRN: [id number] DOB/AGE: 1961/03/15 63 y.o.  Admit date: 04/16/2023 Discharge date: 04/27/2023    PCP: Kirstie Peri, MD  DISCHARGE DIAGNOSES:    Gangrene of toe of left foot [facility])   Essential hypertension   Coronary artery disease   Typical atrial flutter (HCC)   Stenosis of left carotid artery   Cocaine abuse (HCC)   Peripheral arterial disease (HCC)   Type 2 diabetes mellitus (HCC)   Diabetic foot infection (HCC)   Class 2 obesity in adult   RECOMMENDATIONS FOR OUTPATIENT FOLLOW UP: Vascular surgery to arrange outpatient follow-up   Home Health: PT/OT Equipment/Devices: None  CODE STATUS: Full code  DISCHARGE CONDITION: fair  Diet recommendation: Modified carbohydrate  INITIAL HISTORY: Michael Chambers is a 63 y.o. male with medical history significant of hypertension, atrial flutter, carotid artery disease status post left endarterectomy, PAD status post Pham pop stenting in 2023, CAD, CRAO, diabetes, cocaine use presenting from [facility] with left great toe gangrene.    Vascular surgery and podiatry were consulted and patient underwent a abdominal aortogram and bilateral lower extremity angiogram which showed bilateral femoral artery occlusion.    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 No cortical destruction identified to suggest osteomyelitis. -Patient was seen by vascular surgery and underwent a ultrasound-guided right femoral and abdominal aortogram bilateral lower extremity angiogram which showed bilateral femoral artery occlusion; subsequently given his occlusion patient was scheduled for a left femoral to below the knee popliteal artery bypass graft. -Underwent total amputation on Friday, 04/21/2023 with podiatry and  Clopidogrel had been  held but now resumed  -Repeat Foot X-Ray done and showed "Status post resection great toe and distal half the first metatarsal on the left. No evidence of an operative complication. No evidence of residual osteomyelitis" Cultures revealed Enterobacter cloacae and subsequently Enterococcus faecalis.  Sensitivities reviewed.  Patient will be discharged on Augmentin and ciprofloxacin. Noted to be afebrile with improvement in WBC. -PT OT recommending Home Health -Per Podiatry "Okay to weight-bear as tolerated in surgical shoe for short distances and for transfers. Instructed patient to favor heel. He is requesting to work further with PT OT on safe weightbearing." Cleared by vascular surgery for discharge.    Essential hypertension   Atrial Flutter -Heart rate stable. Not on rate/rhythm/nor anticoagulation meds   Carotid Artery Disease -Status post left carotid endarterectomy -Vascular surgery has started the patient on baby aspirin 81 mg p.o. daily and his Clopidogrel has been resumed as well -Continue Rosuvastatin 10 mg p.o. nightly   PAD -Status post Left femoral-popliteal angioplasty and stent placement in 2023 and is now status post left femoral endarterectomy and left femoral to below knee popliteal bypass with PTFE Continue antiplatelet agents.   CAD -Home Plavix had been held but now resumed; resumed Home ASA 81 mg po Daily  -Continue Rosuvastatin 20 mg po at bedtime -Lipid panel done and showed a total cholesterol/HDL ratio of 3.3, cholesterol level 102, HDL 31, LDL 56, triglycerides of 76, VLDL 15   Hyponatremia Appears to have resolved   Type II Diabetes Mellitus -Blood sugars controlled. -HbA1c was 7.6   ABLA/Normocytic Anemia -Checked Anemia Panel and showed an iron level of 26, UIBC of 223, TIBC of 249, saturation of 10%, ferritin level 125, folate level 8.0 and vitamin B12  170 -Initiated B12 supplementation   Class II Obesity -Complicates overall prognosis and  care -Estimated body mass index is 35.44 kg/m as calculated from the following:   Height as of this encounter: 5\' 9"  (1.753 m).   Weight as of this encounter: 108.9 kg.  -Weight Loss and Dietary Counseling given and Exercise recommended     Patient is stable.  Okay for discharge home today.   PERTINENT LABS:  The results of significant diagnostics from this hospitalization (including imaging, microbiology, ancillary and laboratory) are listed below for reference.    Microbiology: Recent Results (from the past 240 hours)  Surgical pcr screen     Status: None   Collection Time: 04/18/23 11:38 PM   Specimen: Nasal Mucosa; Nasal Swab  Result Value Ref Range Status   MRSA, PCR NEGATIVE NEGATIVE Final   Staphylococcus aureus NEGATIVE NEGATIVE Final    Comment: (NOTE) The Xpert SA Assay (FDA approved for NASAL specimens in patients 9 years of age and older), is one component of a comprehensive surveillance program. It is not intended to diagnose infection nor to guide or monitor treatment. Performed at [facility], [address] [place], [place] [address]   Aerobic/Anaerobic Culture w Gram Stain (surgical/deep wound)     Status: None   Collection Time: 04/21/23  8:02 AM   Specimen: Toe, Left; Amputation  Result Value Ref Range Status   Specimen Description TISSUE  Final   Special Requests LEFT TOE PARTIAL AMP  Final   Gram Stain   Final    NO WBC SEEN ABUNDANT GRAM POSITIVE COCCI IN PAIRS FEW GRAM NEGATIVE RODS    Culture   Final    FEW ENTEROBACTER CLOACAE FEW ENTEROCOCCUS FAECALIS NO ANAEROBES ISOLATED Performed at [facility], [address], [place], [place] [address]    Report Status 04/27/2023 FINAL  Final   Organism ID, Bacteria ENTEROBACTER CLOACAE  Final   Organism ID, Bacteria ENTEROCOCCUS FAECALIS  Final      Susceptibility   Enterobacter cloacae - MIC*    CEFEPIME <=0.12 SENSITIVE Sensitive     CEFTAZIDIME <=1 SENSITIVE Sensitive      CIPROFLOXACIN <=0.25 SENSITIVE Sensitive     GENTAMICIN <=1 SENSITIVE Sensitive     IMIPENEM 0.5 SENSITIVE Sensitive     TRIMETH/SULFA <=20 SENSITIVE Sensitive     PIP/TAZO <=4 SENSITIVE Sensitive ug/mL    * FEW ENTEROBACTER CLOACAE   Enterococcus faecalis - MIC*    AMPICILLIN <=2 SENSITIVE Sensitive     VANCOMYCIN 1 SENSITIVE Sensitive     GENTAMICIN SYNERGY SENSITIVE Sensitive     * FEW ENTEROCOCCUS FAECALIS     Labs:   Basic Metabolic Panel: Recent Labs  Lab 04/22/23 0254 04/23/23 0251 04/24/23 0315 04/25/23 0313 04/26/23 0345  NA 136 135 135 138 135  K 4.0 4.1 4.4 4.6 4.6  CL 103 102 102 105 99  CO2 25 23 24 25 24   GLUCOSE 141* 143* 165* 152* 155*  BUN 8 11 12 11 13   CREATININE 0.80 0.87 0.91 0.82 0.93  CALCIUM 8.5* 8.8* 8.7* 8.9 9.1  MG 2.0 2.1 2.1 2.1 2.2  PHOS 3.0 2.8 3.2 3.5 3.4   Liver Function Tests: Recent Labs  Lab 04/22/23 0254 04/23/23 0251 04/24/23 0315 04/25/23 0313 04/26/23 0345  AST 11* 14* 17 21 25   ALT 15 15 17 20 23   ALKPHOS 45 52 53 47 57  BILITOT 0.7 0.7 0.5 0.5 0.7  PROT 5.7* 6.0* 6.0* 5.9* 6.4*  ALBUMIN 2.9* 2.8* 2.7* 2.8* 3.0*    CBC: Recent Labs  Lab 04/22/23 0254 04/23/23 0251 04/24/23 0315 04/25/23 0313 04/26/23 0345  WBC 11.1* 11.5* 10.9* 10.8* 11.7*  NEUTROABS 7.5 7.8* 7.1 6.5 7.4  HGB 11.7* 11.7* 11.8* 12.0* 13.3  HCT 35.4* 34.8* 36.6* 36.7* 40.3  MCV 87.6 85.9 88.2 88.2 87.4  PLT 291 330 347 355 395     CBG: Recent Labs  Lab 04/26/23 0618 04/26/23 1232 04/26/23 1643 04/26/23 2143 04/27/23 0613  GLUCAP 162* 174* 132* 160* 151*     IMAGING STUDIES DG Foot 2 Views Left Result Date: 04/21/2023 CLINICAL DATA:  Postop. EXAM: LEFT FOOT - 2 VIEW COMPARISON:  04/15/2023. FINDINGS: Great toe has been resected along with the distal half of the first metatarsal. Previous resection of most of the distal phalanx of the third toe. Chronic resorption of distal aspect of the proximal phalanx of the fifth toe and  adjacent portions of the middle phalanx. No evidence active osteomyelitis. Remaining joints are normally spaced and aligned. IMPRESSION: 1. Status post resection great toe and distal half the first metatarsal on the left. No evidence of an operative complication. No evidence of residual osteomyelitis. Electronically Signed   By: Amie Portland M.D.   On: 04/21/2023 12:30   PERIPHERAL VASCULAR CATHETERIZATION Result Date: 04/18/2023 Images from the original result were not included. [name] MRN: [id number] DOB: [birth year] Sex: male 04/18/2023 Pre-operative Diagnosis: Left foot gangrene Post-operative diagnosis:  Same Surgeon:  Durene Cal Procedure Performed:  1.  Ultrasound-guided access, right femoral artery  2.  Abdominal aortogram  3.  Catheter selection into the left external iliac artery (second-order)  4.  Bilateral lower extremity angiogram  5.  Conscious sedation, 15 minutes  6.  Closure device, Mynx Indications: This is a 64 year old gentleman with wounds to his left foot.  He has previously undergone stenting of the left superficial femoral and popliteal artery which were occluded on ultrasound.  He comes in today for further evaluation. Procedure:  The patient was identified in the holding area and taken to room 8.  The patient was then placed supine on the table and prepped and draped in the usual sterile fashion.  A time out was called.  Conscious sedation was administered with the use of IV fentanyl and Versed under continuous physician and nurse monitoring.  Heart rate, blood pressure, and oxygen saturation were continuously monitored.  Total sedation time was 15 minutes.  Ultrasound was used to evaluate the right common femoral artery.  It was patent .  A digital ultrasound image was acquired.  A micropuncture needle was used to access the right common femoral artery under ultrasound guidance.  An 018 wire was advanced without resistance and a micropuncture sheath was placed.   The 018 wire was removed and a benson wire was placed.  The micropuncture sheath was exchanged for a 5 french sheath.  An omniflush catheter was advanced over the wire to the level of L-1.  An abdominal angiogram was obtained.  Next, using the omniflush catheter and a benson wire, the aortic bifurcation was crossed and the catheter was placed into theleft external iliac artery and left runoff was obtained.  right runoff was performed via retrograde sheath injections. Findings:  Aortogram: No significant renal artery stenosis was visualized.  The infrarenal abdominal aorta is widely patent.  Bilateral common and external iliac arteries are widely patent.  Right Lower Extremity: The right common femoral and profundofemoral artery are widely  patent.  The superficial femoral artery has a short segment occlusion in the adductor canal.  Popliteal artery reconstitutes with two-vessel runoff via the posterior tibial anterior tibial artery  Left Lower Extremity: Left common femoral and profundofemoral artery are widely patent.  The superficial femoral artery is patent proximally however occludes within the previously placed stents.  There is reconstitution of the distal below-knee popliteal artery with three-vessel runoff Intervention: None.  The groin was closed with a Mynx Impression:  #1  Bilateral superficial femoral artery occlusion.  #2  Patient will be scheduled for a left femoral to below-knee popliteal artery bypass graft tomorrow. Juleen China, M.D., [facility] [place] Office: [phone] Pager:  [phone]   MR FOOT LEFT W WO CONTRAST Result Date: 04/18/2023 CLINICAL DATA:  Soft tissue infection suspected. EXAM: MRI OF THE LEFT FOREFOOT WITHOUT AND WITH CONTRAST TECHNIQUE: Multiplanar, multisequence MR imaging of the left forefoot was performed both before and after administration of intravenous contrast. CONTRAST:  10mL GADAVIST GADOBUTROL 1 MMOL/ML IV SOLN COMPARISON:   Radiographs 04/15/2023.  MRI 06/01/2021. FINDINGS: Technical note: Despite efforts by the technologist and patient, mild motion artifact is present on today's exam and could not be eliminated. This reduces exam sensitivity and specificity. Bones/Joint/Cartilage Bone detail limited by motion. In correlation with the recent radiographs, there is soft tissue emphysema within the great toe. There is suspected intraosseous extension of gas into the distal phalanx, best seen on the coronal images. No cortical destruction identified. There is no suspicious marrow T2 signal or enhancement. The distal phalanx of the 2nd toe appears absent, unchanged from recent radiographs and previous MRI. The head of the 5th proximal phalanx appears chronically eroded. There are mild degenerative changes at the 1st metatarsophalangeal joint. No significant joint effusions or suspicious synovial enhancement. Ligaments Intact Lisfranc ligament. The collateral ligaments of the metatarsophalangeal joints appear intact. Muscles and Tendons No evidence of acute tendon tear or significant tenosynovitis. Chronic muscular atrophy and T2 hyperintensity attributed to underlying diabetes. Soft tissues Soft tissue ulceration along the dorsal and medial aspects of the great toe with underlying soft tissue swelling and soft tissue emphysema. No focal fluid collections identified. Soft tissue enhancement extends proximally to the level of the metatarsophalangeal joint. IMPRESSION: 1. Soft tissue ulceration along the dorsal and medial aspects of the great toe with underlying soft tissue swelling and soft tissue emphysema. Findings are suspicious for necrotizing soft tissue infection. 2. Suspected intraosseous extension of gas into the distal phalanx, best seen on the coronal images. No cortical destruction identified to suggest osteomyelitis. 3. No focal fluid collections identified. 4. Chronic muscular atrophy and T2 hyperintensity attributed to  underlying diabetes. Electronically Signed   By: Carey Bullocks M.D.   On: 04/18/2023 08:13   [place] EKG SITE RITE Result Date: 04/17/2023 If Site Rite image not attached, placement could not be confirmed due to current cardiac rhythm.  VAS [place] ABI WITH/WO TBI Result Date: 04/17/2023  LOWER EXTREMITY DOPPLER STUDY Patient Name:  Michael Chambers  Date of Exam:   04/17/2023 Medical Rec #: [id number]       Accession #:    [id number] Date of Birth: 11-22-1960      Patient Gender: M Patient Age:   63 years Exam Location:  [facility] Procedure:      VAS [place] ABI WITH/WO TBI Referring Phys: Sherald Hess --------------------------------------------------------------------------------  Indications: Gangrene. High Risk         Hypertension, Diabetes, current smoker, coronary  artery Factors:          disease. Other Factors: Atrial flutter, history of cocaine abuse.  Vascular Interventions: Left femoral-popliteal angio with SFA stent 08/13/21,                         Left CEA 06/04/21. Limitations: Today's exam was limited due to Restricted right upper extremity,              tachycardia, gangrene of left great toe/bandages. Comparison Study: Prior ABI done 09/16/21 Performing Technologist: Sherren Kerns RVS  Examination Guidelines: A complete evaluation includes at minimum, Doppler waveform signals and systolic blood pressure reading at the level of bilateral brachial, anterior tibial, and posterior tibial arteries, when vessel segments are accessible. Bilateral testing is considered an integral part of a complete examination. Photoelectric Plethysmograph (PPG) waveforms and toe systolic pressure readings are included as required and additional duplex testing as needed. Limited examinations for reoccurring indications may be performed as noted.  ABI Findings: +---------+------------------+-----+-----------+----------+ Right    Rt Pressure (mmHg)IndexWaveform   Comment     +---------+------------------+-----+-----------+----------+ Brachial                                   Restricted +---------+------------------+-----+-----------+----------+ PTA      128               0.81 multiphasic           +---------+------------------+-----+-----------+----------+ DP       94                0.59 monophasic            +---------+------------------+-----+-----------+----------+ Great Toe96                0.61 Abnormal              +---------+------------------+-----+-----------+----------+ +---------+------------------+-----+-------------------+----------------+ Left     Lt Pressure (mmHg)IndexWaveform           Comment          +---------+------------------+-----+-------------------+----------------+ Brachial 158                    multiphasic                         +---------+------------------+-----+-------------------+----------------+ PTA      69                0.44 monophasic                          +---------+------------------+-----+-------------------+----------------+ DP       63                0.40 dampened monophasic                 +---------+------------------+-----+-------------------+----------------+ Great Toe                       Abnormal           3rd toe waveform +---------+------------------+-----+-------------------+----------------+ +-------+-----------+-------------------------+------------+------------+ ABI/TBIToday's ABIToday's TBI              Previous ABIPrevious TBI +-------+-----------+-------------------------+------------+------------+ Right  0.81       0.61                     0.69  0.43         +-------+-----------+-------------------------+------------+------------+ Left   0.44       abnormal 3rd toe waveform1.0         0.76         +-------+-----------+-------------------------+------------+------------+ Right ABIs and TBIs appear increased compared to prior study on 09/16/21. Left  ABIs and TBIs appear decreased compared to prior study on 09/16/21.  Summary: Right: Resting right ankle-brachial index indicates mild right lower extremity arterial disease. The right toe-brachial index is abnormal. Left: Resting left ankle-brachial index indicates severe left lower extremity arterial disease. *See table(s) above for measurements and observations.  Electronically signed by Carolynn Sayers on 04/17/2023 at 2:07:17 PM.    Final    VAS [place] LOWER EXTREMITY ARTERIAL DUPLEX Result Date: 04/17/2023 LOWER EXTREMITY ARTERIAL DUPLEX STUDY Patient Name:  Michael Chambers  Date of Exam:   04/17/2023 Medical Rec #: [id number]       Accession #:    [id number] Date of Birth: 05/12/1960      Patient Gender: M Patient Age:   24 years Exam Location:  [facility] Procedure:      VAS [place] LOWER EXTREMITY ARTERIAL DUPLEX Referring Phys: Sherald Hess --------------------------------------------------------------------------------  Indications: Gangrene, and peripheral artery disease. High Risk Factors: Hypertension, Diabetes, current smoker, coronary artery                    disease. Other Factors: Atrial flutter, history of cocaine abuse.  Vascular Interventions: Left femoral-popliteal angio with SFA stent 08/13/21,                         Left CEA 06/04/21. Current ABI:            R:0.81/0.61 L:0.44/gangrene of great toe Comparison Study: Prior left LEA done 09/16/21 Performing Technologist: Sherren Kerns RVS  Examination Guidelines: A complete evaluation includes B-mode imaging, spectral Doppler, color Doppler, and power Doppler as needed of all accessible portions of each vessel. Bilateral testing is considered an integral part of a complete examination. Limited examinations for reoccurring indications may be performed as noted.  +--------+--------+-----+--------+--------+--------+ RIGHT   PSV cm/sRatioStenosisWaveformComments +--------+--------+-----+--------+--------+--------+ PTA Prox14                                     +--------+--------+-----+--------+--------+--------+ PTA Mid 14                                    +--------+--------+-----+--------+--------+--------+  +----------+--------+-----+--------------+-------------------+-----------------+ LEFT      PSV cm/sRatioStenosis      Waveform           Comments          +----------+--------+-----+--------------+-------------------+-----------------+ CFA Prox  251          30-49%        biphasic                                                    stenosis                                           +----------+--------+-----+--------------+-------------------+-----------------+  CFA Mid   204          30-49%        monophasic                                                  stenosis                                           +----------+--------+-----+--------------+-------------------+-----------------+ POP Prox               occluded                                           +----------+--------+-----+--------------+-------------------+-----------------+ POP Distal             occluded                                           +----------+--------+-----+--------------+-------------------+-----------------+ ATA Mid   12                         dampened monophasic                  +----------+--------+-----+--------------+-------------------+-----------------+ ATA Distal13                         dampened monophasic                  +----------+--------+-----+--------------+-------------------+-----------------+ PTA Prox  14                         dampened monophasic                  +----------+--------+-----+--------------+-------------------+-----------------+ PTA Mid   14                         dampened monophasiccollaterals noted +----------+--------+-----+--------------+-------------------+-----------------+ PTA Distal15                         dampened monophasic                   +----------+--------+-----+--------------+-------------------+-----------------+  Left Stent(s): +---------------+-------+--------+-----------------------------------+---------+ SFA            PSV    StenosisWaveform                           Comments                 cm/s                                                        +---------------+-------+--------+-----------------------------------+---------+ Prox to Stent  52             biphasic                                     +---------------+-------+--------+-----------------------------------+---------+  Proximal Stent 104            spiked past the origin, then       shadowing                               occluded                                     +---------------+-------+--------+-----------------------------------+---------+ Mid Stent             occluded                                             +---------------+-------+--------+-----------------------------------+---------+ Distal Stent          occluded                                             +---------------+-------+--------+-----------------------------------+---------+ Distal to Stent       occluded                                             +---------------+-------+--------+-----------------------------------+---------+    Summary: Left: 30-49% stenosis noted in the common femoral artery. Stenosis is noted within the Occluded left SFA stent just past the origin stent.  See table(s) above for measurements and observations. Electronically signed by Carolynn Sayers on 04/17/2023 at 2:06:40 PM.    Final     DISCHARGE EXAMINATION: Vitals:   04/26/23 2016 04/26/23 2334 04/27/23 0241 04/27/23 0823  BP: 118/75 113/86 (!) 102/46 107/72  Pulse: 84 97 80 80  Resp: 13 15 16 18   Temp: 97.7 F (36.5 C) 97.9 F (36.6 C) 97.7 F (36.5 C) 98.6 F (37 C)  TempSrc: Oral Oral Oral Oral  SpO2: 96% 96% 98% 98%  Weight:      Height:        General appearance: Awake alert.  In no distress Resp: Clear to auscultation bilaterally.  Normal effort Cardio: S1-S2 is normal regular.  No S3-S4.  No rubs murmurs or bruit GI: Abdomen is soft.  Nontender nondistended.  Bowel sounds are present normal.  No masses organomegaly Extremities: No edema.  Full range of motion of lower extremities.   DISPOSITION: Home  Discharge Instructions     Call MD for:  difficulty breathing, headache or visual disturbances   Complete by: As directed    Call MD for:  extreme fatigue   Complete by: As directed    Call MD for:  persistant dizziness or light-headedness   Complete by: As directed    Call MD for:  persistant nausea and vomiting   Complete by: As directed    Call MD for:  severe uncontrolled pain   Complete by: As directed    Call MD for:  temperature >100.4   Complete by: As directed    Diet - low sodium heart healthy   Complete by: As directed    Discharge instructions   Complete by: As directed    Please take your medications as prescribed.  Follow-up with the  vascular surgery clinic as scheduled.  You were cared for by a hospitalist during your hospital stay. If you have any questions about your discharge medications or the care you received while you were in the hospital after you are discharged, you can call the unit and asked to speak with the hospitalist on call if the hospitalist that took care of you is not available. Once you are discharged, your primary care physician will handle any further medical issues. Please note that NO REFILLS for any discharge medications will be authorized once you are discharged, as it is imperative that you return to your primary care physician (or establish a relationship with a primary care physician if you do not have one) for your aftercare needs so that they can reassess your need for medications and monitor your lab values. If you do not have a primary care physician, you can call [phone] for  a physician referral.   Increase activity slowly   Complete by: As directed    No wound care   Complete by: As directed          Allergies as of 04/27/2023       Reactions   Dimetapp Cold-allergy [brompheniramine-phenylephrine]    hives   Brompheniramine Hives   Chlorpheniramine Hives   Dextromethorphan Hives   Ibuprofen Nausea And Vomiting   Ivp Dye [iodinated Contrast Media] Itching        Medication List     STOP taking these medications    hydrocerin Crea   topiramate 25 MG tablet Commonly known as: TOPAMAX       TAKE these medications    Acidophilus Caps capsule Take 1 capsule by mouth 2 (two) times daily for 14 days.   amoxicillin-clavulanate 875-125 MG tablet Commonly known as: AUGMENTIN Take 1 tablet by mouth every 12 (twelve) hours for 9 days.   aspirin EC 81 MG tablet Take 1 tablet (81 mg total) by mouth daily at 6 (six) AM. Swallow whole.   ciprofloxacin 500 MG tablet Commonly known as: CIPRO Take 1 tablet (500 mg total) by mouth 2 (two) times daily for 9 days.   clopidogrel 75 MG tablet Commonly known as: PLAVIX Take 1 tablet (75 mg total) by mouth daily at 6 (six) AM.   cyanocobalamin 1000 MCG tablet Commonly known as: VITAMIN B12 Take 1 tablet (1,000 mcg total) by mouth daily.   DULoxetine 60 MG capsule Commonly known as: CYMBALTA Take 60 mg by mouth daily.   gabapentin 100 MG capsule Commonly known as: NEURONTIN Take 100 mg by mouth 2 (two) times daily.   HYDROcodone-acetaminophen 5-325 MG tablet Commonly known as: NORCO/VICODIN Take 1 tablet by mouth 2 (two) times daily.   lisinopril 10 MG tablet Commonly known as: ZESTRIL Take 2 tablets (20 mg total) by mouth daily. What changed:  medication strength how much to take   pantoprazole 40 MG tablet Commonly known as: PROTONIX Take 1 tablet (40 mg total) by mouth daily for 14 days.   rosuvastatin 20 MG tablet Commonly known as: Crestor Take 1 tablet (20 mg total) by  mouth at bedtime.          Follow-up Information     [facility] at [facility] Follow up in 3 week(s).   Specialty: Vascular Surgery Contact information: [address] [place] [address] [phone]        Care, [facility] Follow up.   Specialty: Home Health Services Why: HHPT/OT arranged- they will  contact you to schedule Contact information: [address] [place] [address] [phone]                 TOTAL DISCHARGE TIME: 35 minutes  Laiah Pouncey  [facility] Pager on [email or web address]  04/28/2023, 12:12 PM

## 2023-04-27 NOTE — Progress Notes (Signed)
Pt requested for the mission sling to be removed.

## 2023-04-27 NOTE — Plan of Care (Signed)
Problem: Clinical Measurements: Goal: Will remain free from infection Outcome: Progressing Goal: Respiratory complications will improve Outcome: Progressing

## 2023-04-27 NOTE — Care Management Important Message (Signed)
Important Message  Patient Details  Name: Michael Chambers MRN: [id number] Date of Birth: 23-Jul-1960   Important Message Given:  Yes - Medicare IM     Renie Ora 04/27/2023, 10:55 AM

## 2023-04-27 NOTE — Progress Notes (Signed)
Orthopedic Tech Progress Note Patient Details:  Michael Chambers 19-Feb-1961 [id number]  Ortho Devices Type of Ortho Device: Sling arm elevator Ortho Device/Splint Location: lue Ortho Device/Splint Interventions: Ordered, Application, Adjustment   Post Interventions Patient Tolerated: Well Instructions Provided: Care of device, Adjustment of device  Trinna Post 04/27/2023, 2:33 AM

## 2023-04-27 NOTE — Progress Notes (Signed)
  Patient's RN informed to pharmacy that patient had infiltration of vancomycin site which is red and swollen. Discussed with pharmacy planning to give hyaluronidase for local infiltration from vancomycin.  Tereasa Coop, MD [facility] 04/27/2023, 2:00 AM

## 2023-04-27 NOTE — Progress Notes (Signed)
Mobility Specialist Progress Note:   04/27/23 0945  Mobility  Activity Ambulated with assistance in hallway  Level of Assistance Contact guard assist, steadying assist  Assistive Device None  Distance Ambulated (ft) 250 ft  LLE Weight Bearing Per Provider Order WBAT  Activity Response Tolerated well  Mobility Referral Yes  Mobility visit 1 Mobility  Mobility Specialist Start Time (ACUTE ONLY) 0945  Mobility Specialist Stop Time (ACUTE ONLY) 0955  Mobility Specialist Time Calculation (min) (ACUTE ONLY) 10 min   Pt received in bed, agreeable to mobility session. Ambulated in hallway with CGA, no AD required. Tolerated well, asx throughout, WBAT for LLE. Returned pt to room, sitting up in chair with call bell in reach.    Feliciana Rossetti Mobility Specialist Please contact via Special educational needs teacher or  Rehab office at [phone]

## 2023-04-27 NOTE — TOC Transition Note (Signed)
Transition of Care (TOC) - Discharge Note Donn Pierini RN, BSN Transitions of Care Unit 4E- RN Case Manager See Treatment Team for direct phone #   Patient Details  Name: Michael Chambers MRN: [id number] Date of Birth: 25-Oct-1960  Transition of Care [facility]) CM/SW Contact:  Darrold Span, RN Phone Number: 04/27/2023, 11:52 AM   Clinical Narrative:    Pt stable for transition home today Orders placed for HHPT/OT.   CM in to speak with pt at bedside, Per pt he lives w/ room-mate, has sister that assist and will come transport home. Discussed therapy recommendations for [facility] and DME needs. Per pt he has canes at home and voiced he does not want RW or BSC- recommended DME declined.   Pt voiced he is open to [facility]- list provided for choice- Per CMS guidelines from [email or web address] website with star ratings (copy placed in shadow chart)- pt shared that his insurance is changing on 2/1 to [facility] Medicare. - after review of list- pt has chosen [facility] as first choice- does not have a preference should [facility] not accept referral.   Address, phone # (currently not working) and PCP confirmed- pt requesting that his sister be contacted for [facility] scheduling needs.   Call made to [facility]- referral has been accepted for HHPT/OT   No further TOC needs noted   Final next level of care: Home w Home Health Services Barriers to Discharge: No Barriers Identified   Patient Goals and CMS Choice Patient states their goals for this hospitalization and ongoing recovery are:: return home CMS Medicare.gov Compare Post Acute Care list provided to:: Patient Choice offered to / list presented to : Patient      Discharge Placement                 Home w/ [facility]      Discharge Plan and Services Additional resources added to the After Visit Summary for     Discharge Planning Services: CM Consult Post Acute Care Choice: Durable Medical Equipment, Home Health          DME Arranged: Patient refused  services DME Agency: NA       HH Arranged: PT, OT HH Agency: [facility] Date [facility] Agency Contacted: 04/27/23 Time HH Agency Contacted: 1151 Representative spoke with at [facility] Agency: Kandee Keen  Social Drivers of Health (SDOH) Interventions SDOH Screenings   Food Insecurity: Patient Unable To Answer (04/16/2023)  Recent Concern: Food Insecurity - Food Insecurity Present (04/16/2023)   Received from [facility]  Housing: Low Risk  (04/16/2023)  Transportation Needs: No Transportation Needs (04/16/2023)  Recent Concern: Transportation Needs - Unmet Transportation Needs (04/16/2023)   Received from [facility]  Utilities: Patient Unable To Answer (04/16/2023)  Financial Resource Strain: High Risk (04/16/2023)   Received from [facility]  Physical Activity: Inactive (04/16/2023)   Received from [facility]  Social Connections: Unknown (04/16/2023)  Recent Concern: Social Connections - Socially Isolated (04/16/2023)   Received from [facility]  Stress: Stress Concern Present (04/16/2023)   Received from [facility]  Tobacco Use: High Risk (04/21/2023)  Health Literacy: High Risk (08/08/2021)   Received from [facility]     Readmission Risk Interventions    04/27/2023   11:52 AM  Readmission Risk Prevention Plan  Transportation Screening Complete  Home Care Screening Complete  Medication Review (RN CM) Complete

## 2023-05-02 ENCOUNTER — Ambulatory Visit (INDEPENDENT_AMBULATORY_CARE_PROVIDER_SITE_OTHER): Payer: 59 | Admitting: Podiatry

## 2023-05-02 ENCOUNTER — Emergency Department (HOSPITAL_BASED_OUTPATIENT_CLINIC_OR_DEPARTMENT_OTHER): Payer: 59

## 2023-05-02 ENCOUNTER — Encounter (HOSPITAL_COMMUNITY): Payer: Self-pay

## 2023-05-02 ENCOUNTER — Emergency Department (HOSPITAL_COMMUNITY)
Admission: EM | Admit: 2023-05-02 | Discharge: 2023-05-02 | Discharge status: Against Medical Advice | Payer: 59 | Attending: Emergency Medicine | Admitting: Emergency Medicine

## 2023-05-02 ENCOUNTER — Other Ambulatory Visit: Payer: Self-pay

## 2023-05-02 DIAGNOSIS — Z89412 Acquired absence of left great toe: Secondary | ICD-10-CM

## 2023-05-02 DIAGNOSIS — M7989 Other specified soft tissue disorders: Secondary | ICD-10-CM | POA: Diagnosis not present

## 2023-05-02 DIAGNOSIS — Z4781 Encounter for orthopedic aftercare following surgical amputation: Secondary | ICD-10-CM | POA: Diagnosis not present

## 2023-05-02 DIAGNOSIS — I2510 Atherosclerotic heart disease of native coronary artery without angina pectoris: Secondary | ICD-10-CM | POA: Diagnosis not present

## 2023-05-02 DIAGNOSIS — E1151 Type 2 diabetes mellitus with diabetic peripheral angiopathy without gangrene: Secondary | ICD-10-CM | POA: Diagnosis not present

## 2023-05-02 DIAGNOSIS — Z9181 History of falling: Secondary | ICD-10-CM | POA: Diagnosis not present

## 2023-05-02 DIAGNOSIS — Z860100 Personal history of colon polyps, unspecified: Secondary | ICD-10-CM | POA: Diagnosis not present

## 2023-05-02 DIAGNOSIS — E7849 Other hyperlipidemia: Secondary | ICD-10-CM | POA: Diagnosis not present

## 2023-05-02 DIAGNOSIS — Z48812 Encounter for surgical aftercare following surgery on the circulatory system: Secondary | ICD-10-CM | POA: Diagnosis not present

## 2023-05-02 DIAGNOSIS — Z7902 Long term (current) use of antithrombotics/antiplatelets: Secondary | ICD-10-CM | POA: Diagnosis not present

## 2023-05-02 DIAGNOSIS — F17210 Nicotine dependence, cigarettes, uncomplicated: Secondary | ICD-10-CM | POA: Diagnosis not present

## 2023-05-02 DIAGNOSIS — R2242 Localized swelling, mass and lump, left lower limb: Secondary | ICD-10-CM | POA: Diagnosis not present

## 2023-05-02 DIAGNOSIS — Z5321 Procedure and treatment not carried out due to patient leaving prior to being seen by health care provider: Secondary | ICD-10-CM | POA: Insufficient documentation

## 2023-05-02 DIAGNOSIS — Z79891 Long term (current) use of opiate analgesic: Secondary | ICD-10-CM | POA: Diagnosis not present

## 2023-05-02 DIAGNOSIS — E559 Vitamin D deficiency, unspecified: Secondary | ICD-10-CM | POA: Diagnosis not present

## 2023-05-02 DIAGNOSIS — J449 Chronic obstructive pulmonary disease, unspecified: Secondary | ICD-10-CM | POA: Diagnosis not present

## 2023-05-02 DIAGNOSIS — I4892 Unspecified atrial flutter: Secondary | ICD-10-CM | POA: Diagnosis not present

## 2023-05-02 DIAGNOSIS — Z9889 Other specified postprocedural states: Secondary | ICD-10-CM

## 2023-05-02 DIAGNOSIS — K219 Gastro-esophageal reflux disease without esophagitis: Secondary | ICD-10-CM | POA: Diagnosis not present

## 2023-05-02 DIAGNOSIS — E1142 Type 2 diabetes mellitus with diabetic polyneuropathy: Secondary | ICD-10-CM

## 2023-05-02 DIAGNOSIS — S51801A Unspecified open wound of right forearm, initial encounter: Secondary | ICD-10-CM | POA: Diagnosis not present

## 2023-05-02 DIAGNOSIS — I10 Essential (primary) hypertension: Secondary | ICD-10-CM | POA: Diagnosis not present

## 2023-05-02 DIAGNOSIS — D62 Acute posthemorrhagic anemia: Secondary | ICD-10-CM | POA: Diagnosis not present

## 2023-05-02 DIAGNOSIS — Z7982 Long term (current) use of aspirin: Secondary | ICD-10-CM | POA: Diagnosis not present

## 2023-05-02 LAB — URINALYSIS, ROUTINE W REFLEX MICROSCOPIC
Bilirubin Urine: NEGATIVE
Glucose, UA: NEGATIVE mg/dL
Hgb urine dipstick: NEGATIVE
Ketones, ur: NEGATIVE mg/dL
Leukocytes,Ua: NEGATIVE
Nitrite: NEGATIVE
Protein, ur: 30 mg/dL — AB
Specific Gravity, Urine: 1.009 (ref 1.005–1.030)
pH: 7 (ref 5.0–8.0)

## 2023-05-02 LAB — CBC WITH DIFFERENTIAL/PLATELET
Abs Immature Granulocytes: 0.12 10*3/uL — ABNORMAL HIGH (ref 0.00–0.07)
Basophils Absolute: 0.1 10*3/uL (ref 0.0–0.1)
Basophils Relative: 0 %
Eosinophils Absolute: 0.5 10*3/uL (ref 0.0–0.5)
Eosinophils Relative: 3 %
HCT: 41.9 % (ref 39.0–52.0)
Hemoglobin: 13.4 g/dL (ref 13.0–17.0)
Immature Granulocytes: 1 %
Lymphocytes Relative: 20 %
Lymphs Abs: 3.3 10*3/uL (ref 0.7–4.0)
MCH: 29.1 pg (ref 26.0–34.0)
MCHC: 32 g/dL (ref 30.0–36.0)
MCV: 90.9 fL (ref 80.0–100.0)
Monocytes Absolute: 1 10*3/uL (ref 0.1–1.0)
Monocytes Relative: 6 %
Neutro Abs: 11.4 10*3/uL — ABNORMAL HIGH (ref 1.7–7.7)
Neutrophils Relative %: 70 %
Platelets: 499 10*3/uL — ABNORMAL HIGH (ref 150–400)
RBC: 4.61 MIL/uL (ref 4.22–5.81)
RDW: 14.1 % (ref 11.5–15.5)
WBC: 16.4 10*3/uL — ABNORMAL HIGH (ref 4.0–10.5)
nRBC: 0 % (ref 0.0–0.2)

## 2023-05-02 LAB — COMPREHENSIVE METABOLIC PANEL
ALT: 21 U/L (ref 0–44)
AST: 20 U/L (ref 15–41)
Albumin: 3.7 g/dL (ref 3.5–5.0)
Alkaline Phosphatase: 70 U/L (ref 38–126)
Anion gap: 13 (ref 5–15)
BUN: 7 mg/dL — ABNORMAL LOW (ref 8–23)
CO2: 23 mmol/L (ref 22–32)
Calcium: 9.3 mg/dL (ref 8.9–10.3)
Chloride: 102 mmol/L (ref 98–111)
Creatinine, Ser: 1.03 mg/dL (ref 0.61–1.24)
GFR, Estimated: >60 mL/min (ref >60)
Glucose, Bld: 120 mg/dL — ABNORMAL HIGH (ref 70–99)
Potassium: 4.3 mmol/L (ref 3.5–5.1)
Sodium: 138 mmol/L (ref 135–145)
Total Bilirubin: 0.5 mg/dL (ref 0.0–1.2)
Total Protein: 7.1 g/dL (ref 6.5–8.1)

## 2023-05-02 NOTE — ED Provider Triage Note (Signed)
 Emergency Medicine Provider Triage Evaluation Note  Michael Chambers , a 63 y.o. male  was evaluated in triage.  Pt complains of left leg swelling for the past couple days.  Reports he had a recent amputation couple weeks ago and his doctor wanted him to be evaluated for possible blood clot given the new swelling.  Denies any fever or chills.  Review of Systems  Positive: As above Negative: As above  Physical Exam  BP (!) 156/92   Pulse (!) 121   Temp 98.4 F (36.9 C)   Resp 19   Ht 5' 9 (1.753 m)   Wt 108.9 kg   SpO2 98%   BMI 35.44 kg/m  Gen:   Awake, no distress   Resp:  Normal effort  MSK:   Moves extremities without difficulty    Medical Decision Making  Medically screening exam initiated at 3:16 PM.  Appropriate orders placed.  [name] was informed that the remainder of the evaluation will be completed by another provider, this initial triage assessment does not replace that evaluation, and the importance of remaining in the ED until their evaluation is complete.     [name], PA-C 05/02/23 1519

## 2023-05-02 NOTE — ED Notes (Addendum)
 Michael Chambers

## 2023-05-02 NOTE — ED Triage Notes (Signed)
Pt states he was sent over here to check if he has blood clot in left leg. Leg has been swollen for a couple of days. Denies recent trips and denies fevers.

## 2023-05-02 NOTE — Progress Notes (Signed)
Lower extremity venous duplex completed. Please see CV Procedures for preliminary results.  Shona Simpson, RVT 05/02/23 4:38 PM

## 2023-05-02 NOTE — Progress Notes (Signed)
 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 male followed by podiatry status post left partial first ray resection 1/24 with partial wound closure.  He reports he is doing well he does notice a lot of swelling in his left leg today with concern for some pain in the calf as well.  Denies pain in the foot at the amputation site.  He is taking aspirin  but unclear if he has been able to take his Plavix  at this time.  Not taking Eliquis or Xarelto.       Latest Ref Rng & Units 04/26/2023    3:45 AM 04/25/2023    3:13 AM 04/24/2023    3:15 AM  CBC  WBC 4.0 - 10.5 K/uL 11.7  10.8  10.9   Hemoglobin 13.0 - 17.0 g/dL 86.6  87.9  88.1   Hematocrit 39.0 - 52.0 % 40.3  36.7  36.6   Platelets 150 - 400 K/uL 395  355  347        Latest Ref Rng & Units 04/26/2023    3:45 AM 04/25/2023    3:13 AM 04/24/2023    3:15 AM  BMP  Glucose 70 - 99 mg/dL 844  847  834   BUN 8 - 23 mg/dL 13  11  12    Creatinine 0.61 - 1.24 mg/dL 9.06  9.17  9.08   Sodium 135 - 145 mmol/L 135  138  135   Potassium 3.5 - 5.1 mmol/L 4.6  4.6  4.4   Chloride 98 - 111 mmol/L 99  105  102   CO2 22 - 32 mmol/L 24  25  24    Calcium  8.9 - 10.3 mg/dL 9.1  8.9  8.7       Physical Exam: General: AOD x 3, no acute distress   Left lower extremity focused physical exam: Foot appears warm and well perfused. Palpable pedal pulse. Amputation site healing well no dehiscence erythema edema or drainage  No ascending erythema or streaking lymphangitis.  No cyanosis or pallor to the remaining digits.  Cap refill intact to the digits There is some significant edema in the left lower leg and soft tissue feels firm to me.  Pain with side-to-side squeeze of the posterior calf Digital dexterity intact.  Epicritic sensation grossly intact.    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  POD # 10 s/p left partial first ray amputation -Progressing well in regards to the partial first ray resection on the left foot.  No evidence of complication at the amputation site. -I am concerned about the edema and pain in the left calf at this visit.  Patient is not anticoagulated at this time. -I recommend he proceed immediately to the emergency department at [facility] for rule out DVT with ultrasound. -XR: Deferred at this visit -WB Status: Weightbearing as tolerated in postop shoe to the left foot -Sutures: To remain intact 2 more weeks. -Medications/ABX: No further antibiotics indicated for left foot -Foot redressed.  Recommend once to twice weekly dressing  changes per home care.  I called the homecare nurse Elida to tell her the orders. - Return in 2 weeks

## 2023-05-03 ENCOUNTER — Telehealth: Payer: Self-pay

## 2023-05-03 NOTE — Telephone Encounter (Signed)
 Patient's wife is calling about the urgent U/S he had in the ED yesterday

## 2023-05-04 ENCOUNTER — Telehealth: Payer: Self-pay | Admitting: Podiatry

## 2023-05-04 DIAGNOSIS — I4892 Unspecified atrial flutter: Secondary | ICD-10-CM | POA: Diagnosis not present

## 2023-05-04 DIAGNOSIS — F17210 Nicotine dependence, cigarettes, uncomplicated: Secondary | ICD-10-CM | POA: Diagnosis not present

## 2023-05-04 DIAGNOSIS — I2510 Atherosclerotic heart disease of native coronary artery without angina pectoris: Secondary | ICD-10-CM | POA: Diagnosis not present

## 2023-05-04 DIAGNOSIS — Z860100 Personal history of colon polyps, unspecified: Secondary | ICD-10-CM | POA: Diagnosis not present

## 2023-05-04 DIAGNOSIS — I10 Essential (primary) hypertension: Secondary | ICD-10-CM | POA: Diagnosis not present

## 2023-05-04 DIAGNOSIS — Z48812 Encounter for surgical aftercare following surgery on the circulatory system: Secondary | ICD-10-CM | POA: Diagnosis not present

## 2023-05-04 DIAGNOSIS — K219 Gastro-esophageal reflux disease without esophagitis: Secondary | ICD-10-CM | POA: Diagnosis not present

## 2023-05-04 DIAGNOSIS — E559 Vitamin D deficiency, unspecified: Secondary | ICD-10-CM | POA: Diagnosis not present

## 2023-05-04 DIAGNOSIS — E1151 Type 2 diabetes mellitus with diabetic peripheral angiopathy without gangrene: Secondary | ICD-10-CM | POA: Diagnosis not present

## 2023-05-04 DIAGNOSIS — Z7902 Long term (current) use of antithrombotics/antiplatelets: Secondary | ICD-10-CM | POA: Diagnosis not present

## 2023-05-04 DIAGNOSIS — Z4781 Encounter for orthopedic aftercare following surgical amputation: Secondary | ICD-10-CM | POA: Diagnosis not present

## 2023-05-04 DIAGNOSIS — J449 Chronic obstructive pulmonary disease, unspecified: Secondary | ICD-10-CM | POA: Diagnosis not present

## 2023-05-04 DIAGNOSIS — Z79891 Long term (current) use of opiate analgesic: Secondary | ICD-10-CM | POA: Diagnosis not present

## 2023-05-04 DIAGNOSIS — Z89412 Acquired absence of left great toe: Secondary | ICD-10-CM | POA: Diagnosis not present

## 2023-05-04 DIAGNOSIS — Z7982 Long term (current) use of aspirin: Secondary | ICD-10-CM | POA: Diagnosis not present

## 2023-05-04 DIAGNOSIS — D62 Acute posthemorrhagic anemia: Secondary | ICD-10-CM | POA: Diagnosis not present

## 2023-05-04 DIAGNOSIS — S51801A Unspecified open wound of right forearm, initial encounter: Secondary | ICD-10-CM | POA: Diagnosis not present

## 2023-05-04 DIAGNOSIS — Z9181 History of falling: Secondary | ICD-10-CM | POA: Diagnosis not present

## 2023-05-04 DIAGNOSIS — E7849 Other hyperlipidemia: Secondary | ICD-10-CM | POA: Diagnosis not present

## 2023-05-04 NOTE — Telephone Encounter (Signed)
 Pt scheduled for 2/20 to see Dr Rosemarie Conquest

## 2023-05-04 NOTE — Telephone Encounter (Signed)
 Pts sister called and they had the u/s yesterday and the tech told them they needed and mri to be done due to infection seen on the u/s. She is asking if we could order a mri instead of them waiting in the ed for hours. Please advise

## 2023-05-05 DIAGNOSIS — I10 Essential (primary) hypertension: Secondary | ICD-10-CM | POA: Diagnosis not present

## 2023-05-05 DIAGNOSIS — J449 Chronic obstructive pulmonary disease, unspecified: Secondary | ICD-10-CM | POA: Diagnosis not present

## 2023-05-05 DIAGNOSIS — Z48812 Encounter for surgical aftercare following surgery on the circulatory system: Secondary | ICD-10-CM | POA: Diagnosis not present

## 2023-05-05 DIAGNOSIS — Z89412 Acquired absence of left great toe: Secondary | ICD-10-CM | POA: Diagnosis not present

## 2023-05-05 DIAGNOSIS — Z7902 Long term (current) use of antithrombotics/antiplatelets: Secondary | ICD-10-CM | POA: Diagnosis not present

## 2023-05-05 DIAGNOSIS — E559 Vitamin D deficiency, unspecified: Secondary | ICD-10-CM | POA: Diagnosis not present

## 2023-05-05 DIAGNOSIS — S51801A Unspecified open wound of right forearm, initial encounter: Secondary | ICD-10-CM | POA: Diagnosis not present

## 2023-05-05 DIAGNOSIS — Z9181 History of falling: Secondary | ICD-10-CM | POA: Diagnosis not present

## 2023-05-05 DIAGNOSIS — Z79891 Long term (current) use of opiate analgesic: Secondary | ICD-10-CM | POA: Diagnosis not present

## 2023-05-05 DIAGNOSIS — E1151 Type 2 diabetes mellitus with diabetic peripheral angiopathy without gangrene: Secondary | ICD-10-CM | POA: Diagnosis not present

## 2023-05-05 DIAGNOSIS — Z860100 Personal history of colon polyps, unspecified: Secondary | ICD-10-CM | POA: Diagnosis not present

## 2023-05-05 DIAGNOSIS — Z7982 Long term (current) use of aspirin: Secondary | ICD-10-CM | POA: Diagnosis not present

## 2023-05-05 DIAGNOSIS — I2510 Atherosclerotic heart disease of native coronary artery without angina pectoris: Secondary | ICD-10-CM | POA: Diagnosis not present

## 2023-05-05 DIAGNOSIS — E7849 Other hyperlipidemia: Secondary | ICD-10-CM | POA: Diagnosis not present

## 2023-05-05 DIAGNOSIS — Z4781 Encounter for orthopedic aftercare following surgical amputation: Secondary | ICD-10-CM | POA: Diagnosis not present

## 2023-05-05 DIAGNOSIS — I4892 Unspecified atrial flutter: Secondary | ICD-10-CM | POA: Diagnosis not present

## 2023-05-05 DIAGNOSIS — F17210 Nicotine dependence, cigarettes, uncomplicated: Secondary | ICD-10-CM | POA: Diagnosis not present

## 2023-05-05 DIAGNOSIS — K219 Gastro-esophageal reflux disease without esophagitis: Secondary | ICD-10-CM | POA: Diagnosis not present

## 2023-05-05 DIAGNOSIS — D62 Acute posthemorrhagic anemia: Secondary | ICD-10-CM | POA: Diagnosis not present

## 2023-05-09 DIAGNOSIS — Z7902 Long term (current) use of antithrombotics/antiplatelets: Secondary | ICD-10-CM | POA: Diagnosis not present

## 2023-05-09 DIAGNOSIS — Z860100 Personal history of colon polyps, unspecified: Secondary | ICD-10-CM | POA: Diagnosis not present

## 2023-05-09 DIAGNOSIS — Z79891 Long term (current) use of opiate analgesic: Secondary | ICD-10-CM | POA: Diagnosis not present

## 2023-05-09 DIAGNOSIS — Z48812 Encounter for surgical aftercare following surgery on the circulatory system: Secondary | ICD-10-CM | POA: Diagnosis not present

## 2023-05-09 DIAGNOSIS — E559 Vitamin D deficiency, unspecified: Secondary | ICD-10-CM | POA: Diagnosis not present

## 2023-05-09 DIAGNOSIS — I2510 Atherosclerotic heart disease of native coronary artery without angina pectoris: Secondary | ICD-10-CM | POA: Diagnosis not present

## 2023-05-09 DIAGNOSIS — D62 Acute posthemorrhagic anemia: Secondary | ICD-10-CM | POA: Diagnosis not present

## 2023-05-09 DIAGNOSIS — K219 Gastro-esophageal reflux disease without esophagitis: Secondary | ICD-10-CM | POA: Diagnosis not present

## 2023-05-09 DIAGNOSIS — Z9181 History of falling: Secondary | ICD-10-CM | POA: Diagnosis not present

## 2023-05-09 DIAGNOSIS — I4892 Unspecified atrial flutter: Secondary | ICD-10-CM | POA: Diagnosis not present

## 2023-05-09 DIAGNOSIS — Z89412 Acquired absence of left great toe: Secondary | ICD-10-CM | POA: Diagnosis not present

## 2023-05-09 DIAGNOSIS — Z4781 Encounter for orthopedic aftercare following surgical amputation: Secondary | ICD-10-CM | POA: Diagnosis not present

## 2023-05-09 DIAGNOSIS — E1151 Type 2 diabetes mellitus with diabetic peripheral angiopathy without gangrene: Secondary | ICD-10-CM | POA: Diagnosis not present

## 2023-05-09 DIAGNOSIS — F17210 Nicotine dependence, cigarettes, uncomplicated: Secondary | ICD-10-CM | POA: Diagnosis not present

## 2023-05-09 DIAGNOSIS — Z7982 Long term (current) use of aspirin: Secondary | ICD-10-CM | POA: Diagnosis not present

## 2023-05-09 DIAGNOSIS — S51801A Unspecified open wound of right forearm, initial encounter: Secondary | ICD-10-CM | POA: Diagnosis not present

## 2023-05-09 DIAGNOSIS — J449 Chronic obstructive pulmonary disease, unspecified: Secondary | ICD-10-CM | POA: Diagnosis not present

## 2023-05-09 DIAGNOSIS — E7849 Other hyperlipidemia: Secondary | ICD-10-CM | POA: Diagnosis not present

## 2023-05-09 DIAGNOSIS — I10 Essential (primary) hypertension: Secondary | ICD-10-CM | POA: Diagnosis not present

## 2023-05-11 DIAGNOSIS — Z7902 Long term (current) use of antithrombotics/antiplatelets: Secondary | ICD-10-CM | POA: Diagnosis not present

## 2023-05-11 DIAGNOSIS — Z89412 Acquired absence of left great toe: Secondary | ICD-10-CM | POA: Diagnosis not present

## 2023-05-11 DIAGNOSIS — Z7982 Long term (current) use of aspirin: Secondary | ICD-10-CM | POA: Diagnosis not present

## 2023-05-11 DIAGNOSIS — E7849 Other hyperlipidemia: Secondary | ICD-10-CM | POA: Diagnosis not present

## 2023-05-11 DIAGNOSIS — Z9181 History of falling: Secondary | ICD-10-CM | POA: Diagnosis not present

## 2023-05-11 DIAGNOSIS — S51801A Unspecified open wound of right forearm, initial encounter: Secondary | ICD-10-CM | POA: Diagnosis not present

## 2023-05-11 DIAGNOSIS — D62 Acute posthemorrhagic anemia: Secondary | ICD-10-CM | POA: Diagnosis not present

## 2023-05-11 DIAGNOSIS — Z4781 Encounter for orthopedic aftercare following surgical amputation: Secondary | ICD-10-CM | POA: Diagnosis not present

## 2023-05-11 DIAGNOSIS — K219 Gastro-esophageal reflux disease without esophagitis: Secondary | ICD-10-CM | POA: Diagnosis not present

## 2023-05-11 DIAGNOSIS — Z79891 Long term (current) use of opiate analgesic: Secondary | ICD-10-CM | POA: Diagnosis not present

## 2023-05-11 DIAGNOSIS — E559 Vitamin D deficiency, unspecified: Secondary | ICD-10-CM | POA: Diagnosis not present

## 2023-05-11 DIAGNOSIS — I4892 Unspecified atrial flutter: Secondary | ICD-10-CM | POA: Diagnosis not present

## 2023-05-11 DIAGNOSIS — Z860100 Personal history of colon polyps, unspecified: Secondary | ICD-10-CM | POA: Diagnosis not present

## 2023-05-11 DIAGNOSIS — Z48812 Encounter for surgical aftercare following surgery on the circulatory system: Secondary | ICD-10-CM | POA: Diagnosis not present

## 2023-05-11 DIAGNOSIS — E1151 Type 2 diabetes mellitus with diabetic peripheral angiopathy without gangrene: Secondary | ICD-10-CM | POA: Diagnosis not present

## 2023-05-11 DIAGNOSIS — F17210 Nicotine dependence, cigarettes, uncomplicated: Secondary | ICD-10-CM | POA: Diagnosis not present

## 2023-05-11 DIAGNOSIS — J449 Chronic obstructive pulmonary disease, unspecified: Secondary | ICD-10-CM | POA: Diagnosis not present

## 2023-05-11 DIAGNOSIS — I2510 Atherosclerotic heart disease of native coronary artery without angina pectoris: Secondary | ICD-10-CM | POA: Diagnosis not present

## 2023-05-11 DIAGNOSIS — I10 Essential (primary) hypertension: Secondary | ICD-10-CM | POA: Diagnosis not present

## 2023-05-12 DIAGNOSIS — I4892 Unspecified atrial flutter: Secondary | ICD-10-CM | POA: Diagnosis not present

## 2023-05-12 DIAGNOSIS — K219 Gastro-esophageal reflux disease without esophagitis: Secondary | ICD-10-CM | POA: Diagnosis not present

## 2023-05-12 DIAGNOSIS — Z860100 Personal history of colon polyps, unspecified: Secondary | ICD-10-CM | POA: Diagnosis not present

## 2023-05-12 DIAGNOSIS — Z7982 Long term (current) use of aspirin: Secondary | ICD-10-CM | POA: Diagnosis not present

## 2023-05-12 DIAGNOSIS — Z48812 Encounter for surgical aftercare following surgery on the circulatory system: Secondary | ICD-10-CM | POA: Diagnosis not present

## 2023-05-12 DIAGNOSIS — I2510 Atherosclerotic heart disease of native coronary artery without angina pectoris: Secondary | ICD-10-CM | POA: Diagnosis not present

## 2023-05-12 DIAGNOSIS — Z9181 History of falling: Secondary | ICD-10-CM | POA: Diagnosis not present

## 2023-05-12 DIAGNOSIS — S51801A Unspecified open wound of right forearm, initial encounter: Secondary | ICD-10-CM | POA: Diagnosis not present

## 2023-05-12 DIAGNOSIS — Z79891 Long term (current) use of opiate analgesic: Secondary | ICD-10-CM | POA: Diagnosis not present

## 2023-05-12 DIAGNOSIS — J449 Chronic obstructive pulmonary disease, unspecified: Secondary | ICD-10-CM | POA: Diagnosis not present

## 2023-05-12 DIAGNOSIS — E1151 Type 2 diabetes mellitus with diabetic peripheral angiopathy without gangrene: Secondary | ICD-10-CM | POA: Diagnosis not present

## 2023-05-12 DIAGNOSIS — I10 Essential (primary) hypertension: Secondary | ICD-10-CM | POA: Diagnosis not present

## 2023-05-12 DIAGNOSIS — Z4781 Encounter for orthopedic aftercare following surgical amputation: Secondary | ICD-10-CM | POA: Diagnosis not present

## 2023-05-12 DIAGNOSIS — Z7902 Long term (current) use of antithrombotics/antiplatelets: Secondary | ICD-10-CM | POA: Diagnosis not present

## 2023-05-12 DIAGNOSIS — F17210 Nicotine dependence, cigarettes, uncomplicated: Secondary | ICD-10-CM | POA: Diagnosis not present

## 2023-05-12 DIAGNOSIS — E7849 Other hyperlipidemia: Secondary | ICD-10-CM | POA: Diagnosis not present

## 2023-05-12 DIAGNOSIS — D62 Acute posthemorrhagic anemia: Secondary | ICD-10-CM | POA: Diagnosis not present

## 2023-05-12 DIAGNOSIS — E559 Vitamin D deficiency, unspecified: Secondary | ICD-10-CM | POA: Diagnosis not present

## 2023-05-12 DIAGNOSIS — Z89412 Acquired absence of left great toe: Secondary | ICD-10-CM | POA: Diagnosis not present

## 2023-05-15 ENCOUNTER — Ambulatory Visit (INDEPENDENT_AMBULATORY_CARE_PROVIDER_SITE_OTHER): Payer: 59 | Admitting: Physician Assistant

## 2023-05-15 VITALS — BP 132/86 | HR 107 | Temp 98.6°F | Ht 69.0 in | Wt 226.2 lb

## 2023-05-15 DIAGNOSIS — I70362 Atherosclerosis of unspecified type of bypass graft(s) of the extremities with gangrene, left leg: Secondary | ICD-10-CM | POA: Diagnosis not present

## 2023-05-15 DIAGNOSIS — Z89412 Acquired absence of left great toe: Secondary | ICD-10-CM | POA: Diagnosis not present

## 2023-05-15 DIAGNOSIS — N183 Chronic kidney disease, stage 3 unspecified: Secondary | ICD-10-CM | POA: Diagnosis not present

## 2023-05-15 DIAGNOSIS — I10 Essential (primary) hypertension: Secondary | ICD-10-CM | POA: Diagnosis not present

## 2023-05-15 DIAGNOSIS — I739 Peripheral vascular disease, unspecified: Secondary | ICD-10-CM

## 2023-05-15 DIAGNOSIS — E1122 Type 2 diabetes mellitus with diabetic chronic kidney disease: Secondary | ICD-10-CM | POA: Diagnosis not present

## 2023-05-15 DIAGNOSIS — Z299 Encounter for prophylactic measures, unspecified: Secondary | ICD-10-CM | POA: Diagnosis not present

## 2023-05-15 DIAGNOSIS — F17210 Nicotine dependence, cigarettes, uncomplicated: Secondary | ICD-10-CM | POA: Diagnosis not present

## 2023-05-15 DIAGNOSIS — E1165 Type 2 diabetes mellitus with hyperglycemia: Secondary | ICD-10-CM | POA: Diagnosis not present

## 2023-05-15 NOTE — Progress Notes (Signed)
  POST OPERATIVE OFFICE NOTE    CC:  F/u for surgery  HPI:  This is a 63 y.o. male who is s/p left femoral to TP trunk bypass with PTFE by Dr. Myra Gianotti on 04/19/2023 due to left great toe gangrene.  He subsequently underwent first toe ray amputation by podiatry on 04/21/2023.  The patient believes his surgical incisions are healing well.  He is ambulatory with a Darco shoe.  He denies rest pain in his left foot.  He is on aspirin, Plavix, statin daily.  He continues to smoke but is trying to quit.  Allergies  Allergen Reactions   Dimetapp Cold-Allergy [Brompheniramine-Phenylephrine]     hives   Brompheniramine Hives   Chlorpheniramine Hives   Dextromethorphan Hives   Ibuprofen Nausea And Vomiting   Ivp Dye [Iodinated Contrast Media] Itching    Current Outpatient Medications  Medication Sig Dispense Refill   aspirin EC 81 MG tablet Take 1 tablet (81 mg total) by mouth daily at 6 (six) AM. Swallow whole. 30 tablet 1   clopidogrel (PLAVIX) 75 MG tablet Take 1 tablet (75 mg total) by mouth daily at 6 (six) AM. 30 tablet 0   cyanocobalamin 1000 MCG tablet Take 1 tablet (1,000 mcg total) by mouth daily. 30 tablet 0   DULoxetine (CYMBALTA) 60 MG capsule Take 60 mg by mouth daily.     gabapentin (NEURONTIN) 100 MG capsule Take 100 mg by mouth 2 (two) times daily.     HYDROcodone-acetaminophen (NORCO/VICODIN) 5-325 MG tablet Take 1 tablet by mouth 2 (two) times daily.     lisinopril (ZESTRIL) 10 MG tablet Take 2 tablets (20 mg total) by mouth daily. 60 tablet 0   pantoprazole (PROTONIX) 40 MG tablet Take 1 tablet (40 mg total) by mouth daily for 14 days. 14 tablet 0   rosuvastatin (CRESTOR) 20 MG tablet Take 1 tablet (20 mg total) by mouth at bedtime. 30 tablet 0   No current facility-administered medications for this visit.     ROS:  See HPI  Physical Exam:  Vitals:   05/15/23 1049  BP: 132/86  Pulse: (!) 107  Temp: 98.6 F (37 C)  SpO2: 97%  Weight: 226 lb 3.2 oz (102.6 kg)   Height: 5\' 9"  (1.753 m)    Incision: Left groin and below the knee incision healed Extremities: Left great toe reputation site healing well with some nonviable skin edges medially, no drainage; palpable PT pulse Neuro: A&O  Assessment/Plan:  This is a 63 y.o. male who is s/p: Left femoral to TP trunk bypass with PTFE and subsequent first toe ray amputation  Left foot is well-perfused with a palpable PT pulse.  He has completely healed his groin and below the knee incisions.  Overall, toe amputation site is healing well.  He does have some skin edges that are nonviable medially however he does not have any drainage or sign of infection currently.  He will follow-up with his podiatrist for wound check on Thursday of this week.  He should continue walking with a Darco shoe.  He will also continue his aspirin, Plavix, statin daily.  He will return in about a month for left lower extremity bypass duplex and ABI.  He will call/return office sooner with any questions or concerns.   Emilie Rutter, PA-C [facility] [phone]  Clinic MD:  Chestine Spore on call

## 2023-05-16 DIAGNOSIS — I2510 Atherosclerotic heart disease of native coronary artery without angina pectoris: Secondary | ICD-10-CM | POA: Diagnosis not present

## 2023-05-16 DIAGNOSIS — F17210 Nicotine dependence, cigarettes, uncomplicated: Secondary | ICD-10-CM | POA: Diagnosis not present

## 2023-05-16 DIAGNOSIS — I4892 Unspecified atrial flutter: Secondary | ICD-10-CM | POA: Diagnosis not present

## 2023-05-16 DIAGNOSIS — I10 Essential (primary) hypertension: Secondary | ICD-10-CM | POA: Diagnosis not present

## 2023-05-16 DIAGNOSIS — Z9181 History of falling: Secondary | ICD-10-CM | POA: Diagnosis not present

## 2023-05-16 DIAGNOSIS — J449 Chronic obstructive pulmonary disease, unspecified: Secondary | ICD-10-CM | POA: Diagnosis not present

## 2023-05-16 DIAGNOSIS — K219 Gastro-esophageal reflux disease without esophagitis: Secondary | ICD-10-CM | POA: Diagnosis not present

## 2023-05-16 DIAGNOSIS — S51801A Unspecified open wound of right forearm, initial encounter: Secondary | ICD-10-CM | POA: Diagnosis not present

## 2023-05-16 DIAGNOSIS — E1151 Type 2 diabetes mellitus with diabetic peripheral angiopathy without gangrene: Secondary | ICD-10-CM | POA: Diagnosis not present

## 2023-05-16 DIAGNOSIS — Z7982 Long term (current) use of aspirin: Secondary | ICD-10-CM | POA: Diagnosis not present

## 2023-05-16 DIAGNOSIS — Z79891 Long term (current) use of opiate analgesic: Secondary | ICD-10-CM | POA: Diagnosis not present

## 2023-05-16 DIAGNOSIS — D62 Acute posthemorrhagic anemia: Secondary | ICD-10-CM | POA: Diagnosis not present

## 2023-05-16 DIAGNOSIS — E559 Vitamin D deficiency, unspecified: Secondary | ICD-10-CM | POA: Diagnosis not present

## 2023-05-16 DIAGNOSIS — Z48812 Encounter for surgical aftercare following surgery on the circulatory system: Secondary | ICD-10-CM | POA: Diagnosis not present

## 2023-05-16 DIAGNOSIS — Z89412 Acquired absence of left great toe: Secondary | ICD-10-CM | POA: Diagnosis not present

## 2023-05-16 DIAGNOSIS — Z4781 Encounter for orthopedic aftercare following surgical amputation: Secondary | ICD-10-CM | POA: Diagnosis not present

## 2023-05-16 DIAGNOSIS — E7849 Other hyperlipidemia: Secondary | ICD-10-CM | POA: Diagnosis not present

## 2023-05-16 DIAGNOSIS — Z7902 Long term (current) use of antithrombotics/antiplatelets: Secondary | ICD-10-CM | POA: Diagnosis not present

## 2023-05-16 DIAGNOSIS — Z860100 Personal history of colon polyps, unspecified: Secondary | ICD-10-CM | POA: Diagnosis not present

## 2023-05-18 ENCOUNTER — Ambulatory Visit: Payer: 59 | Admitting: Podiatry

## 2023-05-23 ENCOUNTER — Other Ambulatory Visit: Payer: Self-pay | Admitting: *Deleted

## 2023-05-23 DIAGNOSIS — Z48812 Encounter for surgical aftercare following surgery on the circulatory system: Secondary | ICD-10-CM | POA: Diagnosis not present

## 2023-05-23 DIAGNOSIS — Z860100 Personal history of colon polyps, unspecified: Secondary | ICD-10-CM | POA: Diagnosis not present

## 2023-05-23 DIAGNOSIS — Z4781 Encounter for orthopedic aftercare following surgical amputation: Secondary | ICD-10-CM | POA: Diagnosis not present

## 2023-05-23 DIAGNOSIS — Z7902 Long term (current) use of antithrombotics/antiplatelets: Secondary | ICD-10-CM | POA: Diagnosis not present

## 2023-05-23 DIAGNOSIS — E559 Vitamin D deficiency, unspecified: Secondary | ICD-10-CM | POA: Diagnosis not present

## 2023-05-23 DIAGNOSIS — E7849 Other hyperlipidemia: Secondary | ICD-10-CM | POA: Diagnosis not present

## 2023-05-23 DIAGNOSIS — L97509 Non-pressure chronic ulcer of other part of unspecified foot with unspecified severity: Secondary | ICD-10-CM

## 2023-05-23 DIAGNOSIS — Z79891 Long term (current) use of opiate analgesic: Secondary | ICD-10-CM | POA: Diagnosis not present

## 2023-05-23 DIAGNOSIS — Z7982 Long term (current) use of aspirin: Secondary | ICD-10-CM | POA: Diagnosis not present

## 2023-05-23 DIAGNOSIS — E1151 Type 2 diabetes mellitus with diabetic peripheral angiopathy without gangrene: Secondary | ICD-10-CM | POA: Diagnosis not present

## 2023-05-23 DIAGNOSIS — J449 Chronic obstructive pulmonary disease, unspecified: Secondary | ICD-10-CM | POA: Diagnosis not present

## 2023-05-23 DIAGNOSIS — Z9181 History of falling: Secondary | ICD-10-CM | POA: Diagnosis not present

## 2023-05-23 DIAGNOSIS — K219 Gastro-esophageal reflux disease without esophagitis: Secondary | ICD-10-CM | POA: Diagnosis not present

## 2023-05-23 DIAGNOSIS — D62 Acute posthemorrhagic anemia: Secondary | ICD-10-CM | POA: Diagnosis not present

## 2023-05-23 DIAGNOSIS — I10 Essential (primary) hypertension: Secondary | ICD-10-CM | POA: Diagnosis not present

## 2023-05-23 DIAGNOSIS — I4892 Unspecified atrial flutter: Secondary | ICD-10-CM | POA: Diagnosis not present

## 2023-05-23 DIAGNOSIS — I7025 Atherosclerosis of native arteries of other extremities with ulceration: Secondary | ICD-10-CM

## 2023-05-23 DIAGNOSIS — S51801A Unspecified open wound of right forearm, initial encounter: Secondary | ICD-10-CM | POA: Diagnosis not present

## 2023-05-23 DIAGNOSIS — Z89412 Acquired absence of left great toe: Secondary | ICD-10-CM | POA: Diagnosis not present

## 2023-05-23 DIAGNOSIS — I739 Peripheral vascular disease, unspecified: Secondary | ICD-10-CM

## 2023-05-23 DIAGNOSIS — F17210 Nicotine dependence, cigarettes, uncomplicated: Secondary | ICD-10-CM | POA: Diagnosis not present

## 2023-05-23 DIAGNOSIS — I2510 Atherosclerotic heart disease of native coronary artery without angina pectoris: Secondary | ICD-10-CM | POA: Diagnosis not present

## 2023-05-25 ENCOUNTER — Ambulatory Visit (INDEPENDENT_AMBULATORY_CARE_PROVIDER_SITE_OTHER): Payer: 59 | Admitting: Podiatry

## 2023-05-25 DIAGNOSIS — Z89412 Acquired absence of left great toe: Secondary | ICD-10-CM

## 2023-05-25 DIAGNOSIS — E1142 Type 2 diabetes mellitus with diabetic polyneuropathy: Secondary | ICD-10-CM

## 2023-05-25 DIAGNOSIS — Z9889 Other specified postprocedural states: Secondary | ICD-10-CM

## 2023-05-25 NOTE — Progress Notes (Signed)
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 male followed by podiatry status post left partial first ray resection 1/24 with partial wound closure.   Patient reports he is doing very well he has been getting dressing changed and not having issues with it at this time.  Also asking to have his nails trimmed at this visit       Latest Ref Rng & Units 05/02/2023    3:20 PM 04/26/2023    3:45 AM 04/25/2023    3:13 AM  CBC  WBC 4.0 - 10.5 K/uL 16.4  11.7  10.8   Hemoglobin 13.0 - 17.0 g/dL 40.9  81.1  91.4   Hematocrit 39.0 - 52.0 % 41.9  40.3  36.7   Platelets 150 - 400 K/uL 499  395  355        Latest Ref Rng & Units 05/02/2023    3:20 PM 04/26/2023    3:45 AM 04/25/2023    3:13 AM  BMP  Glucose 70 - 99 mg/dL 782  956  213   BUN 8 - 23 mg/dL 7  13  11    Creatinine 0.61 - 1.24 mg/dL 0.86  5.78  4.69   Sodium 135 - 145 mmol/L 138  135  138   Potassium 3.5 - 5.1 mmol/L 4.3  4.6  4.6   Chloride 98 - 111 mmol/L 102  99  105   CO2 22 - 32 mmol/L 23  24  25    Calcium 8.9 - 10.3 mg/dL 9.3  9.1  8.9       Physical Exam: General: AOD x 3, no acute distress   Left lower extremity focused physical exam: Foot appears warm and well perfused. Palpable pedal pulse. Amputation site continuing to healing well with eschar along the incision line however improved from prior with no dehiscence erythema or drainage.  No ascending erythema or streaking lymphangitis.  No cyanosis or pallor to the remaining digits.  Cap refill intact to the digits Decreased edema in the left leg Digital dexterity intact.  Epicritic sensation grossly intact.    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  1 month s/p left partial first ray amputation -Progressing well in regards to the partial first ray resection on the left foot.  Amputation site continues to heal with some eschar however that will fall off with time continues to be healing and underneath nicely with good healthy tissue. -Sutures removed in total from the amputation site at this visit -XR: Deferred at this visit -WB Status: Weightbearing as tolerated in postop shoe -Medications/ABX: No further antibiotics indicated for left foot -Patient is okay to wash the left foot but should dry it off thoroughly after and apply a new bandage over the area I applied Steri-Strips to the incision line today -Debrided the patient's nails x 9 total at this visit - Return in 4 weeks        Corinna Gab, DPM [facility]  05/25/2023   

## 2023-05-26 DIAGNOSIS — E559 Vitamin D deficiency, unspecified: Secondary | ICD-10-CM | POA: Diagnosis not present

## 2023-05-26 DIAGNOSIS — Z4781 Encounter for orthopedic aftercare following surgical amputation: Secondary | ICD-10-CM | POA: Diagnosis not present

## 2023-05-26 DIAGNOSIS — J449 Chronic obstructive pulmonary disease, unspecified: Secondary | ICD-10-CM | POA: Diagnosis not present

## 2023-05-26 DIAGNOSIS — K219 Gastro-esophageal reflux disease without esophagitis: Secondary | ICD-10-CM | POA: Diagnosis not present

## 2023-05-26 DIAGNOSIS — M1611 Unilateral primary osteoarthritis, right hip: Secondary | ICD-10-CM | POA: Diagnosis not present

## 2023-05-26 DIAGNOSIS — Z79891 Long term (current) use of opiate analgesic: Secondary | ICD-10-CM | POA: Diagnosis not present

## 2023-05-26 DIAGNOSIS — M160 Bilateral primary osteoarthritis of hip: Secondary | ICD-10-CM | POA: Diagnosis not present

## 2023-05-26 DIAGNOSIS — S51801A Unspecified open wound of right forearm, initial encounter: Secondary | ICD-10-CM | POA: Diagnosis not present

## 2023-05-26 DIAGNOSIS — M25559 Pain in unspecified hip: Secondary | ICD-10-CM | POA: Diagnosis not present

## 2023-05-26 DIAGNOSIS — M25551 Pain in right hip: Secondary | ICD-10-CM | POA: Diagnosis not present

## 2023-05-26 DIAGNOSIS — D62 Acute posthemorrhagic anemia: Secondary | ICD-10-CM | POA: Diagnosis not present

## 2023-05-26 DIAGNOSIS — Z7982 Long term (current) use of aspirin: Secondary | ICD-10-CM | POA: Diagnosis not present

## 2023-05-26 DIAGNOSIS — F17210 Nicotine dependence, cigarettes, uncomplicated: Secondary | ICD-10-CM | POA: Diagnosis not present

## 2023-05-26 DIAGNOSIS — M7061 Trochanteric bursitis, right hip: Secondary | ICD-10-CM | POA: Diagnosis not present

## 2023-05-26 DIAGNOSIS — Z48812 Encounter for surgical aftercare following surgery on the circulatory system: Secondary | ICD-10-CM | POA: Diagnosis not present

## 2023-05-26 DIAGNOSIS — I4892 Unspecified atrial flutter: Secondary | ICD-10-CM | POA: Diagnosis not present

## 2023-05-26 DIAGNOSIS — R52 Pain, unspecified: Secondary | ICD-10-CM | POA: Diagnosis not present

## 2023-05-26 DIAGNOSIS — Z89412 Acquired absence of left great toe: Secondary | ICD-10-CM | POA: Diagnosis not present

## 2023-05-26 DIAGNOSIS — E1151 Type 2 diabetes mellitus with diabetic peripheral angiopathy without gangrene: Secondary | ICD-10-CM | POA: Diagnosis not present

## 2023-05-26 DIAGNOSIS — Z299 Encounter for prophylactic measures, unspecified: Secondary | ICD-10-CM | POA: Diagnosis not present

## 2023-05-26 DIAGNOSIS — I2510 Atherosclerotic heart disease of native coronary artery without angina pectoris: Secondary | ICD-10-CM | POA: Diagnosis not present

## 2023-05-26 DIAGNOSIS — Z860100 Personal history of colon polyps, unspecified: Secondary | ICD-10-CM | POA: Diagnosis not present

## 2023-05-26 DIAGNOSIS — Z7902 Long term (current) use of antithrombotics/antiplatelets: Secondary | ICD-10-CM | POA: Diagnosis not present

## 2023-05-26 DIAGNOSIS — Z9181 History of falling: Secondary | ICD-10-CM | POA: Diagnosis not present

## 2023-05-26 DIAGNOSIS — E1169 Type 2 diabetes mellitus with other specified complication: Secondary | ICD-10-CM | POA: Diagnosis not present

## 2023-05-26 DIAGNOSIS — E7849 Other hyperlipidemia: Secondary | ICD-10-CM | POA: Diagnosis not present

## 2023-05-26 DIAGNOSIS — I10 Essential (primary) hypertension: Secondary | ICD-10-CM | POA: Diagnosis not present

## 2023-05-30 DIAGNOSIS — Z48812 Encounter for surgical aftercare following surgery on the circulatory system: Secondary | ICD-10-CM | POA: Diagnosis not present

## 2023-05-30 DIAGNOSIS — Z7982 Long term (current) use of aspirin: Secondary | ICD-10-CM | POA: Diagnosis not present

## 2023-05-30 DIAGNOSIS — I4892 Unspecified atrial flutter: Secondary | ICD-10-CM | POA: Diagnosis not present

## 2023-05-30 DIAGNOSIS — E7849 Other hyperlipidemia: Secondary | ICD-10-CM | POA: Diagnosis not present

## 2023-05-30 DIAGNOSIS — D62 Acute posthemorrhagic anemia: Secondary | ICD-10-CM | POA: Diagnosis not present

## 2023-05-30 DIAGNOSIS — K219 Gastro-esophageal reflux disease without esophagitis: Secondary | ICD-10-CM | POA: Diagnosis not present

## 2023-05-30 DIAGNOSIS — J449 Chronic obstructive pulmonary disease, unspecified: Secondary | ICD-10-CM | POA: Diagnosis not present

## 2023-05-30 DIAGNOSIS — I2510 Atherosclerotic heart disease of native coronary artery without angina pectoris: Secondary | ICD-10-CM | POA: Diagnosis not present

## 2023-05-30 DIAGNOSIS — Z7902 Long term (current) use of antithrombotics/antiplatelets: Secondary | ICD-10-CM | POA: Diagnosis not present

## 2023-05-30 DIAGNOSIS — I10 Essential (primary) hypertension: Secondary | ICD-10-CM | POA: Diagnosis not present

## 2023-05-30 DIAGNOSIS — Z79891 Long term (current) use of opiate analgesic: Secondary | ICD-10-CM | POA: Diagnosis not present

## 2023-05-30 DIAGNOSIS — S51801A Unspecified open wound of right forearm, initial encounter: Secondary | ICD-10-CM | POA: Diagnosis not present

## 2023-05-30 DIAGNOSIS — F17210 Nicotine dependence, cigarettes, uncomplicated: Secondary | ICD-10-CM | POA: Diagnosis not present

## 2023-05-30 DIAGNOSIS — E1151 Type 2 diabetes mellitus with diabetic peripheral angiopathy without gangrene: Secondary | ICD-10-CM | POA: Diagnosis not present

## 2023-05-30 DIAGNOSIS — Z4781 Encounter for orthopedic aftercare following surgical amputation: Secondary | ICD-10-CM | POA: Diagnosis not present

## 2023-05-30 DIAGNOSIS — Z860100 Personal history of colon polyps, unspecified: Secondary | ICD-10-CM | POA: Diagnosis not present

## 2023-05-30 DIAGNOSIS — Z89412 Acquired absence of left great toe: Secondary | ICD-10-CM | POA: Diagnosis not present

## 2023-05-30 DIAGNOSIS — Z9181 History of falling: Secondary | ICD-10-CM | POA: Diagnosis not present

## 2023-05-30 DIAGNOSIS — E559 Vitamin D deficiency, unspecified: Secondary | ICD-10-CM | POA: Diagnosis not present

## 2023-06-02 DIAGNOSIS — M25551 Pain in right hip: Secondary | ICD-10-CM | POA: Diagnosis not present

## 2023-06-08 DIAGNOSIS — M1611 Unilateral primary osteoarthritis, right hip: Secondary | ICD-10-CM | POA: Diagnosis not present

## 2023-06-12 DIAGNOSIS — I10 Essential (primary) hypertension: Secondary | ICD-10-CM | POA: Diagnosis not present

## 2023-06-12 DIAGNOSIS — M549 Dorsalgia, unspecified: Secondary | ICD-10-CM | POA: Diagnosis not present

## 2023-06-12 DIAGNOSIS — Z299 Encounter for prophylactic measures, unspecified: Secondary | ICD-10-CM | POA: Diagnosis not present

## 2023-06-12 DIAGNOSIS — R110 Nausea: Secondary | ICD-10-CM | POA: Diagnosis not present

## 2023-06-14 DIAGNOSIS — S51801A Unspecified open wound of right forearm, initial encounter: Secondary | ICD-10-CM | POA: Diagnosis not present

## 2023-06-14 DIAGNOSIS — E559 Vitamin D deficiency, unspecified: Secondary | ICD-10-CM | POA: Diagnosis not present

## 2023-06-14 DIAGNOSIS — Z4781 Encounter for orthopedic aftercare following surgical amputation: Secondary | ICD-10-CM | POA: Diagnosis not present

## 2023-06-14 DIAGNOSIS — E7849 Other hyperlipidemia: Secondary | ICD-10-CM | POA: Diagnosis not present

## 2023-06-14 DIAGNOSIS — F17210 Nicotine dependence, cigarettes, uncomplicated: Secondary | ICD-10-CM | POA: Diagnosis not present

## 2023-06-14 DIAGNOSIS — Z7902 Long term (current) use of antithrombotics/antiplatelets: Secondary | ICD-10-CM | POA: Diagnosis not present

## 2023-06-14 DIAGNOSIS — I10 Essential (primary) hypertension: Secondary | ICD-10-CM | POA: Diagnosis not present

## 2023-06-14 DIAGNOSIS — I2510 Atherosclerotic heart disease of native coronary artery without angina pectoris: Secondary | ICD-10-CM | POA: Diagnosis not present

## 2023-06-14 DIAGNOSIS — Z48812 Encounter for surgical aftercare following surgery on the circulatory system: Secondary | ICD-10-CM | POA: Diagnosis not present

## 2023-06-14 DIAGNOSIS — Z89412 Acquired absence of left great toe: Secondary | ICD-10-CM | POA: Diagnosis not present

## 2023-06-14 DIAGNOSIS — E1151 Type 2 diabetes mellitus with diabetic peripheral angiopathy without gangrene: Secondary | ICD-10-CM | POA: Diagnosis not present

## 2023-06-14 DIAGNOSIS — Z9181 History of falling: Secondary | ICD-10-CM | POA: Diagnosis not present

## 2023-06-14 DIAGNOSIS — I4892 Unspecified atrial flutter: Secondary | ICD-10-CM | POA: Diagnosis not present

## 2023-06-14 DIAGNOSIS — Z7982 Long term (current) use of aspirin: Secondary | ICD-10-CM | POA: Diagnosis not present

## 2023-06-14 DIAGNOSIS — K219 Gastro-esophageal reflux disease without esophagitis: Secondary | ICD-10-CM | POA: Diagnosis not present

## 2023-06-14 DIAGNOSIS — Z860100 Personal history of colon polyps, unspecified: Secondary | ICD-10-CM | POA: Diagnosis not present

## 2023-06-14 DIAGNOSIS — Z79891 Long term (current) use of opiate analgesic: Secondary | ICD-10-CM | POA: Diagnosis not present

## 2023-06-14 DIAGNOSIS — J449 Chronic obstructive pulmonary disease, unspecified: Secondary | ICD-10-CM | POA: Diagnosis not present

## 2023-06-14 DIAGNOSIS — D62 Acute posthemorrhagic anemia: Secondary | ICD-10-CM | POA: Diagnosis not present

## 2023-06-22 ENCOUNTER — Inpatient Hospital Stay (HOSPITAL_COMMUNITY)
Admission: EM | Admit: 2023-06-22 | Discharge: 2023-06-24 | DRG: 617 | Disposition: A | Discharge status: Home / Self Care | Source: Ambulatory Visit | Attending: Family Medicine | Admitting: Family Medicine

## 2023-06-22 ENCOUNTER — Ambulatory Visit (INDEPENDENT_AMBULATORY_CARE_PROVIDER_SITE_OTHER)

## 2023-06-22 ENCOUNTER — Other Ambulatory Visit: Payer: Self-pay

## 2023-06-22 ENCOUNTER — Encounter: Payer: Self-pay | Admitting: Podiatry

## 2023-06-22 ENCOUNTER — Ambulatory Visit (INDEPENDENT_AMBULATORY_CARE_PROVIDER_SITE_OTHER): Payer: 59 | Admitting: Podiatry

## 2023-06-22 ENCOUNTER — Encounter (HOSPITAL_COMMUNITY): Payer: Self-pay | Admitting: Emergency Medicine

## 2023-06-22 VITALS — Ht 69.0 in | Wt 226.2 lb

## 2023-06-22 DIAGNOSIS — Z79899 Other long term (current) drug therapy: Secondary | ICD-10-CM

## 2023-06-22 DIAGNOSIS — Z886 Allergy status to analgesic agent status: Secondary | ICD-10-CM | POA: Diagnosis not present

## 2023-06-22 DIAGNOSIS — J449 Chronic obstructive pulmonary disease, unspecified: Secondary | ICD-10-CM | POA: Diagnosis not present

## 2023-06-22 DIAGNOSIS — M869 Osteomyelitis, unspecified: Secondary | ICD-10-CM

## 2023-06-22 DIAGNOSIS — E785 Hyperlipidemia, unspecified: Secondary | ICD-10-CM | POA: Diagnosis present

## 2023-06-22 DIAGNOSIS — Z888 Allergy status to other drugs, medicaments and biological substances status: Secondary | ICD-10-CM | POA: Diagnosis not present

## 2023-06-22 DIAGNOSIS — E1169 Type 2 diabetes mellitus with other specified complication: Principal | ICD-10-CM | POA: Diagnosis present

## 2023-06-22 DIAGNOSIS — Z7982 Long term (current) use of aspirin: Secondary | ICD-10-CM

## 2023-06-22 DIAGNOSIS — E11621 Type 2 diabetes mellitus with foot ulcer: Secondary | ICD-10-CM | POA: Diagnosis present

## 2023-06-22 DIAGNOSIS — E119 Type 2 diabetes mellitus without complications: Secondary | ICD-10-CM

## 2023-06-22 DIAGNOSIS — I739 Peripheral vascular disease, unspecified: Secondary | ICD-10-CM | POA: Diagnosis present

## 2023-06-22 DIAGNOSIS — Z91041 Radiographic dye allergy status: Secondary | ICD-10-CM

## 2023-06-22 DIAGNOSIS — Z89412 Acquired absence of left great toe: Secondary | ICD-10-CM

## 2023-06-22 DIAGNOSIS — E1151 Type 2 diabetes mellitus with diabetic peripheral angiopathy without gangrene: Secondary | ICD-10-CM | POA: Diagnosis not present

## 2023-06-22 DIAGNOSIS — K219 Gastro-esophageal reflux disease without esophagitis: Secondary | ICD-10-CM | POA: Diagnosis not present

## 2023-06-22 DIAGNOSIS — I2510 Atherosclerotic heart disease of native coronary artery without angina pectoris: Secondary | ICD-10-CM | POA: Diagnosis not present

## 2023-06-22 DIAGNOSIS — Z7984 Long term (current) use of oral hypoglycemic drugs: Secondary | ICD-10-CM

## 2023-06-22 DIAGNOSIS — Z7902 Long term (current) use of antithrombotics/antiplatelets: Secondary | ICD-10-CM | POA: Diagnosis not present

## 2023-06-22 DIAGNOSIS — L97529 Non-pressure chronic ulcer of other part of left foot with unspecified severity: Secondary | ICD-10-CM | POA: Diagnosis not present

## 2023-06-22 DIAGNOSIS — F32A Depression, unspecified: Secondary | ICD-10-CM | POA: Diagnosis present

## 2023-06-22 DIAGNOSIS — Z95820 Peripheral vascular angioplasty status with implants and grafts: Secondary | ICD-10-CM | POA: Diagnosis not present

## 2023-06-22 DIAGNOSIS — M86171 Other acute osteomyelitis, right ankle and foot: Secondary | ICD-10-CM | POA: Diagnosis not present

## 2023-06-22 DIAGNOSIS — L089 Local infection of the skin and subcutaneous tissue, unspecified: Secondary | ICD-10-CM | POA: Diagnosis not present

## 2023-06-22 DIAGNOSIS — M868X7 Other osteomyelitis, ankle and foot: Secondary | ICD-10-CM | POA: Diagnosis present

## 2023-06-22 DIAGNOSIS — Z9889 Other specified postprocedural states: Secondary | ICD-10-CM

## 2023-06-22 DIAGNOSIS — E11628 Type 2 diabetes mellitus with other skin complications: Secondary | ICD-10-CM | POA: Diagnosis present

## 2023-06-22 DIAGNOSIS — R69 Illness, unspecified: Secondary | ICD-10-CM | POA: Diagnosis not present

## 2023-06-22 DIAGNOSIS — E66811 Obesity, class 1: Secondary | ICD-10-CM | POA: Diagnosis present

## 2023-06-22 DIAGNOSIS — E1140 Type 2 diabetes mellitus with diabetic neuropathy, unspecified: Secondary | ICD-10-CM | POA: Diagnosis present

## 2023-06-22 DIAGNOSIS — Z8249 Family history of ischemic heart disease and other diseases of the circulatory system: Secondary | ICD-10-CM

## 2023-06-22 DIAGNOSIS — Z9861 Coronary angioplasty status: Secondary | ICD-10-CM

## 2023-06-22 DIAGNOSIS — F17210 Nicotine dependence, cigarettes, uncomplicated: Secondary | ICD-10-CM | POA: Diagnosis present

## 2023-06-22 DIAGNOSIS — Z833 Family history of diabetes mellitus: Secondary | ICD-10-CM

## 2023-06-22 DIAGNOSIS — I10 Essential (primary) hypertension: Secondary | ICD-10-CM | POA: Diagnosis present

## 2023-06-22 LAB — COMPREHENSIVE METABOLIC PANEL WITH GFR
ALT: 24 U/L (ref 0–44)
AST: 17 U/L (ref 15–41)
Albumin: 3.6 g/dL (ref 3.5–5.0)
Alkaline Phosphatase: 76 U/L (ref 38–126)
Anion gap: 11 (ref 5–15)
BUN: 14 mg/dL (ref 8–23)
CO2: 22 mmol/L (ref 22–32)
Calcium: 9.1 mg/dL (ref 8.9–10.3)
Chloride: 104 mmol/L (ref 98–111)
Creatinine, Ser: 1.09 mg/dL (ref 0.61–1.24)
GFR, Estimated: >60 mL/min (ref >60)
Glucose, Bld: 104 mg/dL — ABNORMAL HIGH (ref 70–99)
Potassium: 3.8 mmol/L (ref 3.5–5.1)
Sodium: 137 mmol/L (ref 135–145)
Total Bilirubin: 0.5 mg/dL (ref 0.0–1.2)
Total Protein: 6.8 g/dL (ref 6.5–8.1)

## 2023-06-22 LAB — CBC WITH DIFFERENTIAL/PLATELET
Abs Immature Granulocytes: 0.04 10*3/uL (ref 0.00–0.07)
Basophils Absolute: 0 10*3/uL (ref 0.0–0.1)
Basophils Relative: 0 %
Eosinophils Absolute: 0.1 10*3/uL (ref 0.0–0.5)
Eosinophils Relative: 1 %
HCT: 40.9 % (ref 39.0–52.0)
Hemoglobin: 13.2 g/dL (ref 13.0–17.0)
Immature Granulocytes: 0 %
Lymphocytes Relative: 20 %
Lymphs Abs: 2.4 10*3/uL (ref 0.7–4.0)
MCH: 28.1 pg (ref 26.0–34.0)
MCHC: 32.3 g/dL (ref 30.0–36.0)
MCV: 87.2 fL (ref 80.0–100.0)
Monocytes Absolute: 0.8 10*3/uL (ref 0.1–1.0)
Monocytes Relative: 7 %
Neutro Abs: 8.7 10*3/uL — ABNORMAL HIGH (ref 1.7–7.7)
Neutrophils Relative %: 72 %
Platelets: 302 10*3/uL (ref 150–400)
RBC: 4.69 MIL/uL (ref 4.22–5.81)
RDW: 14.2 % (ref 11.5–15.5)
WBC: 12 10*3/uL — ABNORMAL HIGH (ref 4.0–10.5)
nRBC: 0 % (ref 0.0–0.2)

## 2023-06-22 LAB — GLUCOSE, CAPILLARY: Glucose-Capillary: 190 mg/dL — ABNORMAL HIGH (ref 70–99)

## 2023-06-22 LAB — LACTIC ACID, PLASMA: Lactic Acid, Venous: 1.1 mmol/L (ref 0.5–1.9)

## 2023-06-22 MED ORDER — ONDANSETRON HCL 4 MG PO TABS: 4.0000 mg | ORAL_TABLET | Freq: Four times a day (QID) | ORAL | Status: DC | PRN

## 2023-06-22 MED ORDER — ONDANSETRON HCL 4 MG/2ML IJ SOLN: 4.0000 mg | Freq: Four times a day (QID) | INTRAMUSCULAR | Status: DC | PRN

## 2023-06-22 MED ORDER — ROSUVASTATIN CALCIUM 20 MG PO TABS
20.0000 mg | ORAL_TABLET | Freq: Every day | ORAL | Status: DC
  Administered 2023-06-22 – 2023-06-23 (×2): 20 mg via ORAL
  Filled 2023-06-22 (×2): qty 1

## 2023-06-22 MED ORDER — NICOTINE 14 MG/24HR TD PT24
14.0000 mg | MEDICATED_PATCH | Freq: Every day | TRANSDERMAL | Status: DC
  Administered 2023-06-22 – 2023-06-24 (×3): 14 mg via TRANSDERMAL
  Filled 2023-06-22 (×3): qty 1

## 2023-06-22 MED ORDER — OXYCODONE-ACETAMINOPHEN 5-325 MG PO TABS
1.0000 | ORAL_TABLET | Freq: Three times a day (TID) | ORAL | Status: AC | PRN
  Administered 2023-06-22 – 2023-06-23 (×2): 1 via ORAL
  Filled 2023-06-22 (×2): qty 1

## 2023-06-22 MED ORDER — SODIUM CHLORIDE 0.9 % IV SOLN
3.0000 g | Freq: Four times a day (QID) | INTRAVENOUS | Status: DC
  Administered 2023-06-22 – 2023-06-24 (×7): 3 g via INTRAVENOUS
  Filled 2023-06-22 (×7): qty 8

## 2023-06-22 MED ORDER — ACETAMINOPHEN 650 MG RE SUPP: 650.0000 mg | Freq: Four times a day (QID) | RECTAL | Status: DC | PRN

## 2023-06-22 MED ORDER — DULOXETINE HCL 60 MG PO CPEP: 60.0000 mg | ORAL_CAPSULE | Freq: Every day | ORAL | Status: DC

## 2023-06-22 MED ORDER — INSULIN ASPART 100 UNIT/ML IJ SOLN: 0.0000 [IU] | Freq: Three times a day (TID) | INTRAMUSCULAR | Status: DC

## 2023-06-22 MED ORDER — ACETAMINOPHEN 325 MG PO TABS: 650.0000 mg | ORAL_TABLET | Freq: Four times a day (QID) | ORAL | Status: DC | PRN

## 2023-06-22 MED ORDER — GABAPENTIN 100 MG PO CAPS
100.0000 mg | ORAL_CAPSULE | Freq: Two times a day (BID) | ORAL | Status: DC
  Filled 2023-06-22 (×2): qty 1

## 2023-06-22 MED ORDER — SODIUM CHLORIDE 0.9 % IV SOLN
3.0000 g | Freq: Once | INTRAVENOUS | Status: AC
  Administered 2023-06-22: 3 g via INTRAVENOUS
  Filled 2023-06-22: qty 8

## 2023-06-22 MED ORDER — VITAMIN B-12 1000 MCG PO TABS: 1000.0000 ug | ORAL_TABLET | Freq: Every day | ORAL | Status: DC

## 2023-06-22 MED ORDER — VANCOMYCIN HCL 2000 MG/400ML IV SOLN
2000.0000 mg | Freq: Once | INTRAVENOUS | Status: DC
  Filled 2023-06-22: qty 400

## 2023-06-22 NOTE — ED Notes (Signed)
 Pt very difficult and unwilling to follow commands.  Pt stated this RN placed IV in "muscle" and told me to take it out "now!".  This RN took IV out.  Antibiotics stopped at this time.  Patient becoming very aggressive towards this nurse as she looked for a different IV site.  This RN felt uncomfortable and left room.  Stretcher in lowest position and call bell within reach.  PA Small notified and aware.
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
## 2023-06-22 NOTE — ED Provider Notes (Signed)
 [place] EMERGENCY DEPARTMENT AT [facility] Provider Note   CSN: [id number] Arrival date & time: 06/22/23  1516     History  No chief complaint on file.   Michael Chambers is a 63 y.o. male, history of diabetes, and drug use, presents to the ED secondary to referral, by his podiatrist.  He went and saw Dr. Annamary Rummage today, his podiatrist, and that he noted that he had a new ulceration on his left second toe.  Dr. Annamary Rummage, was concerned of possible osteomyelitis to send him to the ER, with plans with amputation tomorrow.  Recommended broad-spectrum antibiotics, admission to hospitalist.  Patient states minimal pain, and no fevers or chills.  Home Medications Prior to Admission medications   Medication Sig Start Date End Date Taking? Authorizing Provider  aspirin EC 81 MG tablet Take 1 tablet (81 mg total) by mouth daily at 6 (six) AM. Swallow whole. 04/27/23   Osvaldo Shipper, MD  clopidogrel (PLAVIX) 75 MG tablet Take 1 tablet (75 mg total) by mouth daily at 6 (six) AM. 04/27/23   Osvaldo Shipper, MD  cyanocobalamin 1000 MCG tablet Take 1 tablet (1,000 mcg total) by mouth daily. 04/28/23   Osvaldo Shipper, MD  DULoxetine (CYMBALTA) 60 MG capsule Take 60 mg by mouth daily. 09/24/20   [provider]  empagliflozin (JARDIANCE) 25 MG TABS tablet Take 25 mg by mouth daily. 05/16/23   [provider]  gabapentin (NEURONTIN) 100 MG capsule Take 100 mg by mouth 2 (two) times daily. 09/24/20   [provider]  HYDROcodone-acetaminophen (NORCO/VICODIN) 5-325 MG tablet Take 1 tablet by mouth 2 (two) times daily. 03/22/18   [provider]  lisinopril (ZESTRIL) 10 MG tablet Take 2 tablets (20 mg total) by mouth daily. 04/27/23   Osvaldo Shipper, MD  pantoprazole (PROTONIX) 40 MG tablet Take 1 tablet (40 mg total) by mouth daily for 14 days. 04/28/23 05/12/23  Osvaldo Shipper, MD  rosuvastatin (CRESTOR) 20 MG tablet Take 1 tablet (20 mg total) by mouth at  bedtime. 04/27/23 04/26/24  Osvaldo Shipper, MD      Allergies    Dimetapp cold-allergy [brompheniramine-phenylephrine], Brompheniramine, Chlorpheniramine, Dextromethorphan, Ibuprofen, and Ivp dye [iodinated contrast media]    Review of Systems   Review of Systems  Constitutional:  Negative for fever.  Skin:  Positive for wound.    Physical Exam Updated Vital Signs BP 105/66   Pulse 85   Temp 97.9 F (36.6 C) (Oral)   Resp 15   SpO2 100%  Physical Exam Vitals and nursing note reviewed.  Constitutional:      General: He is not in acute distress.    Appearance: He is well-developed.  HENT:     Head: Normocephalic and atraumatic.  Eyes:     General:        Right eye: No discharge.        Left eye: No discharge.     Conjunctiva/sclera: Conjunctivae normal.  Pulmonary:     Effort: No respiratory distress.  Skin:    Comments: Amputated first phalanx; second phalanx with wound, on the distal aspect, that is malodorous.  Positive dorsalis pedis pulse.  Neurological:     Mental Status: He is alert.     Comments: Clear speech.   Psychiatric:        Behavior: Behavior normal.        Thought Content: Thought content normal.        ED Results / Procedures / Treatments   Labs (  all labs ordered are listed, but only abnormal results are displayed) Labs Reviewed  CBC WITH DIFFERENTIAL/PLATELET - Abnormal; Notable for the following components:      Result Value   WBC 12.0 (*)    Neutro Abs 8.7 (*)    All other components within normal limits  COMPREHENSIVE METABOLIC PANEL WITH GFR - Abnormal; Notable for the following components:   Glucose, Bld 104 (*)    All other components within normal limits  CULTURE, BLOOD (ROUTINE X 2)  CULTURE, BLOOD (ROUTINE X 2)  LACTIC ACID, PLASMA  LACTIC ACID, PLASMA    EKG None  Radiology DG Foot Complete Left Result Date: 06/22/2023 Please see detailed radiograph report in office note.   Procedures Procedures    Medications  Ordered in ED Medications  vancomycin (VANCOREADY) IVPB 2000 mg/400 mL (has no administration in time range)  Ampicillin-Sulbactam (UNASYN) 3 g in sodium chloride 0.9 % 100 mL IVPB (0 g Intravenous Stopped 06/22/23 1705)    ED Course/ Medical Decision Making/ A&P                                 Medical Decision Making Patient sent in for concern for osteomyelitis, with x-ray, done at podiatrist office earlier, consistent with osteomyelitis, Dr. Annamary Rummage, plans to take patient to the OR, in a.m. tomorrow.  Recommends broad-spectrum antibiotics, and admission  Amount and/or Complexity of Data Reviewed Labs: ordered.    Details: Elevated white blood cell count of 12K, normal lactic acid Discussion of management or test interpretation with external provider(s): Seen by Dr. Caleb Popp, he recommends no antibiotics at this time, states the wound is chronic, and thus he does not need any antibiotics, admitted to Dr. Caleb Popp, with plans for amputation tomorrow by Dr. Annamary Rummage.  Risk Prescription drug management. Decision regarding hospitalization.    Final Clinical Impression(s) / ED Diagnoses Final diagnoses:  Osteomyelitis of right foot, unspecified type [facility])    Rx / DC Orders ED Discharge Orders     None         Moni Rothrock, Harley Alto, PA 06/22/23 1759    Linwood Dibbles, MD 06/23/23 1331

## 2023-06-22 NOTE — Progress Notes (Signed)
 Pharmacy Antibiotic Note  Michael Chambers is a 63 y.o. male admitted on 06/22/2023 with  osteomyelitis .  Pharmacy has been consulted for Unasyn dosing.  Patient presented from podiatrist office with concern for new osteomyelitis of his L second toe. Podiatry is planning for 2nd ray amputation 3/28 AM, however antibiotic therapy is to be initiated in the interim. Patient has not been on antibiotics for this infection. He received a dose of vancomycin and Unasyn in the ED.  Plan: Unasyn 3 grams IV every 6 hours Monitor clinical progression and cultures  Follow-up length of therapy following surgical intervention     Temp (24hrs), Avg:97.9 F (36.6 C), Min:97.9 F (36.6 C), Max:97.9 F (36.6 C)  Recent Labs  Lab 06/22/23 1607  WBC 12.0*  CREATININE 1.09  LATICACIDVEN 1.1    Estimated Creatinine Clearance: 81.9 mL/min (by C-G formula based on SCr of 1.09 mg/dL).    Allergies  Allergen Reactions   Dimetapp Cold-Allergy [Brompheniramine-Phenylephrine]     hives   Brompheniramine Hives   Chlorpheniramine Hives   Dextromethorphan Hives   Ibuprofen Nausea And Vomiting   Ivp Dye [Iodinated Contrast Media] Itching    Antimicrobials this admission: Vancomycin 3/27 x 1 Unasyn 3/27 >>   Dose adjustments this admission: N/A  Microbiology results: 3/27 BCx: pending  Thank you for allowing pharmacy to be a part of this patient's care.  Lennie Muckle, PharmD PGY1 Pharmacy Resident 06/22/2023 6:27 PM

## 2023-06-22 NOTE — Progress Notes (Signed)
 ED Pharmacy Antibiotic Sign Off An antibiotic consult was received from an ED provider for vancomycin and Unasyn per pharmacy dosing for wound infection. A chart review was completed to assess appropriateness.   The following one time order(s) were placed:  Unasyn 3 g IV Vancomycin 2000 mg IV  Further antibiotic and/or antibiotic pharmacy consults should be ordered by the admitting provider if indicated.   Thank you for allowing pharmacy to be a part of this patient's care.   Lennie Muckle, PharmD PGY1 Pharmacy Resident 06/22/2023 3:55 PM

## 2023-06-22 NOTE — ED Triage Notes (Signed)
 Pt BIB [facility] from doctor's office for toe amputation by MD Standiford tomorrow. VSS.

## 2023-06-22 NOTE — Progress Notes (Signed)
 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 male followed by podiatry status post left partial first ray resection 1/24 with partial wound closure.    Patient reports he has had a headache since last night.  He was not aware he had a wound on his left second toe.  Reports seeing some blood in his sock earlier today.       Latest Ref Rng & Units 05/02/2023    3:20 PM 04/26/2023    3:45 AM 04/25/2023    3:13 AM  CBC  WBC 4.0 - 10.5 K/uL 16.4  11.7  10.8   Hemoglobin 13.0 - 17.0 g/dL 40.9  81.1  91.4   Hematocrit 39.0 - 52.0 % 41.9  40.3  36.7   Platelets 150 - 400 K/uL 499  395  355        Latest Ref Rng & Units 05/02/2023    3:20 PM 04/26/2023    3:45 AM 04/25/2023    3:13 AM  BMP  Glucose 70 - 99 mg/dL 782  956  213   BUN 8 - 23 mg/dL 7  13  11    Creatinine 0.61 - 1.24 mg/dL 0.86  5.78  4.69   Sodium 135 - 145 mmol/L 138  135  138   Potassium 3.5 - 5.1 mmol/L 4.3  4.6  4.6   Chloride 98 - 111 mmol/L 102  99  105   CO2 22 - 32 mmol/L 23  24  25    Calcium 8.9 - 10.3 mg/dL 9.3  9.1  8.9       Physical Exam: General: AOD x 3, no acute distress   Left lower extremity focused physical exam: Foot appears warm and well perfused. Palpable pedal pulse. Amputation site to the first ray well-healed with healthy skin with some eschar which was debrided.  No evidence of residual infection or partial first ray  New ulceration with significant erythema and edema of the left second toe.  Drainage coming from the distal tuft of the toe.  Probes down to bone.  Malodor present   No ascending erythema or streaking lymphangitis.  No cyanosis or pallor to the remaining digits.  Cap refill intact to the digits Decreased edema in the left leg Digital dexterity intact.  Epicritic sensation grossly intact.  XR 3 views AP lateral bleak of  the left foot.  Findings: Attention directed left second toe distal phalanx there is noted to be erosions of the distal phalanx consistent with osteomyelitis no soft tissue gas  ASSESSMENT/PLAN OF CARE Assessment:  Status post left partial 1st ray resection due to left hallux gangrene 1/24, underlying osteomyelitis.PMHx significant for  hypertension, atrial flutter, carotid artery disease status post left endarterectomy, PAD status post left femoral to tibioperoneal trunk bypass, CAD, CRAO, DM2 w/ neuropathy, cocaine use   Patient now with new onset ulceration left second toe with concern for underlying osteomyelitis and radiographically  Plan:  Patient was evaluated and treated and all questions answered.  1 month s/p left partial first ray amputation -Progressing well in regards to the partial first ray resection on the left foot.  Amputation site fully healed at this time   # New ulceration left second toe with concern for underlying osteomyelitis -XR: Concern for osteomyelitis of distal phalanx -Discussed with patient above finding and clinical picture consistent with  osteomyelitis recommend he proceed immediately to the [facility] emergency department.  He says he will do this.  Will plan for surgery for amputation of the left second toe tomorrow afternoon.  N.p.o. past midnight begin broad-spectrum antibiotics          Corinna Gab, DPM [facility]                   06/22/2023

## 2023-06-23 ENCOUNTER — Inpatient Hospital Stay (HOSPITAL_COMMUNITY): Admitting: Anesthesiology

## 2023-06-23 ENCOUNTER — Encounter (HOSPITAL_COMMUNITY)
Admission: EM | Disposition: A | Discharge status: Home / Self Care | Payer: Self-pay | Source: Ambulatory Visit | Attending: Family Medicine

## 2023-06-23 ENCOUNTER — Inpatient Hospital Stay (HOSPITAL_COMMUNITY)

## 2023-06-23 ENCOUNTER — Other Ambulatory Visit: Payer: Self-pay

## 2023-06-23 ENCOUNTER — Encounter (HOSPITAL_COMMUNITY): Payer: Self-pay | Admitting: Family Medicine

## 2023-06-23 DIAGNOSIS — Z888 Allergy status to other drugs, medicaments and biological substances status: Secondary | ICD-10-CM | POA: Diagnosis not present

## 2023-06-23 DIAGNOSIS — F17210 Nicotine dependence, cigarettes, uncomplicated: Secondary | ICD-10-CM

## 2023-06-23 DIAGNOSIS — E1169 Type 2 diabetes mellitus with other specified complication: Secondary | ICD-10-CM | POA: Diagnosis present

## 2023-06-23 DIAGNOSIS — E11628 Type 2 diabetes mellitus with other skin complications: Secondary | ICD-10-CM | POA: Diagnosis present

## 2023-06-23 DIAGNOSIS — Z7984 Long term (current) use of oral hypoglycemic drugs: Secondary | ICD-10-CM | POA: Diagnosis not present

## 2023-06-23 DIAGNOSIS — M869 Osteomyelitis, unspecified: Secondary | ICD-10-CM

## 2023-06-23 DIAGNOSIS — I2510 Atherosclerotic heart disease of native coronary artery without angina pectoris: Secondary | ICD-10-CM

## 2023-06-23 DIAGNOSIS — E1140 Type 2 diabetes mellitus with diabetic neuropathy, unspecified: Secondary | ICD-10-CM | POA: Diagnosis present

## 2023-06-23 DIAGNOSIS — M868X7 Other osteomyelitis, ankle and foot: Secondary | ICD-10-CM | POA: Diagnosis present

## 2023-06-23 DIAGNOSIS — Z7902 Long term (current) use of antithrombotics/antiplatelets: Secondary | ICD-10-CM | POA: Diagnosis not present

## 2023-06-23 DIAGNOSIS — M7732 Calcaneal spur, left foot: Secondary | ICD-10-CM | POA: Diagnosis not present

## 2023-06-23 DIAGNOSIS — M898X7 Other specified disorders of bone, ankle and foot: Secondary | ICD-10-CM | POA: Diagnosis not present

## 2023-06-23 DIAGNOSIS — E785 Hyperlipidemia, unspecified: Secondary | ICD-10-CM | POA: Diagnosis present

## 2023-06-23 DIAGNOSIS — E1151 Type 2 diabetes mellitus with diabetic peripheral angiopathy without gangrene: Secondary | ICD-10-CM | POA: Diagnosis present

## 2023-06-23 DIAGNOSIS — Z9889 Other specified postprocedural states: Secondary | ICD-10-CM | POA: Diagnosis not present

## 2023-06-23 DIAGNOSIS — M86672 Other chronic osteomyelitis, left ankle and foot: Secondary | ICD-10-CM | POA: Diagnosis not present

## 2023-06-23 DIAGNOSIS — Z95820 Peripheral vascular angioplasty status with implants and grafts: Secondary | ICD-10-CM | POA: Diagnosis not present

## 2023-06-23 DIAGNOSIS — I10 Essential (primary) hypertension: Secondary | ICD-10-CM | POA: Diagnosis present

## 2023-06-23 DIAGNOSIS — E11621 Type 2 diabetes mellitus with foot ulcer: Secondary | ICD-10-CM | POA: Diagnosis present

## 2023-06-23 DIAGNOSIS — L97529 Non-pressure chronic ulcer of other part of left foot with unspecified severity: Secondary | ICD-10-CM | POA: Diagnosis present

## 2023-06-23 DIAGNOSIS — Z7982 Long term (current) use of aspirin: Secondary | ICD-10-CM | POA: Diagnosis not present

## 2023-06-23 DIAGNOSIS — Z79899 Other long term (current) drug therapy: Secondary | ICD-10-CM | POA: Diagnosis not present

## 2023-06-23 DIAGNOSIS — Z89422 Acquired absence of other left toe(s): Secondary | ICD-10-CM | POA: Diagnosis not present

## 2023-06-23 DIAGNOSIS — K219 Gastro-esophageal reflux disease without esophagitis: Secondary | ICD-10-CM | POA: Diagnosis present

## 2023-06-23 DIAGNOSIS — E66811 Obesity, class 1: Secondary | ICD-10-CM | POA: Diagnosis present

## 2023-06-23 DIAGNOSIS — Z91041 Radiographic dye allergy status: Secondary | ICD-10-CM | POA: Diagnosis not present

## 2023-06-23 DIAGNOSIS — Z886 Allergy status to analgesic agent status: Secondary | ICD-10-CM | POA: Diagnosis not present

## 2023-06-23 DIAGNOSIS — J449 Chronic obstructive pulmonary disease, unspecified: Secondary | ICD-10-CM | POA: Diagnosis present

## 2023-06-23 DIAGNOSIS — F32A Depression, unspecified: Secondary | ICD-10-CM | POA: Diagnosis present

## 2023-06-23 DIAGNOSIS — L089 Local infection of the skin and subcutaneous tissue, unspecified: Secondary | ICD-10-CM | POA: Diagnosis present

## 2023-06-23 DIAGNOSIS — M86172 Other acute osteomyelitis, left ankle and foot: Secondary | ICD-10-CM | POA: Diagnosis not present

## 2023-06-23 HISTORY — PX: AMPUTATION TOE: SHX6595

## 2023-06-23 LAB — GLUCOSE, CAPILLARY
Glucose-Capillary: 130 mg/dL — ABNORMAL HIGH (ref 70–99)
Glucose-Capillary: 117 mg/dL — ABNORMAL HIGH (ref 70–99)
Glucose-Capillary: 123 mg/dL — ABNORMAL HIGH (ref 70–99)
Glucose-Capillary: 88 mg/dL (ref 70–99)
Glucose-Capillary: 79 mg/dL (ref 70–99)
Glucose-Capillary: 143 mg/dL — ABNORMAL HIGH (ref 70–99)

## 2023-06-23 LAB — SURGICAL PCR SCREEN
MRSA, PCR: NEGATIVE
Staphylococcus aureus: NEGATIVE

## 2023-06-23 SURGERY — AMPUTATION, TOE
Anesthesia: Monitor Anesthesia Care | Site: Toe | Laterality: Left

## 2023-06-23 MED ORDER — LIDOCAINE HCL (PF) 1 % IJ SOLN
INTRAMUSCULAR | Status: DC | PRN
  Administered 2023-06-23: 5 mL

## 2023-06-23 MED ORDER — PROPOFOL 500 MG/50ML IV EMUL
INTRAVENOUS | Status: DC | PRN
  Administered 2023-06-23: 150 ug/kg/min via INTRAVENOUS
  Administered 2023-06-23: 30 mg via INTRAVENOUS

## 2023-06-23 MED ORDER — CLOPIDOGREL BISULFATE 75 MG PO TABS
75.0000 mg | ORAL_TABLET | Freq: Every day | ORAL | Status: DC
  Administered 2023-06-23 – 2023-06-24 (×2): 75 mg via ORAL
  Filled 2023-06-23 (×2): qty 1

## 2023-06-23 MED ORDER — LIDOCAINE HCL (PF) 1 % IJ SOLN
INTRAMUSCULAR | Status: AC
Start: 2023-06-23 — End: ?
  Filled 2023-06-23: qty 10

## 2023-06-23 MED ORDER — FENTANYL CITRATE (PF) 100 MCG/2ML IJ SOLN
INTRAMUSCULAR | Status: AC
  Filled 2023-06-23: qty 2

## 2023-06-23 MED ORDER — OXYCODONE HCL 5 MG PO TABS
ORAL_TABLET | ORAL | Status: AC
  Filled 2023-06-23: qty 1

## 2023-06-23 MED ORDER — FENTANYL CITRATE (PF) 100 MCG/2ML IJ SOLN
25.0000 ug | INTRAMUSCULAR | Status: DC | PRN
  Administered 2023-06-23 (×2): 25 ug via INTRAVENOUS

## 2023-06-23 MED ORDER — MIDAZOLAM HCL 2 MG/2ML IJ SOLN
INTRAMUSCULAR | Status: DC | PRN
Start: 2023-06-23 — End: 2023-06-23
  Administered 2023-06-23 (×2): 1 mg via INTRAVENOUS

## 2023-06-23 MED ORDER — OXYCODONE-ACETAMINOPHEN 5-325 MG PO TABS
1.0000 | ORAL_TABLET | Freq: Three times a day (TID) | ORAL | Status: AC | PRN
  Administered 2023-06-23 – 2023-06-24 (×2): 1 via ORAL
  Filled 2023-06-23 (×2): qty 1

## 2023-06-23 MED ORDER — OXYCODONE HCL 5 MG/5ML PO SOLN: 5.0000 mg | Freq: Once | ORAL | Status: AC | PRN

## 2023-06-23 MED ORDER — PHENYLEPHRINE 80 MCG/ML (10ML) SYRINGE FOR IV PUSH (FOR BLOOD PRESSURE SUPPORT)
PREFILLED_SYRINGE | INTRAVENOUS | Status: DC | PRN
Start: 2023-06-23 — End: 2023-06-23
  Administered 2023-06-23: 240 ug via INTRAVENOUS
  Administered 2023-06-23: 160 ug via INTRAVENOUS
  Administered 2023-06-23: 240 ug via INTRAVENOUS
  Administered 2023-06-23 (×2): 160 ug via INTRAVENOUS

## 2023-06-23 MED ORDER — LACTATED RINGERS IV SOLN: INTRAVENOUS | Status: DC

## 2023-06-23 MED ORDER — ALBUMIN HUMAN 5 % IV SOLN
INTRAVENOUS | Status: AC
  Filled 2023-06-23: qty 250

## 2023-06-23 MED ORDER — ALBUMIN HUMAN 5 % IV SOLN
12.5000 g | Freq: Once | INTRAVENOUS | Status: AC
  Administered 2023-06-23: 12.5 g via INTRAVENOUS

## 2023-06-23 MED ORDER — CHLORHEXIDINE GLUCONATE 0.12 % MT SOLN: 15.0000 mL | Freq: Once | OROMUCOSAL | Status: AC

## 2023-06-23 MED ORDER — INSULIN ASPART 100 UNIT/ML IJ SOLN: 0.0000 [IU] | INTRAMUSCULAR | Status: DC | PRN

## 2023-06-23 MED ORDER — MIDAZOLAM HCL 2 MG/2ML IJ SOLN
INTRAMUSCULAR | Status: AC
  Filled 2023-06-23: qty 2

## 2023-06-23 MED ORDER — PHENYLEPHRINE 80 MCG/ML (10ML) SYRINGE FOR IV PUSH (FOR BLOOD PRESSURE SUPPORT)
PREFILLED_SYRINGE | INTRAVENOUS | Status: AC
  Filled 2023-06-23: qty 10

## 2023-06-23 MED ORDER — FENTANYL CITRATE (PF) 250 MCG/5ML IJ SOLN
INTRAMUSCULAR | Status: AC
  Filled 2023-06-23: qty 5

## 2023-06-23 MED ORDER — SODIUM CHLORIDE 0.9 % IR SOLN
Status: DC | PRN
  Administered 2023-06-23: 1000 mL

## 2023-06-23 MED ORDER — LIDOCAINE 2% (20 MG/ML) 5 ML SYRINGE
INTRAMUSCULAR | Status: AC
  Filled 2023-06-23: qty 5

## 2023-06-23 MED ORDER — OXYCODONE HCL 5 MG PO TABS
5.0000 mg | ORAL_TABLET | Freq: Once | ORAL | Status: AC | PRN
  Administered 2023-06-23: 5 mg via ORAL

## 2023-06-23 MED ORDER — LIDOCAINE 2% (20 MG/ML) 5 ML SYRINGE
INTRAMUSCULAR | Status: DC | PRN
  Administered 2023-06-23: 50 mg via INTRAVENOUS

## 2023-06-23 MED ORDER — BUPIVACAINE HCL (PF) 0.5 % IJ SOLN
INTRAMUSCULAR | Status: DC | PRN
  Administered 2023-06-23: 5 mL

## 2023-06-23 MED ORDER — ORAL CARE MOUTH RINSE
15.0000 mL | Freq: Once | OROMUCOSAL | Status: AC
  Administered 2023-06-23: 15 mL via OROMUCOSAL

## 2023-06-23 MED ORDER — ASPIRIN 81 MG PO TBEC
81.0000 mg | DELAYED_RELEASE_TABLET | Freq: Every day | ORAL | Status: DC
  Administered 2023-06-23 – 2023-06-24 (×2): 81 mg via ORAL
  Filled 2023-06-23 (×2): qty 1

## 2023-06-23 MED ORDER — BUPIVACAINE HCL (PF) 0.5 % IJ SOLN
INTRAMUSCULAR | Status: AC
  Filled 2023-06-23: qty 10

## 2023-06-23 MED ORDER — ONDANSETRON HCL 4 MG/2ML IJ SOLN
INTRAMUSCULAR | Status: DC | PRN
  Administered 2023-06-23: 4 mg via INTRAVENOUS

## 2023-06-23 MED ORDER — ONDANSETRON HCL 4 MG/2ML IJ SOLN
INTRAMUSCULAR | Status: AC
  Filled 2023-06-23: qty 2

## 2023-06-23 MED ORDER — AMISULPRIDE (ANTIEMETIC) 5 MG/2ML IV SOLN: 10.0000 mg | Freq: Once | INTRAVENOUS | Status: DC | PRN

## 2023-06-23 SURGICAL SUPPLY — 39 items
BLADE AVERAGE 25X9 (BLADE) IMPLANT
BLADE SURG 10 STRL SS (BLADE) ×1 IMPLANT
BLADE SURG 15 STRL LF DISP TIS (BLADE) ×1 IMPLANT
BNDG COHESIVE 3X5 TAN ST LF (GAUZE/BANDAGES/DRESSINGS) ×1 IMPLANT
BNDG ELASTIC 3INX 5YD STR LF (GAUZE/BANDAGES/DRESSINGS) ×1 IMPLANT
BNDG ELASTIC 4INX 5YD STR LF (GAUZE/BANDAGES/DRESSINGS) IMPLANT
BNDG ESMARK 4X9 LF (GAUZE/BANDAGES/DRESSINGS) ×1 IMPLANT
BNDG GAUZE DERMACEA FLUFF 4 (GAUZE/BANDAGES/DRESSINGS) IMPLANT
CHLORAPREP W/TINT 26 (MISCELLANEOUS) IMPLANT
DRSG ADAPTIC 3X8 NADH LF (GAUZE/BANDAGES/DRESSINGS) IMPLANT
DRSG XEROFORM 1X8 (GAUZE/BANDAGES/DRESSINGS) IMPLANT
ELECT REM PT RETURN 9FT ADLT (ELECTROSURGICAL) ×1 IMPLANT
ELECTRODE REM PT RTRN 9FT ADLT (ELECTROSURGICAL) ×1 IMPLANT
GAUZE PAD ABD 8X10 STRL (GAUZE/BANDAGES/DRESSINGS) IMPLANT
GAUZE SPONGE 2X2 STRL 8-PLY (GAUZE/BANDAGES/DRESSINGS) IMPLANT
GAUZE SPONGE 4X4 12PLY STRL (GAUZE/BANDAGES/DRESSINGS) ×1 IMPLANT
GAUZE STRETCH 2X75IN STRL (MISCELLANEOUS) ×1 IMPLANT
GAUZE XEROFORM 1X8 LF (GAUZE/BANDAGES/DRESSINGS) ×1 IMPLANT
GLOVE BIO SURGEON STRL SZ7.5 (GLOVE) ×1 IMPLANT
GLOVE BIOGEL PI IND STRL 7.5 (GLOVE) ×1 IMPLANT
GOWN STRL REUS W/ TWL LRG LVL3 (GOWN DISPOSABLE) ×2 IMPLANT
KIT BASIN OR (CUSTOM PROCEDURE TRAY) ×1 IMPLANT
NDL BIOPSY JAMSHIDI 8X6 (NEEDLE) IMPLANT
NDL HYPO 25X1 1.5 SAFETY (NEEDLE) ×1 IMPLANT
NEEDLE BIOPSY JAMSHIDI 8X6 (NEEDLE) IMPLANT
NEEDLE HYPO 25X1 1.5 SAFETY (NEEDLE) ×1 IMPLANT
PACK ORTHO EXTREMITY (CUSTOM PROCEDURE TRAY) ×1 IMPLANT
PADDING CAST ABS COTTON 4X4 ST (CAST SUPPLIES) ×2 IMPLANT
SET HNDPC FAN SPRY TIP SCT (DISPOSABLE) IMPLANT
SPIKE FLUID TRANSFER (MISCELLANEOUS) IMPLANT
STOCKINETTE 4X48 STRL (DRAPES) IMPLANT
SUT ETHILON 3 0 FSLX (SUTURE) IMPLANT
SUT PROLENE 3 0 PS 2 (SUTURE) IMPLANT
SUT PROLENE 4 0 PS 2 18 (SUTURE) IMPLANT
SYR CONTROL 10ML LL (SYRINGE) ×1 IMPLANT
TUBE CONNECTING 12X1/4 (SUCTIONS) IMPLANT
UNDERPAD 30X36 HEAVY ABSORB (UNDERPADS AND DIAPERS) ×1 IMPLANT
WATER STERILE IRR 1000ML POUR (IV SOLUTION) ×1 IMPLANT
YANKAUER SUCT BULB TIP NO VENT (SUCTIONS) IMPLANT

## 2023-06-23 NOTE — Progress Notes (Signed)
 PROGRESS NOTE    [name]  MWU:[id number] DOB: 10-Jul-1960 DOA: 06/22/2023 PCP: Kirstie Peri, MD   Brief Narrative: Michael Chambers is a 63 y.o. male with a history of depression, CAD, PAD, diabetes mellitus type 2, GERD .  Patient presented secondary to the ED at the request of his podiatrist for left second toe osteomyelitis. Antibiotics started. Plan for second ray amputation.   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 Patient presented from podiatrist's office for 2nd ray amputation. Empiric antibiotics started on admission. Patient has a mild chronic leukocytosis with WBC of 12 which is stable.  -Podiatry recommendations: plan for left 2nd ray amputation -Unasyn IV -Follow-up blood cultures   CAD History of PCI without stent placement.   Diabetes mellitus type 2 Not well controlled with last hemoglobin A1C of 7.6%. Patient is on Jardiance as an outpatient. -Hold Jardiance until discharge -Carb modified diet -SSI   Peripheral neuropathy -Continue gabapentin   Depression Patient is no longer taking Cymbalta.   PAD History of left femoral-popliteal bypass graft/stent.  -Continue aspirin and Plavix     Family Communication: None at bedside Disposition Plan: Discharge home pending ongoing podiatry recommendations   Consultants:  Podiatry  Procedures:  None  Antimicrobials: Unasyn Vancomycin    Subjective: No specific concern this morning.  Objective: BP 98/70 (BP Location: Left Arm)   Pulse 69   Temp (!) 97.5 F (36.4 C) (Oral)   Resp 16   SpO2 97%   Examination:  General exam: Appears calm and comfortable Respiratory system: Clear to auscultation. Respiratory effort normal. Cardiovascular system: S1 & S2 heard, RRR. No murmurs, rubs, gallops or clicks. Gastrointestinal system: Abdomen is nondistended, soft and nontender. Normal bowel sounds heard. Central nervous system:  Alert and oriented. No focal neurological deficits. Musculoskeletal: No edema. No calf tenderness Psychiatry: Judgement and insight appear normal. Mood & affect appropriate.    Data Reviewed: I have personally reviewed following labs and imaging studies  CBC Lab Results  Component Value Date   WBC 12.0 (H) 06/22/2023   RBC 4.69 06/22/2023   HGB 13.2 06/22/2023   HCT 40.9 06/22/2023   MCV 87.2 06/22/2023   MCH 28.1 06/22/2023   PLT 302 06/22/2023   MCHC 32.3 06/22/2023   RDW 14.2 06/22/2023   LYMPHSABS 2.4 06/22/2023   MONOABS 0.8 06/22/2023   EOSABS 0.1 06/22/2023   BASOSABS 0.0 06/22/2023     Last metabolic panel Lab Results  Component Value Date   NA 137 06/22/2023   K 3.8 06/22/2023   CL 104 06/22/2023   CO2 22 06/22/2023   BUN 14 06/22/2023   CREATININE 1.09 06/22/2023   GLUCOSE 104 (H) 06/22/2023   GFRNONAA >60 06/22/2023   GFRAA >60 03/16/2016   CALCIUM 9.1 06/22/2023   PHOS 3.4 04/26/2023   PROT 6.8 06/22/2023   ALBUMIN 3.6 06/22/2023   BILITOT 0.5 06/22/2023   ALKPHOS 76 06/22/2023   AST 17 06/22/2023   ALT 24 06/22/2023   ANIONGAP 11 06/22/2023    GFR: Estimated Creatinine Clearance: 81.9 mL/min (by C-G formula based on SCr of 1.09 mg/dL).  Recent Results (from the past 240 hours)  Blood culture (routine x 2)     Status: None (Preliminary result)   Collection Time: 06/22/23  4:07 PM   Specimen: BLOOD LEFT ARM  Result Value Ref Range Status   Specimen Description BLOOD LEFT ARM  Final   Special Requests  Final    BOTTLES DRAWN AEROBIC AND ANAEROBIC Blood Culture adequate volume   Culture   Final    NO GROWTH < 24 HOURS Performed at [facility], [address], [place], [place] [address]    Report Status PENDING  Incomplete  Blood culture (routine x 2)     Status: None (Preliminary result)   Collection Time: 06/22/23  4:50 PM   Specimen: BLOOD LEFT ARM  Result Value Ref Range Status   Specimen Description BLOOD LEFT ARM  Final    Special Requests   Final    BOTTLES DRAWN AEROBIC AND ANAEROBIC Blood Culture results may not be optimal due to an inadequate volume of blood received in culture bottles   Culture   Final    NO GROWTH < 24 HOURS Performed at [facility], [address], [place], [place] [address]    Report Status PENDING  Incomplete  Surgical pcr screen     Status: None   Collection Time: 06/23/23  7:36 AM   Specimen: Nasal Mucosa; Nasal Swab  Result Value Ref Range Status   MRSA, PCR NEGATIVE NEGATIVE Final   Staphylococcus aureus NEGATIVE NEGATIVE Final    Comment: (NOTE) The Xpert SA Assay (FDA approved for NASAL specimens in patients 61 years of age and older), is one component of a comprehensive surveillance program. It is not intended to diagnose infection nor to guide or monitor treatment. Performed at [facility], [address], [place], [place] [address]       Radiology Studies: DG Foot Complete Left Result Date: 06/22/2023 Please see detailed radiograph report in office note.     LOS: 0 days    Jacquelin Hawking, MD [facility] 06/23/2023, 9:35 AM   If 7PM-7AM, please contact night-coverage [email or web address]

## 2023-06-23 NOTE — Progress Notes (Signed)
 Orthopedic Tech Progress Note Patient Details:  Michael Chambers [birth year] [id number]  Ortho Devices Type of Ortho Device: Postop shoe/boot Ortho Device/Splint Location: LLE Ortho Device/Splint Interventions: Ordered, Application   Post Interventions Patient Tolerated: Well  [name] Michael Chambers 06/23/2023, 4:16 PM

## 2023-06-23 NOTE — Op Note (Signed)
 Full Operative Report  Date of Operation: 3:16 PM, 06/23/2023   Patient: Michael Chambers - 63 y.o. male  Surgeon: Pilar Plate, DPM   Assistant: None  Diagnosis: Osteomyelitis left 2nd toe  Procedure:  1. Amputation of 2nd toe at MPJ level, left foot    Anesthesia: Monitor Anesthesia Care  Ellender, Catheryn Bacon, MD  Anesthesiologist: Leonides Grills, MD CRNA: Lovie Chol, CRNA   Estimated Blood Loss: Minimal   Hemostasis: 1) Anatomical dissection, mechanical compression, electrocautery 2) No tourniquet was used  Implants: * No implants in log *  Materials: Prolene 3-0  Injectables: 1) Pre-operatively: 10 cc of 50:50 mixture 1%lidocaine plain and 0.5% marcaine plain 2) Post-operatively: None   Specimens: Pathology: L 2nd toe for path  Microbiology: bone culture distal phalanx 2nd toe   Antibiotics: IV antibiotics given per schedule on the floor  Drains: None  Complications: Patient tolerated the procedure well without complication.   Operative findings: As below in detailed report  Indications for Procedure: DEKARI BURES presents to Pilar Plate, DPM with a chief complaint of ulcer of left 2nd toe with underlying acute OM. The patient has failed conservative treatments of various modalities. At this time the patient has elected to proceed with surgical correction. All alternatives, risks, and complications of the procedures were thoroughly explained to the patient. Patient exhibits appropriate understanding of all discussion points and informed consent was signed and obtained in the chart with no guarantees to surgical outcome given or implied.  Description of Procedure: Patient was brought to the operating room. Patient remained on their hospital bed in the supine position. A surgical timeout was performed and all members of the operating room, the procedure, and the surgical site were identified. anesthesia occurred as per anesthesia record.  Local anesthetic as previously described was then injected about the operative field in a local infiltrative block.  The operative lower extremity as noted above was then prepped and draped in the usual sterile manner. The following procedure then began.  Attention was directed to the 2nd digit on the Left foot. A full-thickness incision encompassing the entire digit was made using a #15 blade. Dissection was carried down to bone. The toe was secured with a towel clamp, further dissected in its entirety, and disarticulated at the MPJ and passed to the back table as a gross specimen. This was then labled and sent to pathology. The bone was noted to be soft and eroded, and consistent with osteomyelitis. All remaining necrotic and devitalized soft tissue structures were visualized and dissected away using sharp and dull dissection. Care was taken to protect all neurovascular structures throughout the dissection. All bleeders were cauterized as necessary. A bone culture was taken from the distal phalanx. The area was then flushed with copious amounts of sterile saline. Then using the suture materials previously described, the site was closed in anatomic layers and the skin was well approximated under minimal tension.   The surgical site was then dressed with xerform, 4x4 kerlix ace wrap. The patient tolerated both the procedure and anesthesia well with vital signs stable throughout. The patient was transferred in good condition and all vital signs stable  from the OR to recovery under the discretion of anesthesia.  Condition: Vital signs stable, neurovascular status unchanged from preoperative   Surgical plan:  Expect clean margin,  rec 7 days augmentin on DC, wbat in post op shoe. Leave dressing c/d/I until follow up in office next week thursday  The  patient will be WBAT in a post op shoe to the operative limb until further instructed. The dressing is to remain clean, dry, and intact. Will continue to  follow unless noted elsewhere.   [name], DPM [facility]

## 2023-06-23 NOTE — Anesthesia Preprocedure Evaluation (Addendum)
 Anesthesia Evaluation  Patient identified by MRN, date of birth, ID band Patient awake    Reviewed: Allergy & Precautions, NPO status , Patient's Chart, lab work & pertinent test results  Airway Mallampati: II  TM Distance: >3 FB Neck ROM: Full    Dental no notable dental hx. (+) Missing, Dental Advisory Given, Edentulous Upper   Pulmonary COPD, Current Smoker and Patient abstained from smoking.   Pulmonary exam normal        Cardiovascular hypertension, Pt. on medications + CAD and + Peripheral Vascular Disease  Normal cardiovascular exam     Neuro/Psych  PSYCHIATRIC DISORDERS  Depression    negative neurological ROS     GI/Hepatic negative GI ROS, Neg liver ROS,,,  Endo/Other  diabetes    Renal/GU negative Renal ROS     Musculoskeletal negative musculoskeletal ROS (+)    Abdominal  (+) + obese  Peds  Hematology  (+) Blood dyscrasia (Plavix)   Anesthesia Other Findings Osteomyelitis left 2nd toe  Reproductive/Obstetrics                              Anesthesia Physical Anesthesia Plan  ASA: 3  Anesthesia Plan: MAC   Post-op Pain Management:    Induction:   PONV Risk Score and Plan: 0 and Ondansetron, Dexamethasone, Propofol infusion, Midazolam and Treatment may vary due to age or medical condition  Airway Management Planned: Simple Face Mask  Additional Equipment:   Intra-op Plan:   Post-operative Plan:   Informed Consent: I have reviewed the patients History and Physical, chart, labs and discussed the procedure including the risks, benefits and alternatives for the proposed anesthesia with the patient or authorized representative who has indicated his/her understanding and acceptance.     Dental advisory given  Plan Discussed with: CRNA and Surgeon  Anesthesia Plan Comments:        Anesthesia Quick Evaluation

## 2023-06-23 NOTE — Transfer of Care (Signed)
 Immediate Anesthesia Transfer of Care Note  Patient: Michael Chambers  Procedure(s) Performed: AMPUTATION, TOE (Left: Toe)  Patient Location: PACU  Anesthesia Type:MAC  Level of Consciousness: awake, oriented, and patient cooperative  Airway & Oxygen Therapy: Patient Spontanous Breathing and Patient connected to face mask oxygen  Post-op Assessment: Report given to RN and Post -op Vital signs reviewed and stable  Post vital signs: Reviewed  Last Vitals:  Vitals Value Taken Time  BP 117/56 06/23/23 1525  Temp    Pulse 66 06/23/23 1527  Resp 21 06/23/23 1527  SpO2 97 % 06/23/23 1527  Vitals shown include unfiled device data.  Last Pain:  Vitals:   06/23/23 1403  TempSrc:   PainSc: 0-No pain         Complications: No notable events documented.

## 2023-06-23 NOTE — Hospital Course (Addendum)
 Michael Chambers is a 63 y.o. male with a history of depression, CAD, PAD, diabetes mellitus type 2, GERD .  Patient presented secondary to the ED at the request of his podiatrist for left second toe osteomyelitis. Antibiotics started. Patient underwent a 2nd ray amputation on 3/28 and was placed on Augmentin for discharge. Patient to follow-up with podiatry for post-op care.
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
## 2023-06-24 ENCOUNTER — Other Ambulatory Visit (HOSPITAL_COMMUNITY): Payer: Self-pay

## 2023-06-24 DIAGNOSIS — M869 Osteomyelitis, unspecified: Secondary | ICD-10-CM | POA: Diagnosis not present

## 2023-06-24 LAB — GLUCOSE, CAPILLARY: Glucose-Capillary: 148 mg/dL — ABNORMAL HIGH (ref 70–99)

## 2023-06-24 MED ORDER — OXYCODONE-ACETAMINOPHEN 5-325 MG PO TABS: 1.0000 | ORAL_TABLET | Freq: Once | ORAL | Status: DC

## 2023-06-24 MED ORDER — AMOXICILLIN-POT CLAVULANATE 875-125 MG PO TABS
1.0000 | ORAL_TABLET | Freq: Two times a day (BID) | ORAL | 0 refills | Status: AC
  Filled 2023-06-24: qty 14, 7d supply, fill #0

## 2023-06-24 MED ORDER — OXYCODONE HCL 5 MG PO TABS
5.0000 mg | ORAL_TABLET | Freq: Once | ORAL | Status: AC
  Administered 2023-06-24: 5 mg via ORAL
  Filled 2023-06-24: qty 1

## 2023-06-24 MED ORDER — OXYCODONE HCL 5 MG/5ML PO SOLN: 5.0000 mg | Freq: Once | ORAL | Status: AC

## 2023-06-24 NOTE — Discharge Instructions (Addendum)
 Michael Chambers,  You were in the hospital with an infected bone in your toe. You had a surgery to remove this toe and will continue on antibiotics on discharge. Please follow-up with your podiatrist next week. You can also follow-up with your PCP for a hospital follow-up.

## 2023-06-24 NOTE — Discharge Summary (Addendum)
 Physician Discharge Summary   Patient: Michael Chambers MRN: [id number] DOB: Oct 06, 1960  Admit date:     06/22/2023  Discharge date: 06/24/23  Discharge Physician: Jacquelin Hawking, MD   PCP: Golden Pop, FNP   Recommendations at discharge:  Podiatry visit for hospital follow-up and post-op wound care PCP visit for hospital follow-up  Discharge Diagnoses: Principal Problem:   Osteomyelitis of second toe of left foot [facility]) Active Problems:   Peripheral arterial disease (HCC)   Type 2 diabetes mellitus (HCC)   Diabetic foot infection (HCC)   Obesity, class 1   Osteomyelitis (HCC)  Resolved Problems:   * No resolved hospital problems. *  Hospital Course: Michael Chambers is a 63 y.o. male with a history of depression, CAD, PAD, diabetes mellitus type 2, GERD .  Patient presented secondary to the ED at the request of his podiatrist for left second toe osteomyelitis. Antibiotics started. Patient underwent a 2nd ray amputation on 3/28 and was placed on Augmentin for discharge. Patient to follow-up with podiatry for post-op care.  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 Patient presented from podiatrist's office for 2nd ray amputation. Empiric antibiotics started on admission. Patient has a mild chronic leukocytosis with WBC of 12 which is stable. Patient underwent 2nd ray amputation and was transitioned to Augmentin on discharge.   CAD History of PCI without stent placement.   Diabetes mellitus type 2 Not well controlled with last hemoglobin A1C of 7.6%. Patient is on Jardiance as an outpatient. Continue home regimen.   Peripheral neuropathy Continue gabapentin   Depression Patient is no longer taking Cymbalta.  PAD History of left femoral-popliteal bypass graft/stent. Continue aspirin and Plavix.   Consultants: Podiatry Procedures performed: 3/28: Amputation of 2nd toe at MPJ level, left foot   Disposition:  Home Diet recommendation: Carb modified diet   DISCHARGE MEDICATION: Allergies as of 06/24/2023       Reactions   Dimetapp Cold-allergy [brompheniramine-phenylephrine]    hives   Tylenol [acetaminophen] Nausea And Vomiting   Brompheniramine Hives   Chlorpheniramine Hives   Dextromethorphan Hives   Ibuprofen Nausea And Vomiting   Ivp Dye [iodinated Contrast Media] Itching        Medication List     TAKE these medications    amoxicillin-clavulanate 875-125 MG tablet Commonly known as: AUGMENTIN Take 1 tablet by mouth 2 (two) times daily for 7 days.   aspirin EC 81 MG tablet Take 1 tablet (81 mg total) by mouth daily at 6 (six) AM. Swallow whole.   clopidogrel 75 MG tablet Commonly known as: PLAVIX Take 1 tablet (75 mg total) by mouth daily at 6 (six) AM.   gabapentin 100 MG capsule Commonly known as: NEURONTIN Take 100 mg by mouth 3 (three) times daily.   HYDROcodone-acetaminophen 5-325 MG tablet Commonly known as: NORCO/VICODIN Take 1 tablet by mouth in the morning, at noon, and at bedtime.   Jardiance 25 MG Tabs tablet Generic drug: empagliflozin Take 25 mg by mouth daily.   lisinopril 10 MG tablet Commonly known as: ZESTRIL Take 2 tablets (20 mg total) by mouth daily. What changed: how much to take   rosuvastatin 20 MG tablet Commonly known as: Crestor Take 1 tablet (20 mg total) by mouth at bedtime.        Follow-up Information     Golden Pop, FNP. Schedule an appointment as soon as possible for a visit in 1 week(s).  Specialty: Family Medicine Contact information: [address] [place] [address] [phone]         Pilar Plate, DPM. Schedule an appointment as soon as possible for a visit in 1 week(s).   Specialty: Podiatry Why: For wound re-check, For hospital follow-up Contact information: [address] [place] [address] [phone]                Discharge Exam: BP 133/80 (BP  Location: Left Arm)   Pulse 71   Temp 97.6 F (36.4 C) (Oral)   Resp 16   Ht 5\' 9"  (1.753 m)   Wt 102 kg   SpO2 97%   BMI 33.21 kg/m   General exam: Appears calm and comfortable Respiratory system: Clear to auscultation. Respiratory effort normal. Cardiovascular system: S1 & S2 heard, Normal rate with regular rhythm. Gastrointestinal system: Abdomen is nondistended, soft and nontender. Normal bowel sounds heard. Central nervous system: Alert and oriented.  Musculoskeletal: Left foot in surgical dressing Psychiatry: Judgement and insight appear normal. Mood & affect appropriate.   Condition at discharge: stable  The results of significant diagnostics from this hospitalization (including imaging, microbiology, ancillary and laboratory) are listed below for reference.   Imaging Studies: DG Foot 2 Views Left Result Date: 06/23/2023 CLINICAL DATA:  Postoperative status of left foot. EXAM: LEFT FOOT - 2 VIEW COMPARISON:  June 22, 2023. FINDINGS: Interval surgical amputation of phalanges of second toe. Previous amputation involving distal portion of first metatarsal from phalanges is again noted. Moderate posterior calcaneal spurring is noted. No definite lytic destruction is seen at this time. IMPRESSION: Interval surgical amputation of second toe phalanges. Electronically Signed   By: Lupita Raider M.D.   On: 06/23/2023 19:31   DG Foot Complete Left Result Date: 06/22/2023 Please see detailed radiograph report in office note.   Microbiology: Results for orders placed or performed during the hospital encounter of 06/22/23  Blood culture (routine x 2)     Status: None (Preliminary result)   Collection Time: 06/22/23  4:07 PM   Specimen: BLOOD LEFT ARM  Result Value Ref Range Status   Specimen Description BLOOD LEFT ARM  Final   Special Requests   Final    BOTTLES DRAWN AEROBIC AND ANAEROBIC Blood Culture adequate volume   Culture   Final    NO GROWTH 2 DAYS Performed at [facility], [address] [place], [place] [address]    Report Status PENDING  Incomplete  Blood culture (routine x 2)     Status: None (Preliminary result)   Collection Time: 06/22/23  4:50 PM   Specimen: BLOOD LEFT ARM  Result Value Ref Range Status   Specimen Description BLOOD LEFT ARM  Final   Special Requests   Final    BOTTLES DRAWN AEROBIC AND ANAEROBIC Blood Culture results may not be optimal due to an inadequate volume of blood received in culture bottles   Culture   Final    NO GROWTH 2 DAYS Performed at [facility], [address], [place], [place] [address]    Report Status PENDING  Incomplete  Surgical pcr screen     Status: None   Collection Time: 06/23/23  7:36 AM   Specimen: Nasal Mucosa; Nasal Swab  Result Value Ref Range Status   MRSA, PCR NEGATIVE NEGATIVE Final   Staphylococcus aureus NEGATIVE NEGATIVE Final    Comment: (NOTE) The Xpert SA Assay (FDA approved for NASAL specimens in patients  91 years of age and older), is one component of a comprehensive surveillance program. It is not intended to diagnose infection nor to guide or monitor treatment. Performed at [facility], [address], [place], [place] [address]   Aerobic/Anaerobic Culture w Gram Stain (surgical/deep wound)     Status: None (Preliminary result)   Collection Time: 06/23/23  3:03 PM   Specimen: Foot, Left; Tissue  Result Value Ref Range Status   Specimen Description BONE  Final   Special Requests left second toe  Final   Gram Stain NO WBC SEEN RARE GRAM POSITIVE COCCI   Final   Culture   Final    CULTURE REINCUBATED FOR BETTER GROWTH Performed at [facility], [address], [place], [place] [address]    Report Status PENDING  Incomplete    Labs: CBC: Recent Labs  Lab 06/22/23 1607  WBC 12.0*  NEUTROABS 8.7*  HGB 13.2  HCT 40.9  MCV 87.2  PLT 302   Basic Metabolic Panel: Recent Labs  Lab 06/22/23 1607  NA 137  K 3.8  CL 104  CO2 22   GLUCOSE 104*  BUN 14  CREATININE 1.09  CALCIUM 9.1   Liver Function Tests: Recent Labs  Lab 06/22/23 1607  AST 17  ALT 24  ALKPHOS 76  BILITOT 0.5  PROT 6.8  ALBUMIN 3.6   CBG: Recent Labs  Lab 06/23/23 1354 06/23/23 1527 06/23/23 1638 06/23/23 2245 06/24/23 1136  GLUCAP 123* 88 79 143* 148*    Discharge time spent: 35 minutes.  Signed: Jacquelin Hawking, MD [facility] 06/24/2023
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
## 2023-06-26 ENCOUNTER — Encounter (HOSPITAL_COMMUNITY): Payer: Self-pay | Admitting: Podiatry

## 2023-06-26 ENCOUNTER — Ambulatory Visit (HOSPITAL_COMMUNITY): Payer: 59

## 2023-06-26 ENCOUNTER — Ambulatory Visit: Payer: 59

## 2023-06-26 NOTE — Anesthesia Postprocedure Evaluation (Signed)
 Anesthesia Post Note  Patient: Michael Chambers  Procedure(s) Performed: AMPUTATION, TOE (Left: Toe)     Patient location during evaluation: PACU Anesthesia Type: MAC Level of consciousness: awake Pain management: pain level controlled Vital Signs Assessment: post-procedure vital signs reviewed and stable Respiratory status: spontaneous breathing, nonlabored ventilation and respiratory function stable Cardiovascular status: blood pressure returned to baseline and stable Postop Assessment: no apparent nausea or vomiting Anesthetic complications: no   No notable events documented.  Last Vitals:  Vitals:   06/24/23 0437 06/24/23 0751  BP: 102/80 133/80  Pulse:  71  Resp:  16  Temp: 36.7 C 36.4 C  SpO2: 99% 97%    Last Pain:  Vitals:   06/24/23 0933  TempSrc:   PainSc: 8                  [name]

## 2023-06-27 LAB — CULTURE, BLOOD (ROUTINE X 2)
Culture: NO GROWTH
Culture: NO GROWTH
Special Requests: ADEQUATE

## 2023-06-27 LAB — SURGICAL PATHOLOGY

## 2023-06-28 LAB — AEROBIC/ANAEROBIC CULTURE W GRAM STAIN (SURGICAL/DEEP WOUND): Gram Stain: NONE SEEN

## 2023-06-29 ENCOUNTER — Ambulatory Visit (INDEPENDENT_AMBULATORY_CARE_PROVIDER_SITE_OTHER): Admitting: Podiatry

## 2023-06-29 DIAGNOSIS — M869 Osteomyelitis, unspecified: Secondary | ICD-10-CM

## 2023-06-29 DIAGNOSIS — Z89422 Acquired absence of other left toe(s): Secondary | ICD-10-CM
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
## 2023-07-06 ENCOUNTER — Ambulatory Visit (INDEPENDENT_AMBULATORY_CARE_PROVIDER_SITE_OTHER): Admitting: Podiatry

## 2023-07-06 DIAGNOSIS — M869 Osteomyelitis, unspecified: Secondary | ICD-10-CM

## 2023-07-06 DIAGNOSIS — Z89422 Acquired absence of other left toe(s): Secondary | ICD-10-CM

## 2023-07-06 DIAGNOSIS — Z9889 Other specified postprocedural states: Secondary | ICD-10-CM
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
## 2023-07-13 DIAGNOSIS — Z299 Encounter for prophylactic measures, unspecified: Secondary | ICD-10-CM | POA: Diagnosis not present

## 2023-07-13 DIAGNOSIS — I10 Essential (primary) hypertension: Secondary | ICD-10-CM | POA: Diagnosis not present

## 2023-07-13 DIAGNOSIS — M549 Dorsalgia, unspecified: Secondary | ICD-10-CM | POA: Diagnosis not present

## 2023-07-13 DIAGNOSIS — Z89412 Acquired absence of left great toe: Secondary | ICD-10-CM | POA: Diagnosis not present

## 2023-07-20 ENCOUNTER — Encounter: Payer: Self-pay | Admitting: Podiatry

## 2023-07-20 ENCOUNTER — Ambulatory Visit (INDEPENDENT_AMBULATORY_CARE_PROVIDER_SITE_OTHER): Admitting: Podiatry

## 2023-07-20 DIAGNOSIS — Z89422 Acquired absence of other left toe(s): Secondary | ICD-10-CM

## 2023-07-20 DIAGNOSIS — E1142 Type 2 diabetes mellitus with diabetic polyneuropathy: Secondary | ICD-10-CM
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
## 2023-07-31 ENCOUNTER — Ambulatory Visit

## 2023-07-31 ENCOUNTER — Ambulatory Visit (HOSPITAL_COMMUNITY): Attending: Physician Assistant

## 2023-07-31 ENCOUNTER — Ambulatory Visit (HOSPITAL_COMMUNITY)
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
## 2023-08-10 DIAGNOSIS — M549 Dorsalgia, unspecified: Secondary | ICD-10-CM | POA: Diagnosis not present

## 2023-08-10 DIAGNOSIS — E1140 Type 2 diabetes mellitus with diabetic neuropathy, unspecified: Secondary | ICD-10-CM | POA: Diagnosis not present

## 2023-08-10 DIAGNOSIS — Z299 Encounter for prophylactic measures, unspecified: Secondary | ICD-10-CM | POA: Diagnosis not present

## 2023-08-10 DIAGNOSIS — E1169 Type 2 diabetes mellitus with other specified complication: Secondary | ICD-10-CM | POA: Diagnosis not present

## 2023-08-10 DIAGNOSIS — I70362 Atherosclerosis of unspecified type of bypass graft(s) of the extremities with gangrene, left leg: Secondary | ICD-10-CM | POA: Diagnosis not present

## 2023-08-10 DIAGNOSIS — G5692 Unspecified mononeuropathy of left upper limb: Secondary | ICD-10-CM | POA: Diagnosis not present

## 2023-08-10 DIAGNOSIS — I10 Essential (primary) hypertension: Secondary | ICD-10-CM | POA: Diagnosis not present

## 2023-08-11 ENCOUNTER — Other Ambulatory Visit (HOSPITAL_COMMUNITY): Payer: Self-pay

## 2023-08-18 ENCOUNTER — Other Ambulatory Visit: Payer: Self-pay | Admitting: Physician Assistant

## 2023-08-18 DIAGNOSIS — I739 Peripheral vascular disease, unspecified: Secondary | ICD-10-CM

## 2023-08-22 DIAGNOSIS — G5691 Unspecified mononeuropathy of right upper limb: Secondary | ICD-10-CM | POA: Diagnosis not present

## 2023-08-22 DIAGNOSIS — E1169 Type 2 diabetes mellitus with other specified complication: Secondary | ICD-10-CM | POA: Diagnosis not present

## 2023-08-22 DIAGNOSIS — G5692 Unspecified mononeuropathy of left upper limb: Secondary | ICD-10-CM | POA: Diagnosis not present

## 2023-09-04 ENCOUNTER — Ambulatory Visit (HOSPITAL_COMMUNITY)
Admission: RE | Admit: 2023-09-04 | Discharge: 2023-09-04 | Disposition: A | Discharge status: Home / Self Care | Source: Ambulatory Visit | Attending: Surgery | Admitting: Surgery

## 2023-09-04 ENCOUNTER — Ambulatory Visit (HOSPITAL_COMMUNITY)
Admission: RE | Admit: 2023-09-04 | Discharge: 2023-09-04 | Disposition: A | Discharge status: Home / Self Care | Source: Ambulatory Visit | Attending: Physician Assistant

## 2023-09-04 ENCOUNTER — Ambulatory Visit: Attending: Surgery | Admitting: Physician Assistant

## 2023-09-04 VITALS — BP 136/91 | HR 105 | Temp 98.3°F | Ht 69.0 in | Wt 194.9 lb

## 2023-09-04 DIAGNOSIS — I739 Peripheral vascular disease, unspecified: Secondary | ICD-10-CM

## 2023-09-04 DIAGNOSIS — Z9889 Other specified postprocedural states: Secondary | ICD-10-CM | POA: Diagnosis not present

## 2023-09-04 DIAGNOSIS — I7025 Atherosclerosis of native arteries of other extremities with ulceration: Secondary | ICD-10-CM | POA: Diagnosis not present

## 2023-09-04 DIAGNOSIS — L97509 Non-pressure chronic ulcer of other part of unspecified foot with unspecified severity: Secondary | ICD-10-CM

## 2023-09-04 LAB — VAS US ABI WITH/WO TBI
Left ABI: 1.27
Right ABI: 0.89
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
## 2023-09-06 ENCOUNTER — Other Ambulatory Visit: Payer: Self-pay

## 2023-09-06 DIAGNOSIS — L97509 Non-pressure chronic ulcer of other part of unspecified foot with unspecified severity: Secondary | ICD-10-CM

## 2023-09-12 DIAGNOSIS — E1165 Type 2 diabetes mellitus with hyperglycemia: Secondary | ICD-10-CM | POA: Diagnosis not present

## 2023-09-12 DIAGNOSIS — M549 Dorsalgia, unspecified: Secondary | ICD-10-CM | POA: Diagnosis not present

## 2023-09-12 DIAGNOSIS — Z299 Encounter for prophylactic measures, unspecified: Secondary | ICD-10-CM | POA: Diagnosis not present

## 2023-09-12 DIAGNOSIS — I10 Essential (primary) hypertension: Secondary | ICD-10-CM | POA: Diagnosis not present

## 2023-09-12 DIAGNOSIS — I70362 Atherosclerosis of unspecified type of bypass graft(s) of the extremities with gangrene, left leg: Secondary | ICD-10-CM | POA: Diagnosis not present

## 2023-10-10 DIAGNOSIS — Z299 Encounter for prophylactic measures, unspecified: Secondary | ICD-10-CM | POA: Diagnosis not present

## 2023-10-10 DIAGNOSIS — M549 Dorsalgia, unspecified: Secondary | ICD-10-CM | POA: Diagnosis not present

## 2023-10-10 DIAGNOSIS — E1165 Type 2 diabetes mellitus with hyperglycemia: Secondary | ICD-10-CM | POA: Diagnosis not present

## 2023-10-10 DIAGNOSIS — I10 Essential (primary) hypertension: Secondary | ICD-10-CM | POA: Diagnosis not present

## 2023-10-10 DIAGNOSIS — N183 Chronic kidney disease, stage 3 unspecified: Secondary | ICD-10-CM | POA: Diagnosis not present

## 2023-10-10 DIAGNOSIS — I70362 Atherosclerosis of unspecified type of bypass graft(s) of the extremities with gangrene, left leg: Secondary | ICD-10-CM | POA: Diagnosis not present

## 2023-10-23 DIAGNOSIS — M1611 Unilateral primary osteoarthritis, right hip: Secondary | ICD-10-CM | POA: Diagnosis not present

## 2023-11-08 DIAGNOSIS — I10 Essential (primary) hypertension: Secondary | ICD-10-CM | POA: Diagnosis not present

## 2023-11-08 DIAGNOSIS — E1169 Type 2 diabetes mellitus with other specified complication: Secondary | ICD-10-CM | POA: Diagnosis not present

## 2023-11-08 DIAGNOSIS — M549 Dorsalgia, unspecified: Secondary | ICD-10-CM | POA: Diagnosis not present

## 2023-11-08 DIAGNOSIS — M25559 Pain in unspecified hip: Secondary | ICD-10-CM | POA: Diagnosis not present

## 2023-11-08 DIAGNOSIS — Z299 Encounter for prophylactic measures, unspecified: Secondary | ICD-10-CM | POA: Diagnosis not present

## 2023-11-20 DIAGNOSIS — Z89422 Acquired absence of other left toe(s): Secondary | ICD-10-CM | POA: Diagnosis not present

## 2023-11-20 DIAGNOSIS — L84 Corns and callosities: Secondary | ICD-10-CM | POA: Diagnosis not present

## 2023-11-20 DIAGNOSIS — Z89412 Acquired absence of left great toe: Secondary | ICD-10-CM | POA: Diagnosis not present

## 2023-11-20 DIAGNOSIS — E1142 Type 2 diabetes mellitus with diabetic polyneuropathy: Secondary | ICD-10-CM | POA: Diagnosis not present

## 2023-12-11 DIAGNOSIS — I10 Essential (primary) hypertension: Secondary | ICD-10-CM | POA: Diagnosis not present

## 2023-12-11 DIAGNOSIS — N183 Chronic kidney disease, stage 3 unspecified: Secondary | ICD-10-CM | POA: Diagnosis not present

## 2023-12-11 DIAGNOSIS — E1169 Type 2 diabetes mellitus with other specified complication: Secondary | ICD-10-CM | POA: Diagnosis not present

## 2023-12-11 DIAGNOSIS — M549 Dorsalgia, unspecified: Secondary | ICD-10-CM | POA: Diagnosis not present

## 2023-12-11 DIAGNOSIS — G8194 Hemiplegia, unspecified affecting left nondominant side: Secondary | ICD-10-CM | POA: Diagnosis not present

## 2023-12-11 DIAGNOSIS — Z299 Encounter for prophylactic measures, unspecified: Secondary | ICD-10-CM | POA: Diagnosis not present

## 2023-12-11 DIAGNOSIS — E1140 Type 2 diabetes mellitus with diabetic neuropathy, unspecified: Secondary | ICD-10-CM | POA: Diagnosis not present

## 2024-01-10 DIAGNOSIS — E119 Type 2 diabetes mellitus without complications: Secondary | ICD-10-CM | POA: Diagnosis not present

## 2024-01-10 DIAGNOSIS — Z299 Encounter for prophylactic measures, unspecified: Secondary | ICD-10-CM | POA: Diagnosis not present

## 2024-01-10 DIAGNOSIS — I63239 Cerebral infarction due to unspecified occlusion or stenosis of unspecified carotid arteries: Secondary | ICD-10-CM | POA: Diagnosis not present

## 2024-01-10 DIAGNOSIS — E1140 Type 2 diabetes mellitus with diabetic neuropathy, unspecified: Secondary | ICD-10-CM | POA: Diagnosis not present

## 2024-01-10 DIAGNOSIS — Z79899 Other long term (current) drug therapy: Secondary | ICD-10-CM | POA: Diagnosis not present

## 2024-01-10 DIAGNOSIS — M549 Dorsalgia, unspecified: Secondary | ICD-10-CM | POA: Diagnosis not present

## 2024-01-10 DIAGNOSIS — L602 Onychogryphosis: Secondary | ICD-10-CM | POA: Diagnosis not present

## 2024-01-10 DIAGNOSIS — E1122 Type 2 diabetes mellitus with diabetic chronic kidney disease: Secondary | ICD-10-CM | POA: Diagnosis not present

## 2024-01-10 DIAGNOSIS — I10 Essential (primary) hypertension: Secondary | ICD-10-CM | POA: Diagnosis not present

## 2024-03-04 ENCOUNTER — Ambulatory Visit (HOSPITAL_COMMUNITY)

## 2024-03-04 ENCOUNTER — Ambulatory Visit

## 2024-04-29 ENCOUNTER — Ambulatory Visit (HOSPITAL_COMMUNITY)

## 2024-04-29 ENCOUNTER — Ambulatory Visit

## 2024-06-17 ENCOUNTER — Ambulatory Visit

## 2024-06-17 ENCOUNTER — Ambulatory Visit (HOSPITAL_COMMUNITY)
